# Patient Record
Sex: Female | Born: 1940 | Race: Black or African American | Hispanic: No | Marital: Married | Smoking: Former smoker
Health system: Southern US, Community
[De-identification: ages and names within clinical notes are randomized; demographics above are authoritative.]

## PROBLEM LIST (undated history)

## (undated) DIAGNOSIS — K5903 Drug induced constipation: Secondary | ICD-10-CM

## (undated) DIAGNOSIS — R768 Other specified abnormal immunological findings in serum: Secondary | ICD-10-CM

## (undated) DIAGNOSIS — J8410 Pulmonary fibrosis, unspecified: Secondary | ICD-10-CM

## (undated) DIAGNOSIS — K828 Other specified diseases of gallbladder: Secondary | ICD-10-CM

## (undated) DIAGNOSIS — I10 Essential (primary) hypertension: Secondary | ICD-10-CM

## (undated) DIAGNOSIS — J302 Other seasonal allergic rhinitis: Secondary | ICD-10-CM

## (undated) DIAGNOSIS — J210 Acute bronchiolitis due to respiratory syncytial virus: Secondary | ICD-10-CM

## (undated) DIAGNOSIS — K219 Gastro-esophageal reflux disease without esophagitis: Secondary | ICD-10-CM

## (undated) DIAGNOSIS — E785 Hyperlipidemia, unspecified: Secondary | ICD-10-CM

## (undated) DIAGNOSIS — E119 Type 2 diabetes mellitus without complications: Secondary | ICD-10-CM

## (undated) HISTORY — PX: COLONOSCOPY: SHX174

## (undated) HISTORY — PX: ABDOMINAL HYSTERECTOMY: SHX81

## (undated) HISTORY — PX: SMALL INTESTINE SURGERY: SHX150

## (undated) HISTORY — DX: Essential (primary) hypertension: I10

## (undated) HISTORY — PX: HERNIA REPAIR: SHX51

## (undated) HISTORY — DX: Type 2 diabetes mellitus without complications: E11.9

---

## 1997-05-26 ENCOUNTER — Encounter: Admission: RE | Admit: 1997-05-26 | Discharge: 1997-08-24 | Payer: Self-pay | Admitting: *Deleted

## 1998-07-21 ENCOUNTER — Ambulatory Visit (HOSPITAL_COMMUNITY): Admission: RE | Admit: 1998-07-21 | Discharge: 1998-07-21 | Payer: Self-pay | Admitting: Obstetrics and Gynecology

## 1998-11-23 ENCOUNTER — Other Ambulatory Visit: Admission: RE | Admit: 1998-11-23 | Discharge: 1998-11-23 | Payer: Self-pay | Admitting: *Deleted

## 1999-08-24 ENCOUNTER — Encounter: Payer: Self-pay | Admitting: Emergency Medicine

## 1999-08-24 ENCOUNTER — Emergency Department (HOSPITAL_COMMUNITY): Admission: EM | Admit: 1999-08-24 | Discharge: 1999-08-24 | Payer: Self-pay | Admitting: Emergency Medicine

## 2000-01-24 ENCOUNTER — Other Ambulatory Visit: Admission: RE | Admit: 2000-01-24 | Discharge: 2000-01-24 | Payer: Self-pay | Admitting: *Deleted

## 2001-01-06 ENCOUNTER — Other Ambulatory Visit: Admission: RE | Admit: 2001-01-06 | Discharge: 2001-01-06 | Payer: Self-pay | Admitting: *Deleted

## 2002-02-01 ENCOUNTER — Other Ambulatory Visit: Admission: RE | Admit: 2002-02-01 | Discharge: 2002-02-01 | Payer: Self-pay | Admitting: *Deleted

## 2003-03-15 ENCOUNTER — Ambulatory Visit (HOSPITAL_COMMUNITY): Admission: RE | Admit: 2003-03-15 | Discharge: 2003-03-15 | Payer: Self-pay | Admitting: Gastroenterology

## 2003-08-08 ENCOUNTER — Ambulatory Visit (HOSPITAL_COMMUNITY): Admission: RE | Admit: 2003-08-08 | Discharge: 2003-08-08 | Payer: Self-pay | Admitting: Gastroenterology

## 2004-08-21 ENCOUNTER — Emergency Department (HOSPITAL_COMMUNITY): Admission: EM | Admit: 2004-08-21 | Discharge: 2004-08-21 | Payer: Self-pay | Admitting: Emergency Medicine

## 2005-01-07 ENCOUNTER — Ambulatory Visit (HOSPITAL_COMMUNITY): Admission: RE | Admit: 2005-01-07 | Discharge: 2005-01-07 | Payer: Self-pay | Admitting: Gastroenterology

## 2005-03-07 ENCOUNTER — Ambulatory Visit (HOSPITAL_BASED_OUTPATIENT_CLINIC_OR_DEPARTMENT_OTHER): Admission: RE | Admit: 2005-03-07 | Discharge: 2005-03-07 | Payer: Self-pay | Admitting: Orthopedic Surgery

## 2005-06-26 ENCOUNTER — Encounter: Payer: Self-pay | Admitting: Obstetrics and Gynecology

## 2006-04-07 ENCOUNTER — Emergency Department (HOSPITAL_COMMUNITY): Admission: EM | Admit: 2006-04-07 | Discharge: 2006-04-07 | Payer: Self-pay | Admitting: Emergency Medicine

## 2006-04-08 ENCOUNTER — Inpatient Hospital Stay (HOSPITAL_COMMUNITY): Admission: EM | Admit: 2006-04-08 | Discharge: 2006-04-27 | Payer: Self-pay | Admitting: Emergency Medicine

## 2006-08-20 ENCOUNTER — Ambulatory Visit (HOSPITAL_COMMUNITY): Admission: RE | Admit: 2006-08-20 | Discharge: 2006-08-20 | Payer: Self-pay | Admitting: Obstetrics & Gynecology

## 2006-09-23 ENCOUNTER — Emergency Department (HOSPITAL_COMMUNITY): Admission: EM | Admit: 2006-09-23 | Discharge: 2006-09-23 | Payer: Self-pay | Admitting: Emergency Medicine

## 2006-09-28 ENCOUNTER — Emergency Department (HOSPITAL_COMMUNITY): Admission: EM | Admit: 2006-09-28 | Discharge: 2006-09-28 | Payer: Self-pay | Admitting: Emergency Medicine

## 2007-05-21 ENCOUNTER — Encounter: Admission: RE | Admit: 2007-05-21 | Discharge: 2007-05-21 | Payer: Self-pay | Admitting: General Surgery

## 2007-09-08 ENCOUNTER — Encounter (INDEPENDENT_AMBULATORY_CARE_PROVIDER_SITE_OTHER): Payer: Self-pay | Admitting: General Surgery

## 2007-09-09 ENCOUNTER — Inpatient Hospital Stay (HOSPITAL_COMMUNITY): Admission: AD | Admit: 2007-09-09 | Discharge: 2007-09-10 | Payer: Self-pay | Admitting: General Surgery

## 2008-01-14 ENCOUNTER — Ambulatory Visit (HOSPITAL_COMMUNITY): Admission: RE | Admit: 2008-01-14 | Discharge: 2008-01-14 | Payer: Self-pay | Admitting: Gastroenterology

## 2008-01-20 ENCOUNTER — Emergency Department (HOSPITAL_COMMUNITY): Admission: EM | Admit: 2008-01-20 | Discharge: 2008-01-20 | Payer: Self-pay | Admitting: Emergency Medicine

## 2009-12-01 ENCOUNTER — Encounter: Admission: RE | Admit: 2009-12-01 | Discharge: 2009-12-01 | Payer: Self-pay | Admitting: Sports Medicine

## 2010-01-12 ENCOUNTER — Encounter: Payer: Self-pay | Admitting: Internal Medicine

## 2010-01-12 ENCOUNTER — Ambulatory Visit: Payer: Self-pay | Admitting: Internal Medicine

## 2010-01-12 DIAGNOSIS — R05 Cough: Secondary | ICD-10-CM

## 2010-01-15 ENCOUNTER — Inpatient Hospital Stay (HOSPITAL_COMMUNITY)
Admission: RE | Admit: 2010-01-15 | Discharge: 2010-01-18 | Payer: Self-pay | Source: Home / Self Care | Admitting: General Surgery

## 2010-01-16 DIAGNOSIS — I10 Essential (primary) hypertension: Secondary | ICD-10-CM

## 2010-01-16 DIAGNOSIS — E118 Type 2 diabetes mellitus with unspecified complications: Secondary | ICD-10-CM | POA: Insufficient documentation

## 2010-01-21 ENCOUNTER — Inpatient Hospital Stay (HOSPITAL_COMMUNITY): Admission: EM | Admit: 2010-01-21 | Discharge: 2010-01-26 | Payer: Self-pay | Admitting: Emergency Medicine

## 2010-03-18 ENCOUNTER — Encounter: Payer: Self-pay | Admitting: Obstetrics & Gynecology

## 2010-03-29 NOTE — Assessment & Plan Note (Signed)
Summary: CHRONIC COUGH X 6 MONTHS //KP   Primary Kim [name]/Referring Kim [name]:  Pati [name], MD  CC:  Pulmonary Consult-cough; Dr.Kramer.  History of Present Illness: January 12, 2010- 70 yoF referred courtesy of Dr Kim [name] because of persistent cough. She describes this as bothersome for "2 weeks or longer", but on review of Dr Kim [name] notes, he described duration as 6 months or more. An office note from March indicates recent congestion and cough then. She describes cough as intermittent but persistent, with no recognized trigger. Nothing makes it worse.. Denies sputum, dyspnea, chest pain, wheeze. It rarely wakes her. Cough drops help some.  Had some asthma as a child- not severe. Smoked over 30 pack years, stopping in 1996. Spirometry in office today- variable effort. FVC 1.7/ 60%; FEV1/ 64%; FEV1/FVC 0.82 Moderate Restriction.  Preventive Screening-Counseling & Management  Alcohol-Tobacco     Smoking Status: quit     Packs/Day: 1.0     Year Started: 1960     Year Quit: 1996  Current Medications (verified): 1)  Hyzaar 100-12.5 Mg Tabs (Losartan Potassium-Hctz) .... Take 1 By Mouth Once Daily 2)  Metformin Hcl 500 Mg Tabs (Metformin Hcl) .... Take 1 By Mouth Two Times A Day 3)  Fish Oil 1000 Mg Caps (Omega-3 Fatty Acids) .... Take 1 By Mouth Two Times A Day 4)  Vitamin D3 1000 Unit Caps (Cholecalciferol) .... Take 1 By Mouth Two Times A Day 5)  Gas Relief Extra Strength 125 Mg Caps (Simethicone) .... Take 1 By Mouth Once Daily As Needed 6)  Clearlax  Powd (Polyethylene Glycol 3350) .Marland Kitchen.. 1 Capful in H2o/beverage Once Daily  Allergies (verified): 1)  ! Codeine 2)  ! Pcn  Past History:  Past Medical History: Cough Diabetes, Type 2 Hypertension abdominal wall hernias  Past Surgical History: bowel obstruction  2008 Total Abdominal Hysterectomy  Family History: Family hx: heart disease and asthma Father- died- drowned Mother- died-? 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  Smoking Status:  quit Packs/Day:  1.0  Review of Systems       The patient complains of weight change.  The patient denies shortness of breath with activity, shortness of breath at rest, productive cough, non-productive cough, coughing up blood, chest pain, irregular heartbeats, acid heartburn, indigestion, loss of appetite, abdominal pain, difficulty swallowing, sore throat, tooth/dental problems, headaches, nasal congestion/difficulty breathing through nose, sneezing, itching, ear ache, anxiety, depression, hand/feet swelling, joint stiffness or pain, rash, change in color of mucus, and fever.    Vital Signs:  Patient profile:   70 year old female Height:      68 inches Weight:      189.38 pounds BMI:     28.90 O2 Sat:      92 % on Room air Pulse rate:   81 / minute BP sitting:   140 / 76  (left arm) Cuff size:   regular  Vitals Entered By: Reynaldo Minium CMA (January 12, 2010 3:19 PM)  O2 Flow:  Room air CC: Pulmonary Consult-cough; Dr.Kramer   Physical Exam  Additional Exam:  General: A/Ox3; pleasant and cooperative, NAD, room air O2 sat 92% SKIN: no rash, lesions NODES: no lymphadenopathy HEENT: [place]/AT, EOM- WNL, Conjuctivae- clear, PERRLA, TM-WNL, Nose- clear, Throat- clear and wnl, own teeth, Mallampati  II NECK: Supple w/ fair ROM, JVD- none, normal carotid impulses w/o bruits Thyroid- normal to palpation CHEST: Dull in bases, poor airflow, unlabored, no cough HEART: RRR,  no m/g/r heard ABDOMEN:  ZOX:WRUE, nl pulses, no edema, cyanosis or clubbing  NEURO: Grossly intact to observation      Impression & Recommendations:  Problem # 1:  COUGH (ICD-786.2)  Significant past smoking hx. On exam she seems quite dull in bases, but I don't know if this might reflect her several abdominal hernia surgeries. We will get office spirometry and CXR this afternoon, then let her go with  a rescue inhaler to try, returning in 2 weeks for f/u.  She seems comfortable now, and she is not actively coughing. The usual first concerns with chronic cough are postnasal drip, reflux or aspiration, intrinsic lung disease including COPD or other tobacco related disease in this fomer heavy smoker. Cough does not seem to have progressed over recent months, but I note she is vague about how long it [name] been going on.   Medications Added to Medication List This Visit: 1)  Hyzaar 100-12.5 Mg Tabs (Losartan potassium-hctz) .... Take 1 by mouth once daily 2)  Metformin Hcl 500 Mg Tabs (Metformin hcl) .... Take 1 by mouth two times a day 3)  Fish Oil 1000 Mg Caps (Omega-3 fatty acids) .... Take 1 by mouth two times a day 4)  Vitamin D3 1000 Unit Caps (Cholecalciferol) .... Take 1 by mouth two times a day 5)  Gas Relief Extra Strength 125 Mg Caps (Simethicone) .... Take 1 by mouth once daily as needed 6)  Clearlax Powd (Polyethylene glycol 3350) .Marland Kitchen.. 1 capful in h2o/beverage once daily  Other Orders: Consultation Level IV (45409) Spirometry w/Graph (94010) T-2 View CXR (71020TC)  Patient Instructions: 1)  Please schedule a follow-up appointment in 2 weeks. 2)  CC Dr Kim [name] 3)  A chest x-ray [name] been recommended.  Your imaging study may require preauthorization.  4)  Office spirometry 5)  Sample rescue inhaler for trial this weekend to see if it helps your cough 6)  2 puffs, up to 4 x daily if needed 7)  You can try an otc cough syrup, like Delsym 8)  ................................................................................. 9)  DG CHEST 2 VIEW - [id number] 10)    11)  Clinical Data: Shortness of breath, cough. 12)    13)  CHEST - 2 VIEW 14)    15)  Comparison: 09/08/2007 and 04/19/2006.CT abdomen pelvis 12/01/2009. 16)    17)  Findings: Trachea is midline.  Heart size normal.  There is a 18)  peripheral pattern of increased density and coarsening, similar to 19)  09/08/2007.  No  pleural fluid. 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 25)    26)  Read By:  Reyes Ivan.,  M.D.     27)  Released By:  Reyes Ivan.,  M.D. 28)  ______________

## 2010-05-08 LAB — BASIC METABOLIC PANEL
BUN: 3 mg/dL — ABNORMAL LOW (ref 6–23)
BUN: 4 mg/dL — ABNORMAL LOW (ref 6–23)
BUN: 4 mg/dL — ABNORMAL LOW (ref 6–23)
CO2: 28 mEq/L (ref 19–32)
CO2: 30 mEq/L (ref 19–32)
Calcium: 8.3 mg/dL — ABNORMAL LOW (ref 8.4–10.5)
Calcium: 8.4 mg/dL (ref 8.4–10.5)
Chloride: 102 mEq/L (ref 96–112)
Chloride: 110 mEq/L (ref 96–112)
Creatinine, Ser: 0.76 mg/dL (ref 0.4–1.2)
Creatinine, Ser: 0.79 mg/dL (ref 0.4–1.2)
Creatinine, Ser: 0.84 mg/dL (ref 0.4–1.2)
Creatinine, Ser: 0.86 mg/dL (ref 0.4–1.2)
GFR calc Af Amer: >60 mL/min (ref >60)
GFR calc Af Amer: >60 mL/min (ref >60)
GFR calc non Af Amer: >60 mL/min (ref >60)
GFR calc non Af Amer: >60 mL/min (ref >60)
GFR calc non Af Amer: >60 mL/min (ref >60)
Glucose, Bld: 138 mg/dL — ABNORMAL HIGH (ref 70–99)
Potassium: 3.7 mEq/L (ref 3.5–5.1)
Potassium: 4.2 mEq/L (ref 3.5–5.1)
Sodium: 136 mEq/L (ref 135–145)
Sodium: 137 mEq/L (ref 135–145)

## 2010-05-08 LAB — DIFFERENTIAL
Basophils Absolute: 0 10*3/uL (ref 0.0–0.1)
Basophils Absolute: 0.1 10*3/uL (ref 0.0–0.1)
Basophils Absolute: 0.1 10*3/uL (ref 0.0–0.1)
Basophils Relative: 0 % (ref 0–1)
Lymphocytes Relative: 26 % (ref 12–46)
Lymphocytes Relative: 29 % (ref 12–46)
Lymphocytes Relative: 40 % (ref 12–46)
Monocytes Absolute: 0.9 10*3/uL (ref 0.1–1.0)
Neutro Abs: 6.9 10*3/uL (ref 1.7–7.7)
Neutro Abs: 9 10*3/uL — ABNORMAL HIGH (ref 1.7–7.7)
Neutrophils Relative %: 50 % (ref 43–77)

## 2010-05-08 LAB — CBC
MCH: 26 pg (ref 26.0–34.0)
MCH: 26.4 pg (ref 26.0–34.0)
MCH: 26.7 pg (ref 26.0–34.0)
MCHC: 32.9 g/dL (ref 30.0–36.0)
MCV: 79.4 fL (ref 78.0–100.0)
MCV: 80.3 fL (ref 78.0–100.0)
MCV: 80.9 fL (ref 78.0–100.0)
Platelets: 292 10*3/uL (ref 150–400)
Platelets: 404 10*3/uL — ABNORMAL HIGH (ref 150–400)
Platelets: 406 10*3/uL — ABNORMAL HIGH (ref 150–400)
Platelets: 431 10*3/uL — ABNORMAL HIGH (ref 150–400)
RBC: 3.94 MIL/uL (ref 3.87–5.11)
RBC: 4.26 MIL/uL (ref 3.87–5.11)
RBC: 4.74 MIL/uL (ref 3.87–5.11)
RBC: 4.99 MIL/uL (ref 3.87–5.11)
RDW: 14.5 % (ref 11.5–15.5)
RDW: 14.8 % (ref 11.5–15.5)
RDW: 14.8 % (ref 11.5–15.5)
RDW: 14.8 % (ref 11.5–15.5)
WBC: 10 10*3/uL (ref 4.0–10.5)
WBC: 11.4 10*3/uL — ABNORMAL HIGH (ref 4.0–10.5)
WBC: 11.8 10*3/uL — ABNORMAL HIGH (ref 4.0–10.5)
WBC: 9.2 10*3/uL (ref 4.0–10.5)

## 2010-05-08 LAB — GLUCOSE, CAPILLARY
Glucose-Capillary: 111 mg/dL — ABNORMAL HIGH (ref 70–99)
Glucose-Capillary: 125 mg/dL — ABNORMAL HIGH (ref 70–99)
Glucose-Capillary: 127 mg/dL — ABNORMAL HIGH (ref 70–99)
Glucose-Capillary: 137 mg/dL — ABNORMAL HIGH (ref 70–99)
Glucose-Capillary: 141 mg/dL — ABNORMAL HIGH (ref 70–99)
Glucose-Capillary: 143 mg/dL — ABNORMAL HIGH (ref 70–99)
Glucose-Capillary: 98 mg/dL (ref 70–99)

## 2010-05-08 LAB — COMPREHENSIVE METABOLIC PANEL
AST: 17 U/L (ref 0–37)
Albumin: 3.4 g/dL — ABNORMAL LOW (ref 3.5–5.2)
Albumin: 3.8 g/dL (ref 3.5–5.2)
Alkaline Phosphatase: 60 U/L (ref 39–117)
BUN: 7 mg/dL (ref 6–23)
BUN: 9 mg/dL (ref 6–23)
CO2: 30 mEq/L (ref 19–32)
Chloride: 101 mEq/L (ref 96–112)
Chloride: 99 mEq/L (ref 96–112)
Creatinine, Ser: 0.88 mg/dL (ref 0.4–1.2)
Creatinine, Ser: 0.91 mg/dL (ref 0.4–1.2)
GFR calc non Af Amer: >60 mL/min (ref >60)
Glucose, Bld: 93 mg/dL (ref 70–99)
Sodium: 139 mEq/L (ref 135–145)
Total Bilirubin: 0.4 mg/dL (ref 0.3–1.2)
Total Bilirubin: 1.1 mg/dL (ref 0.3–1.2)
Total Protein: 7.6 g/dL (ref 6.0–8.3)

## 2010-05-08 LAB — URINE MICROSCOPIC-ADD ON

## 2010-05-08 LAB — URINALYSIS, ROUTINE W REFLEX MICROSCOPIC
Bilirubin Urine: NEGATIVE
Nitrite: NEGATIVE
Specific Gravity, Urine: 1.017 (ref 1.005–1.030)
pH: 7.5 (ref 5.0–8.0)

## 2010-05-08 LAB — SURGICAL PCR SCREEN: Staphylococcus aureus: NEGATIVE

## 2010-07-10 NOTE — Op Note (Signed)
**Note Kim via Obfuscation** [name], [name]              ACCOUNT NO.:  [id number]   MEDICAL RECORD NO.:  [id number]          PATIENT TYPE:  OIB   LOCATION:  0098                         FACILITY:  [facility]   PHYSICIAN:  Anselm Pancoast. Weatherly, M.D.DATE OF BIRTH:  June 10, 1940   DATE OF PROCEDURE:  09/08/2007  DATE OF DISCHARGE:                               OPERATIVE REPORT   PREOPERATIVE DIAGNOSIS:  Incisional hernia.   POSTOPERATIVE DIAGNOSIS:  Incisional hernia.   OPERATION:  Repair of incisional hernia with Proceed mesh.   ANESTHESIA:  General anesthesia.   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  She was seen in the  office on August 11, 2007, and said for about 3-4 months she had noticed a  little discomfort in the upper abdomen and a swelling of the upper part  of her incision.  She had been operated through a midline incision.  The  bottom appeared to be intact.  She had, had a CT back in April that did  not show any problem with the exception of an incisional hernia.  She  recently had been visiting in [place] with a lot of car riding, and said  her symptoms were much more prominent when she was constipated.  She did  not have any blood in her stool.  She has an obvious bulge in the upper  midportion of her incision, and I think we need proceed on with repair  this with mesh if possible.  Preoperatively, she was given 1 gm of  Ancef.  Her laboratory studies were normal.   She had PAS stockings and after induction of general anesthesia, a Foley  catheter was inserted.  I prepped the abdomen and draped in a sterile  manner.  Upon opening the midline incision, there was about a teaspoon  of kind of whitish fluid.  There was no odor.  We got a Gram stain of it  and sent if for aerobic and also a stat Gram stain, and there was no  white cells and there  was no bacteria noted.  I went ahead and proceeded  on with the dissection, opening into the peritoneal cavity.  There were  adhesions to the undersurface of this area.  There was a lemon sized  hernia defect that was separated, and then there was a couple little  defects around the umbilicus and one just below the umbilicus that were  not appreciated on physical exam.  With the Gram stain not showing any  bacteria or white cells, I think it will be alright to go ahead and put  a piece of Proceed mesh, and I used the 8 x 15 cm size sutured up under  the fascia circumferentially with interrupted sutures of 0 Prolene, and  then closed the fascia over the mesh, picking up a few fibers of the  mesh in the midportion.  This was done with #1 Novofil, and I also used  #1 Prolene interrupted.  I had tried to get good hemostasis during the  dissection.  I did put in a 10 mL Blake drain and closed the  subcutaneous tissue with 2-0 Vicryl and then the skin closed with  staples.  The patient tolerated the procedure nicely.  I am going to  keep her n.p.o. today except for ice chips since she has a history of  getting nauseous, and then hopefully will be able to start her on a  liquid diet tomorrow.  The sponge and needle counts were correct, and  the patient was awake in the recovery room at the time of this  dictation.          ______________________________  Anselm Pancoast. Zachery Dakins, M.D.    WJW/MEDQ  D:  09/08/2007  T:  09/08/2007  Job:  [id number]

## 2010-07-10 NOTE — Discharge Summary (Signed)
[name], [name]              ACCOUNT NO.:  [id number]   MEDICAL RECORD NO.:  [id number]          PATIENT TYPE:  INP   LOCATION:  5706                         FACILITY:  [facility]   PHYSICIAN:  Leonie Man, M.D.   DATE OF BIRTH:  11-21-1940   DATE OF ADMISSION:  04/08/2006  DATE OF DISCHARGE:  04/27/2006                               DISCHARGE SUMMARY   OPERATIVE PHYSICIAN:  Anselm Pancoast. Zachery Dakins, M.D.   PRIMARY CARE PHYSICIAN:  Estell Harpin, M.D.   CHIEF COMPLAINT:  Abdominal pain with nausea and vomiting.   HISTORY OF PRESENT ILLNESS AND REASON FOR ADMISSION:  Kim [name] is a  70 year old female patient with a history of prior abdominal surgeries  who presented to the ER 24 hours prior with complaints of abdominal  pain, nausea and vomiting that awakened her from sleep.  Workup at that  time included ultrasound of the abdomen which showed no significant  abnormalities.  Her labs were normal.  A plain abdominal film obtained  at the same time showed nonspecific small bowel gas pattern with air in  the colon, so she was discharged home.  Unfortunately, her symptoms  continued through the night with increased pain and increasing nausea  and vomiting and unable to even keep liquids down.  She presented back  to the ER.  Subsequently, a CT scan was obtained, and this demonstrated  small-bowel obstruction in the right abdominal region with a small  amount of free fluid in the pelvis.   PHYSICAL EXAMINATION:  GENERAL:  The patient's vital signs were stable.  ABDOMEN:  Her abdomen was obese but tympanitic and distended with no  bowel sounds auscultated and diffuse mild tenderness without guarding or  rebounding.   LABORATORY DATA:  Within normal limits.  Her white count was 9700, but  her neutrophils were elevated at 80%.  Urinalysis was consistent with a  possible UTI.  Her x-rays and CT scans were reviewed, and it was felt  that the patient and did have symptomatic  small-bowel obstruction.   She was admitted with a following diagnoses.  1. Small bowel obstruction.  2. Volume depletion secondary to nausea and vomiting.  3. History of prior open abdominal surgeries 20 years prior.  4. Hypertension.   HOSPITAL COURSE:  The patient was admitted to the general floor where  she was placed on n.p.o. status, NG tube, bowel rest and IV fluid  hydration.  Twenty-four hours after admission, an x-ray was performed  that showed decrease in colonic gas and no change in small bowel  obstruction.  After much discussion with Dr. Zachery Dakins, it was felt that  the patient's bowel obstruction was actually worsening, and he felt she  would benefit from operative intervention.   On April 10, 2006, the patient was taken to the OR by Dr. Zachery Dakins  where she underwent exploratory laparotomy with lysis of adhesions for  small bowel obstruction.  She was sent back to the floor to recover.   After the first few days of surgery, the patient continued to have  symptoms consistent with a postoperative ileus.  She  had a mild  elevation in the white count to 13,000; otherwise, labs were stable.  Her abdomen was soft and distended, but no bowel sounds were present.  She was tolerating her PCA morphine, and her wound was clean, dry and  intact.  The NG continued with thick bilious returns.   On postoperative day #3, the patient had not been passing any flatus,  but during the evening her NG tube had been inadvertently pulled I think  by the patient, so it was left out.  By postoperative day #4, her  abdomen was distended, but she did have bowel sounds.  Her wound was  clean, dry and intact.  I felt that she may be finally opening up  postoperatively, so sips of clears were allowed and ambulation was  allowed.   By postoperative day #5, the patient was still not passing flatus, and  she began experiencing more nausea despite having bowel sounds.  She  continued to  ambulate.  Her abdomen was slightly softer, but she had  higher-pitched bowel sounds and again was not passing any flatus or  bowel movements.  By postoperative day #6, her plain films showed an  improvement of her small bowel obstruction with only a very few small  air-fluid levels in the left upper quadrant.  Her abdomen was soft.  She  had bowel sounds, but they were diminished, so it was felt that maybe  her postoperative ileus was improving, so a trial of full liquids were  initiated.  Unfortunately, she began having emesis, even though she was  passing stools.  On postoperative day #7 after the emesis, her bowel  sounds were much louder and active today.  Her wound staples were  discontinued.  A trial on soft diet was initiated, and she was restarted  on her home antihypertensive medications.  Over the next several days,  she continued to be equivocal in whether her ileus was resolving.  She  continued to have bowel sounds, but she also had some fullness in the  left lower quadrant.  She was tender.  She was tried on a Dulcolax  suppository, but continued to develop nausea and vomiting after attempts  to initiate diet.   By postoperative day #9, she really was not taking any meaningful  nutrition in, so a PICC line was placed and she was started on TNA.  Around this same time, the patient began to have difficulty with  hypokalemia, so IV repletion of potassium was initiated.  Obviously,  this would be influencing any postoperative ileus problems.   By postoperative day #11, she reported having small bowel movements the  day before.  She was complaining of being hungry.  She was stable.  An  NG tube had been in place with amber returns less than 500 in 24 hours.  The NG tube had been reinserted several days before, and I neglected to  mention that.  She continued to have signs consistent with a prolonged  postoperative ileus, so no attempts were made at this point to clamp the NG  tube or advance her diet.  Over the next several days, clinically and  x-ray-wise, she continued to improve.  By postoperative day #13, she had  marked decrease in small bowel air, and air fluid levels were decreased.  She also had some retained stool and contrast in the right colon.  By  this point, we attempted to clamp the NG tube again and allow sips of  clears.  Her  PCA was discontinued, and she was placed on p.r.n. Dilaudid  and low-dose Reglan IV and was also given another Dulcolax suppository.  By postoperative day #14, she was passing flatus and a bowel movement.  She was doing well, sitting up in the chair.  She was tolerating her NG  tube clamping.  Her abdomen was soft.  Bowel sounds were present.  Her  abdomen was less distended.  Her midline incision was clean, dry, and  intact with a tiny open area at the umbilical area which was  granulating, and she was still requiring TNA.  At this point, her NG  tube was discontinued and she was placed on a full clear liquid diet  with plans to advance her diet further in the morning if she tolerated.   By postoperative day #15, she was afebrile, vital signs were stable.  She was up in the chair, stated she felt much better.  Her abdomen was  soft, nondistended.  Bowel sounds were present.  She was advanced to a  soft diet.  Plans were to advance her to a soft diet in the morning if  she tolerated the full liquids that were started that morning.  She was  resumed on her oral pain pills, her normal dose of Avapro for  hypertension, and her TNA was being weaned.   By postoperative day #17, the patient was tolerating a regular diet, was  passing flatus and having bowel movements, was having no abdominal pain  and was otherwise deemed appropriate for discharge home.   FINAL DISCHARGE DIAGNOSES:  1. Small bowel obstruction secondary to adhesions.  2. Status post exploratory laparotomy with lysis of adhesions by Dr.      Zachery Dakins on  April 10, 2006.  3. Prolonged postoperative ileus, now resolved.  4. Protein calorie malnutrition on TNA.  5. Hypertension, stable.   DISCHARGE MEDICATIONS:  1. Vicodin as needed for pain.  See Dr. Danella Penton prescription.  2. Resume home medication of Micardis/HCTZ 80/12.5 one tablet daily.   DIET:  No restrictions.   WOUND CARE:  Daily showers.  Allow any Steri-Strips to fall off.   ACTIVITY:  Increase activity slowly.  No lifting for 6 weeks more than  15 pounds.   FOLLOWUP:  You need to call Dr. Annette Stable office at [phone] to  arrange an appointment in the next 2-4 weeks.   OTHER INSTRUCTIONS:  You are to call the doctor if your fever is greater  than 101 degrees Fahrenheit, any redness, swelling or drainage from your  incisions or any other problems.      Allison L. Rennis Harding, N.P.      Leonie Man, M.D.  Electronically Signed    ALE/MEDQ  D:  08/07/2006  T:  08/07/2006  Job:  [id number]   cc:   Estell Harpin, M.D.

## 2010-07-10 NOTE — Discharge Summary (Signed)
NAMETAMRAH, VICTORINO              ACCOUNT NO.:  [id number]   MEDICAL RECORD NO.:  [id number]          PATIENT TYPE:  INP   LOCATION:  1620                         [facility]ACILITY:  [facility]   PHYSICIAN:  Anselm Pancoast. Weatherly, M.D.DATE OF BIRTH:  November 15, 1940   DATE OF ADMISSION:  09/08/2007  DATE OF DISCHARGE:  09/10/2007                               DISCHARGE SUMMARY   DISCHARGE DIAGNOSES:  1. Incisional hernia.  2. History of hypertension.   HISTORY:  Kim [name] is a 70 year old female who I first met  approximately 8 months ago when she was admitted to the teaching service  and a surgical consultation was required and she had a small bowel  obstruction secondary to adhesions.  She was taken to the operating room  after failed management with NG suction, etc., and had a lysis of  adhesions and recently she has had increased swelling in the upper part  of her midline incision.  Her bowels have worked satisfactory and when I  saw her in the office in June, she said she had noticed a little bulge  occurred in about 3 months.  She has a history of mild hypertension and  pulmonary fibrosis.  She is not on home oxygen but the cough and etc.,  has made this worse.  The incision appeared to be having no evidence of  any inflammatory type problem and I recommended we repaired this with  mesh.  She was admitted on the 14th for this planned procedure.  Her  laboratory studies preoperatively showed a white count of 7600,  hematocrit was 36, her electrolytes were normal and a chest x-ray showed  pulmonary fibrosis.  She was taken to surgery and the upper half of her  incision had stretched so that the fascia had separated about 3 cm in a  hernia sac.  I was able to free up the bowel not really requiring a full  lysis of adhesions but able to get a piece of Proceed mesh under the  fascia and enclosed.  At the time of the incision opened, there was a  little area in the subcutaneous tissue that had  kind of liquefied fluid.  It had no odor.  I got a culture of this and sent it for stat Gram stain  that showed no fluid and it looked like it was a small area of fat  necrosis probably from her previous incision.  I elected to go ahead and  proceed with a Proceed mesh since there was no organism seen and now at  24 hours there is still no organisms noted on the growth.  Her incisions  healing nicely.  Had a little mild Blake drain in the incision but it  has had minimal drainage and I have removed the drain.  I think she can  be discharged at this time as she is tolerating a diet, incision looks  good and she will gradually advance her liquid diet over the next 48  hours and use Milk of Magnesia.  She had a bowel prep preoperatively so  that there is very little stool in her  colon that this time.  I am going  to discharge on Keflex 5 mg q.6 hours for 5 days and we will see her in  the office middle of next week.  She has got an abdominal binder she is  wearing and will wait approximately 48 hours before any shower.  She  continues her blood pressure medications.           ______________________________  Anselm Pancoast. Zachery Dakins, M.D.    WJW/MEDQ  D:  09/10/2007  T:  09/10/2007  Job:  [id number]   cc:   Anselm Pancoast. Zachery Dakins, M.D.  [address], [address]  [place]  [place] [address]

## 2010-07-13 NOTE — Op Note (Signed)
NAME:  Kim [name], Kim [name]                        ACCOUNT NO.:  [id number]   MEDICAL RECORD NO.:  [id number]                   PATIENT TYPE:  AMB   LOCATION:  ENDO                                 FACILITY:  [facility]   PHYSICIAN:  John C. Madilyn Fireman, M.D.                 DATE OF BIRTH:  October 05, 1940   DATE OF PROCEDURE:  03/15/2003  DATE OF DISCHARGE:                                 OPERATIVE REPORT   PROCEDURE:  Colonoscopy.   INDICATION FOR PROCEDURE:  Average risk colon cancer screening.   DESCRIPTION OF PROCEDURE:  The patient was placed in the left lateral  decubitus position and placed on the pulse monitor with continuous low-flow  oxygen delivered by nasal cannula.  She was sedated with 75 mcg IV fentanyl  and 8 mg IV Versed.  The Olympus video colonoscope was inserted into the  rectum and advanced its entire length but despite multiple abdominal  pressure and maneuvers, torquing maneuvers, and change in position, I was  unable to reach the cecum.  It was not certain but felt that the point of  most proximal advancement was probably somewhere in the mid transverse  colon.  This portion of the colon as well as the descending, sigmoid, and  rectum appeared normal with no masses, polyps, diverticula, or other mucosal  abnormalities.  The rectum, likewise appeared normal, and retroflexed view  of the anus revealed no obvious internal hemorrhoids.  The scope was then  withdrawn, and the patient returned to the recovery room in stable  condition.  She tolerated the procedure well, and there were no immediate  complications.   IMPRESSION:  Normal limited exam to the estimated mid transverse colon.   PLAN:  We will follow up with barium enema.                                               John C. Madilyn Fireman, M.D.    JCH/MEDQ  D:  03/15/2003  T:  03/15/2003  Job:  [id number]   cc:   Estell Harpin, M.D.  [address]  [place] [address]  Fax: [phone]

## 2010-07-13 NOTE — Op Note (Signed)
NAMECLARENCE, Kim [name]              ACCOUNT NO.:  [id number]   MEDICAL RECORD NO.:  [id number]          PATIENT TYPE:  AMB   LOCATION:  DSC                          FACILITY:  [facility]   PHYSICIAN:  Loreta Ave, M.D. 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   OPERATION PERFORMED:  Correction of hallux valgus and metatarsus prima vara  with removal of exostosis and distal first metatarsal chevron osteotomy,  fixation with 2 mm bioabsorbable OrthoSorb pin.  Medial capsular reefing.  Debridement MP joint.   SURGEON:  Loreta Ave, M.D.   ANESTHESIA:  General.   ESTIMATED BLOOD LOSS:  Minimal.   TOURNIQUET TIME:  50 minutes.   SPECIMENS:  None.   CULTURES:  None.   COMPLICATIONS:  None.   DRESSING:  Soft compressive with wooden shoes.   DESCRIPTION OF PROCEDURE:  The patient was brought to the operating room and  after adequate anesthesia had been obtained, tourniquet applied to left  calf.  Prepped and draped in the usual sterile fashion.  Exsanguinated with  elevation and Esmarch.  Tourniquet inflated to 250 mmHg.  Longitudinal  incision in the medial aspect, first MP joint.  Skin and subcutaneous tissue  divided.  Distal based capsular flap developed, exposing exostosis in the  metacarpophalangeal joint.  Grade 2, mild grade 3 changes.  Joint debrided.  Exostosis removed with a saw with fluoroscopic guidance.  Distal chevron  osteotomy created in the metaphyseal region.  Distal aspect of the  metatarsal slid laterally 5 mm, fixed with a bioabsorbable 2 mm pin from  dorsal to  plantar.  Excellent correction.  Remaining prominent shaft tapered  smoothly.  Overall correction excellent, confirmed visually and  fluoroscopically.  Wound  irrigated.  Medial capsule was reefed with interrupted 0 Vicryl.  Wound  irrigated and closed with nylon.  Sterile compressive dressing with bolster  between first and second toes applied.  Ace wrap applied.  Tourniquet  deflated.  Anesthesia reversed.  Brought to recovery room.  Tolerated  surgery well without complication.      Loreta Ave, M.D.  Electronically Signed     DFM/MEDQ  D:  03/07/2005  T:  03/08/2005  Job:  [id number]

## 2010-07-13 NOTE — Op Note (Signed)
Kim [name], Kim [name]              ACCOUNT NO.:  [id number]   MEDICAL RECORD NO.:  [id number]          PATIENT TYPE:  INP   LOCATION:  5727                         FACILITY:  [facility]   PHYSICIAN:  Anselm Pancoast. Weatherly, M.D.DATE OF BIRTH:  1940/11/27   DATE OF PROCEDURE:  04/10/2006  DATE OF DISCHARGE:                               OPERATIVE REPORT   PREOPERATIVE DIAGNOSIS:  Small bowel obstruction probably secondary to  adhesions.   POSTOPERATIVE DIAGNOSIS:  Small-bowel obstruction secondary to adhesions  from previous abdominal hysterectomy   ANESTHESIA:  General anesthesia.   OPERATION:  Exploratory laparotomy with lysis of adhesions for small  bowel obstruction.   SURGEON:  Anselm Pancoast. Zachery Dakins, M.D.   ASSISTANT:  Gabrielle Dare. Janee Morn, M.D.   HISTORY:  Kim [name] is a 70 year old female who was admitted 2 days  ago through the emergency room with a several-day history of bloating  cramping.  She had had abdominal hysterectomy secondary to cervical  cancer approximately 20 years ago and on the April 07, 2006, she  presented with nausea, vomiting and ultrasound in the emergency room  showed no evidence of any gallstones since her pain was more in the  upper right side.  She was discharged, continued to have nausea and  vomiting and then returned the following day, April 08, 2006, and  general surgical consult was requested and we saw the patient.  She has  a past history of hypertension and she has had a colonoscopy in 2005, by  Dr. Dorena Cookey and on examination she was not acutely ill, but kind of  gave pain in the upper right abdomen and on the CT she definitely did  have some dilated loops of small bowel in this location but it was not  that of an obvious go to the OR now type of obstruction.  NG tube was  placed and this is working satisfactory with bilious NG drainage.  She  was not more distended the following day but still having bilious NG  drainage and then  this morning a repeat x-ray still showed the dilated  loop of small bowel in the right upper quadrant and she, to me, appeared  to be actually having a little more pain.  I recommended we go ahead and  proceed with laparotomy and the patient was in agreement.   Preoperatively, she is allergic to penicillin, she was given 4 mg Cipro  taken to the operative suite, has PAS stockings.  The abdomen was  prepped with Betadine solution and draped in a sterile manner after the  Foley catheter was inserted sterilely and then draped in a sterile  manner.  A made a small incision up above the umbilicus.  She had had a  lower midline incision up to above the umbilicus and she is fairly short  and heavy and appears to be a little larger than her weight of about 200  pounds.  I carefully opened into the peritoneal cavity and the small  bowel was adherent to the anterior abdominal wall as high as the  umbilicus and it looked as if there was  no omentum to kind of go under  the midline incision with her hysterectomy had been done.  We extended  the incision up and also down since you really could not tell exactly  where the track point was going to be and there was a little area of  bowel that was very densely adherent right above the umbilicus which  would have been right at the top part of her incision.  However, this  did not appear to be the actual point of blockage and we started lysis  of adhesions.  There were numerous areas and there was this dilated loop  of bowel that was mildly congested more in the right upper quadrant that  has been seen on the x-rays and this lead down into the pelvis.  The  patient's incision was then extended inferior so we could actually  approached the adhesions in the pelvis and there were two areas that  possibly could have been the point, there was an area to the right up  above the actual cecum and terminal ileum that was densely adherent and  this was where the loop  of bowel that was very congested actually led to  before it looped back up into the abdomen and then the most terminal  ileum actually drops down into the pelvis and freeing up this particular  loop was very difficult but questioned whether she could possibly have  had a little obturator hernia or whatever and we went ahead and, with  tedious dissection, actually freed that area up but I do not think that  that was the actual point of obstruction.  I think the area that was  right above it was the point that was given the clinical findings.  Then  with this freed up, you could run the bowel from the cecum to the  ligament of Treitz.  There was no other areas of dense adhesions and  everything was completely freed up.  I then, with a retractor in the  upper abdomen, freed up, it appeared that the omentum was stuck to the  undersurface of the liver as if probably when they were doing they  hysterectomy they had had her head down and it just stayed in this  location and this was freed up.  You could see the gallbladder which was  decompressed and no stones, then the omentum would come down over all of  the small bowel.  We then ran the small bowel again and placed it back  in anatomical position, starting at the distally end kind of working up  to the upper abdomen and then brought the omentum over it.  We then  closed the incision with a double looped #1 PDS and tying the two ends  together in the midline.  The skin was closed with staples.  She has got  about 2 inches of adipose tissue and the NG tube, we had checked its  position and it is in the stomach and working nicely.  She will be  continued on NG suction and IV fluids post anesthesia and will give her  antibiotics for approximately 2 days and hopefully her bowels will start  working, but I am sure it will be in several days since there was a lot  of manipulation in freeing up these chronic pelvic adhesions.           ______________________________  Anselm Pancoast. Zachery Dakins, M.D.     WJW/MEDQ  D:  04/10/2006  T:  04/10/2006  Job:  [id number]

## 2010-07-13 NOTE — H&P (Signed)
Kim [name], Kim [name]              ACCOUNT NO.:  [id number]   MEDICAL RECORD NO.:  [id number]          PATIENT TYPE:  INP   LOCATION:  1827                         FACILITY:  [facility]   PHYSICIAN:  Anselm Pancoast. Weatherly, M.D.DATE OF BIRTH:  Aug 06, 1940   DATE OF ADMISSION:  04/08/2006  DATE OF DISCHARGE:                              HISTORY & PHYSICAL   Primary care physician is Dr. Pati [name].   CHIEF COMPLAINT:  Abdominal pain with associated nausea and vomiting.   HISTORY OF PRESENT ILLNESS:  Kim [name] is a 70 year old female  patient, prior history of exploratory laparotomy for a total abdominal  hysterectomy secondary to cervical cancer 20 years ago.  She presented  to the ER on April 07, 2006, with complaints of abdominal pain and  nausea and vomiting that woke her up at 1 a.m. on the 11th.  Ultrasound  done in the ER showed no significant abnormalities to explain her pain  and nausea and vomiting.  Her labs were normal.  Plain abdominal film  showed a nonspecific bowel gas pattern but also noted there was air in  the colon.  The patient was eventually discharged home.  Unfortunately,  her symptoms continued throughout the night, increasing pain, increasing  nausea and vomiting, unable to keep any liquids down.  She presented  back to the ER.  Once again labs were normal but a CT scan was obtained,  and this showed a small bowel obstruction mainly in the right abdominal  region, also a small amount of fluid in the right pelvis.  She is still  very nauseated.  Because of these findings a surgical admission has been  requested.   REVIEW OF SYSTEMS:  The patient is reporting chills.  No fevers.  Significant nausea and vomiting over the past 48 hours.  No BM for 2  days.  No suprapubic pain.  No dysuria.  She has never had any symptoms  like this in the past.   SOCIAL HISTORY:  No alcohol.  No tobacco.  She is retired.   FAMILY HISTORY:  Gallstones.   PAST MEDICAL  HISTORY:  Hypertension.   PAST SURGICAL HISTORY:  1. Total abdominal hysterectomy due to cervical cancer 20 years ago.  2. Foot surgery in 2007.  3. Colonoscopy which was normal in  2005 by Dr. Madilyn Fireman.   ALLERGIES:  CODEINE and PENICILLIN, which causes a rash.   CURRENT MEDICATIONS:  She is all one blood pressure pill.  She cannot  recall the name of it.   PHYSICAL EXAMINATION:  GENERAL:  A pleasant female patient complaining  of significant nausea and abdominal distension.  VITAL SIGNS:  Temperature 97.1, BP 136/72, pulse 97 and regular,  respirations 20.  NEUROLOGIC:  The patient is alert and oriented x3, moving all  extremities x4, without focal deficits.  HEENT: Head normocephalic.  Sclerae are noninjected.  NECK:  Supple.  No adenopathy.  CHEST:  Bilateral lung sounds clear to auscultation.  Respiratory effort  nonlabored.  She is on room air.  CARDIAC:  S1-S2.  No rubs, murmurs, thrills or gallops.  IV fluids are  infusing.  ABDOMEN:  Obese but tympanitic and distended.  There are no bowel sounds  auscultated.  She has diffuse mild tenderness without guarding or  rebounding.  EXTREMITIES:  Symmetrical in appearance without edema, cyanosis or  clubbing.  Pulses are palpable.   LABORATORY DATA:  Sodium 136, potassium 4.1, CO2 30, BUN 15, creatinine  0.9, glucose 169.  LFTs are normal.  Amylase and lipase are normal.  White count 9700, neutrophils 80%, hemoglobin 15.7, platelets 347,000.  Urinalysis shows few epithelials, leukocyte esterase is negative, wbc's  3-6, rbc's 0-2, bacteria many.   DIAGNOSTICS:  Plain abdominal x-ray April 07, 2006:  Nonspecific  bowel gas pattern.  Ultrasound of the abdomen April 07, 2006:  Adherent gallstones versus an adenomyomatosis, otherwise unremarkable.  CT on April 08, 2006, shows a small bowel obstruction involving the  right abdomen region, small amount of fluid in the right pelvis, no  other significant abnormalities noted.   Multiple surgical clips in the  midpelvis.   IMPRESSION:  1. Small bowel obstruction.  2. Volume depletion.  3. Prior exploratory laparotomy and hysterectomy 20 years ago.  4. History of cervical cancer.  5. Hypertension.   PLAN:  1. Admit the patient to general surgical floor.  Bowel rest with      n.p.o. status, NG tube, IV fluids, Zofran and morphine for pain and      nausea symptoms.  2. Follow-up two-view abdominal x-ray in the morning and serially.      Will watch the patient clinically and hopefully the bowel      obstruction will resolve without need for surgical intervention.      Allison L. Rennis Harding, N.P.    ______________________________  Anselm Pancoast. Zachery Dakins, M.D.    ALE/MEDQ  D:  04/08/2006  T:  04/08/2006  Job:  [id number]   cc:   Estell Harpin, M.D.

## 2010-11-22 LAB — COMPREHENSIVE METABOLIC PANEL
ALT: 10
Alkaline Phosphatase: 59
BUN: 13
CO2: 29
Chloride: 104
GFR calc non Af Amer: 60 — ABNORMAL LOW
Glucose, Bld: 125 — ABNORMAL HIGH
Potassium: 4.8
Sodium: 140
Total Bilirubin: 0.6
Total Protein: 7.3

## 2010-11-22 LAB — WOUND CULTURE: Gram Stain: NONE SEEN

## 2010-11-22 LAB — DIFFERENTIAL
Basophils Absolute: 0
Basophils Relative: 1
Eosinophils Absolute: 0.3
Neutro Abs: 3.6
Neutrophils Relative %: 47

## 2010-11-22 LAB — CBC
HCT: 36.2
Hemoglobin: 11.9 — ABNORMAL LOW
RBC: 4.54
RDW: 14.9

## 2010-11-22 LAB — GRAM STAIN: Gram Stain: NONE SEEN

## 2012-01-01 ENCOUNTER — Ambulatory Visit (INDEPENDENT_AMBULATORY_CARE_PROVIDER_SITE_OTHER): Payer: Medicare Other | Admitting: Family

## 2012-01-01 ENCOUNTER — Encounter: Payer: Self-pay | Admitting: Family

## 2012-01-01 VITALS — BP 130/68 | HR 100 | Temp 98.2°F | Ht 67.0 in | Wt 178.0 lb

## 2012-01-01 DIAGNOSIS — I10 Essential (primary) hypertension: Secondary | ICD-10-CM

## 2012-01-01 DIAGNOSIS — J00 Acute nasopharyngitis [common cold]: Secondary | ICD-10-CM

## 2012-01-01 DIAGNOSIS — E119 Type 2 diabetes mellitus without complications: Secondary | ICD-10-CM

## 2012-01-01 MED ORDER — METHYLPREDNISOLONE 4 MG PO KIT: PACK | ORAL | Status: AC

## 2012-01-01 NOTE — Progress Notes (Signed)
  Subjective:    Patient ID: Kim [name], female    DOB: [birth year], 71 y.o.   MRN: [id number]  HPI 71 year old non-smoking African American female initial visit seen for complaints of having a cold. Pt has a history of Type II diabetes and hypertension. Pt reports having a recent cold, but post nasal drip persist.  Pt reports checking  BP and blood glucose daily but could not remember results.    Review of Systems  Constitutional: Negative.   HENT: Positive for rhinorrhea.   Eyes: Negative.   Respiratory: Negative.   Cardiovascular: Negative.   Gastrointestinal: Negative.   Genitourinary: Negative.   Musculoskeletal: Negative.   Skin: Negative.   Neurological: Negative.   Hematological: Negative.   Psychiatric/Behavioral: Negative.    Past Medical History  Diagnosis Date  . Diabetes mellitus without complication   . Hypertension     History   Social History  . Marital Status: Married    Spouse Name: N/A    Number of Children: N/A  . Years of Education: N/A   Occupational History  . Not on file.   Social History Main Topics  . Smoking status: Former Games developer  . Smokeless tobacco: Not on file  . Alcohol Use: No  . Drug Use: No  . Sexually Active:    Other Topics Concern  . Not on file   Social History Narrative  . No narrative on file    Past Surgical History  Procedure Date  . Abdominal hysterectomy   . Hernia repair   . Small intestine surgery     No family history on file.  Allergies  Allergen Reactions  . Codeine   . Penicillins     Current Outpatient Prescriptions on File Prior to Visit  Medication Sig Dispense Refill  . losartan-hydrochlorothiazide (HYZAAR) 100-12.5 MG per tablet Take 1 tablet by mouth daily.      . metFORMIN (GLUCOPHAGE) 500 MG tablet Take 500 mg by mouth 2 (two) times daily with a meal.        BP 130/68  Pulse 100  Temp 98.2 F (36.8 C) (Oral)  Ht 5\' 7"  (1.702 m)  Wt 178 lb (80.74 kg)  BMI 27.88 kg/m2  SpO2  97%chart    Objective:   Physical Exam  Constitutional: She is oriented to person, place, and time. She appears well-developed and well-nourished.  HENT:  Head: Normocephalic.  Right Ear: External ear normal.  Left Ear: External ear normal.  Eyes: Pupils are equal, round, and reactive to light.  Neck: Normal range of motion. Neck supple.  Cardiovascular: Normal rate, regular rhythm and normal heart sounds.   Pulmonary/Chest: Effort normal and breath sounds normal.  Abdominal: Soft. Bowel sounds are normal.  Musculoskeletal: Normal range of motion.  Neurological: She is alert and oriented to person, place, and time.  Skin: Skin is warm and dry.  Psychiatric: She has a normal mood and affect. Her behavior is normal. Judgment and thought content normal.          Assessment & Plan:    Assessment: Type II diabetes, Hypertension, Acute Nasopharyngitis   Plan: Refill Metformin 500 mg & Hyzaar 100-12/5, new order for Medrol Dose Pak Obtain records from previous provider. Follow up in 3 months or sooner if needed

## 2012-01-01 NOTE — Patient Instructions (Signed)
Diabetes Meal Planning Guide The diabetes meal planning guide is a tool to help you plan your meals and snacks. It is important for people with diabetes to manage their blood glucose (sugar) levels. Choosing the right foods and the right amounts throughout your day will help control your blood glucose. Eating right can even help you improve your blood pressure and reach or maintain a healthy weight. CARBOHYDRATE COUNTING MADE EASY When you eat carbohydrates, they turn to sugar. This raises your blood glucose level. Counting carbohydrates can help you control this level so you feel better. When you plan your meals by counting carbohydrates, you can have more flexibility in what you eat and balance your medicine with your food intake. Carbohydrate counting simply means adding up the total amount of carbohydrate grams in your meals and snacks. Try to eat about the same amount at each meal. Foods with carbohydrates are listed below. Each portion below is 1 carbohydrate serving or 15 grams of carbohydrates. Ask your dietician how many grams of carbohydrates you should eat at each meal or snack. Grains and Starches  1 slice bread.   English muffin or hotdog/hamburger bun.   cup cold cereal (unsweetened).   cup cooked pasta or rice.   cup starchy vegetables (corn, potatoes, peas, beans, winter squash).  1 tortilla (6 inches).   bagel.  1 waffle or pancake (size of a CD).   cup cooked cereal.  4 to 6 small crackers. *Whole grain is recommended. Fruit  1 cup fresh unsweetened berries, melon, papaya, pineapple.  1 small fresh fruit.   banana or mango.   cup fruit juice (4 oz unsweetened).   cup canned fruit in natural juice or water.  2 tbs dried fruit.  12 to 15 grapes or cherries. Milk and Yogurt  1 cup fat-free or 1% milk.  1 cup soy milk.  6 oz light yogurt with sugar-free sweetener.  6 oz low-fat soy yogurt.  6 oz plain yogurt. Vegetables  1 cup raw or  cup  cooked is counted as 0 carbohydrates or a "free" food.  If you eat 3 or more servings at 1 meal, count them as 1 carbohydrate serving. Other Carbohydrates   oz chips or pretzels.   cup ice cream or frozen yogurt.   cup sherbet or sorbet.  2 inch square cake, no frosting.  1 tbs honey, sugar, jam, jelly, or syrup.  2 small cookies.  3 squares of graham crackers.  3 cups popcorn.  6 crackers.  1 cup broth-based soup.  Count 1 cup casserole or other mixed foods as 2 carbohydrate servings.  Foods with less than 20 calories in a serving may be counted as 0 carbohydrates or a "free" food. You may want to purchase a book or computer software that lists the carbohydrate gram counts of different foods. In addition, the nutrition facts panel on the labels of the foods you eat are a good source of this information. The label will tell you how big the serving size is and the total number of carbohydrate grams you will be eating per serving. Divide this number by 15 to obtain the number of carbohydrate servings in a portion. Remember, 1 carbohydrate serving equals 15 grams of carbohydrate. SERVING SIZES Measuring foods and serving sizes helps you make sure you are getting the right amount of food. The list below tells how big or small some common serving sizes are.  1 oz.........4 stacked dice.  3 oz.........Deck of cards.  1 tsp........Tip   of little finger.  1 tbs......Marland KitchenMarland KitchenThumb.  2 tbs.......Marland KitchenGolf ball.   cup......Marland KitchenHalf of a fist.  1 cup.......Marland KitchenA fist. SAMPLE DIABETES MEAL PLAN Below is a sample meal plan that includes foods from the grain and starches, dairy, vegetable, fruit, and meat groups. A dietician can individualize a meal plan to fit your calorie needs and tell you the number of servings needed from each food group. However, controlling the total amount of carbohydrates in your meal or snack is more important than making sure you include all of the food groups at every  meal. You may interchange carbohydrate containing foods (dairy, starches, and fruits). The meal plan below is an example of a 2000 calorie diet using carbohydrate counting. This meal plan has 17 carbohydrate servings. Breakfast  1 cup oatmeal (2 carb servings).   cup light yogurt (1 carb serving).  1 cup blueberries (1 carb serving).   cup almonds. Snack  1 large apple (2 carb servings).  1 low-fat string cheese stick. Lunch  Chicken breast salad.  1 cup spinach.   cup chopped tomatoes.  2 oz chicken breast, sliced.  2 tbs low-fat [place] dressing.  12 whole-wheat crackers (2 carb servings).  12 to 15 grapes (1 carb serving).  1 cup low-fat milk (1 carb serving). Snack  1 cup carrots.   cup hummus (1 carb serving). Dinner  3 oz broiled salmon.  1 cup brown rice (3 carb servings). Snack  1  cups steamed broccoli (1 carb serving) drizzled with 1 tsp olive oil and lemon juice.  1 cup light pudding (2 carb servings). DIABETES MEAL PLANNING WORKSHEET Your dietician can use this worksheet to help you decide how many servings of foods and what types of foods are right for you.  BREAKFAST Food Group and Servings / Carb Servings Grain/Starches __________________________________ Dairy __________________________________________ Vegetable ______________________________________ Fruit ___________________________________________ Meat __________________________________________ Fat ____________________________________________ LUNCH Food Group and Servings / Carb Servings Grain/Starches ___________________________________ Dairy ___________________________________________ Fruit ____________________________________________ Meat ___________________________________________ Fat _____________________________________________ Laural Golden Food Group and Servings / Carb Servings Grain/Starches ___________________________________ Dairy  ___________________________________________ Fruit ____________________________________________ Meat ___________________________________________ Fat _____________________________________________ SNACKS Food Group and Servings / Carb Servings Grain/Starches ___________________________________ Dairy ___________________________________________ Vegetable _______________________________________ Fruit ____________________________________________ Meat ___________________________________________ Fat _____________________________________________ DAILY TOTALS Starches _________________________ Vegetable ________________________ Fruit ____________________________ Dairy ____________________________ Meat ____________________________ Fat ______________________________ Document Released: 11/08/2004 Document Revised: 05/06/2011 Document Reviewed: 09/19/2008 Exit[employer] Patient Information 2013 [place], [place].   Upper Respiratory Infection, Adult An upper respiratory infection (URI) is also sometimes known as the common cold. The upper respiratory tract includes the nose, sinuses, throat, trachea, and bronchi. Bronchi are the airways leading to the lungs. Most people improve within 1 week, but symptoms can last up to 2 weeks. A residual cough may last even longer.  CAUSES Many different viruses can infect the tissues lining the upper respiratory tract. The tissues become irritated and inflamed and often become very moist. Mucus production is also common. A cold is contagious. You can easily spread the virus to others by oral contact. This includes kissing, sharing a glass, coughing, or sneezing. Touching your mouth or nose and then touching a surface, which is then touched by another person, can also spread the virus. SYMPTOMS  Symptoms typically develop 1 to 3 days after you come in contact with a cold virus. Symptoms vary from person to person. They may include:  Runny nose.  Sneezing.  Nasal  congestion.  Sinus irritation.  Sore throat.  Loss of voice (laryngitis).  Cough.  Fatigue.  Muscle aches.  Loss of  appetite.  Headache.  Low-grade fever. DIAGNOSIS  You might diagnose your own cold based on familiar symptoms, since most people get a cold 2 to 3 times a year. Your caregiver can confirm this based on your exam. Most importantly, your caregiver can check that your symptoms are not due to another disease such as strep throat, sinusitis, pneumonia, asthma, or epiglottitis. Blood tests, throat tests, and X-rays are not necessary to diagnose a common cold, but they may sometimes be helpful in excluding other more serious diseases. Your caregiver will decide if any further tests are required. RISKS AND COMPLICATIONS  You may be at risk for a more severe case of the common cold if you smoke cigarettes, have chronic heart disease (such as heart failure) or lung disease (such as asthma), or if you have a weakened immune system. The very young and very old are also at risk for more serious infections. Bacterial sinusitis, middle ear infections, and bacterial pneumonia can complicate the common cold. The common cold can worsen asthma and chronic obstructive pulmonary disease (COPD). Sometimes, these complications can require emergency medical care and may be life-threatening. PREVENTION  The best way to protect against getting a cold is to practice good hygiene. Avoid oral or hand contact with people with cold symptoms. Wash your hands often if contact occurs. There is no clear evidence that vitamin C, vitamin E, echinacea, or exercise reduces the chance of developing a cold. However, it is always recommended to get plenty of rest and practice good nutrition. TREATMENT  Treatment is directed at relieving symptoms. There is no cure. Antibiotics are not effective, because the infection is caused by a virus, not by bacteria. Treatment may include:  Increased fluid intake. Sports drinks  offer valuable electrolytes, sugars, and fluids.  Breathing heated mist or steam (vaporizer or shower).  Eating chicken soup or other clear broths, and maintaining good nutrition.  Getting plenty of rest.  Using gargles or lozenges for comfort.  Controlling fevers with ibuprofen or acetaminophen as directed by your caregiver.  Increasing usage of your inhaler if you have asthma. Zinc gel and zinc lozenges, taken in the first 24 hours of the common cold, can shorten the duration and lessen the severity of symptoms. Pain medicines may help with fever, muscle aches, and throat pain. A variety of non-prescription medicines are available to treat congestion and runny nose. Your caregiver can make recommendations and may suggest nasal or lung inhalers for other symptoms.  HOME CARE INSTRUCTIONS   Only take over-the-counter or prescription medicines for pain, discomfort, or fever as directed by your caregiver.  Use a warm mist humidifier or inhale steam from a shower to increase air moisture. This may keep secretions moist and make it easier to breathe.  Drink enough water and fluids to keep your urine clear or pale yellow.  Rest as needed.  Return to work when your temperature has returned to normal or as your caregiver advises. You may need to stay home longer to avoid infecting others. You can also use a face mask and careful hand washing to prevent spread of the virus. SEEK MEDICAL CARE IF:   After the first few days, you feel you are getting worse rather than better.  You need your caregiver's advice about medicines to control symptoms.  You develop chills, worsening shortness of breath, or brown or red sputum. These may be signs of pneumonia.  You develop yellow or brown nasal discharge or pain in the face, especially when you  bend forward. These may be signs of sinusitis.  You develop a fever, swollen neck glands, pain with swallowing, or white areas in the back of your throat.  These may be signs of strep throat. SEEK IMMEDIATE MEDICAL CARE IF:   You have a fever.  You develop severe or persistent headache, ear pain, sinus pain, or chest pain.  You develop wheezing, a prolonged cough, cough up blood, or have a change in your usual mucus (if you have chronic lung disease).  You develop sore muscles or a stiff neck. Document Released: 08/07/2000 Document Revised: 05/06/2011 Document Reviewed: 06/15/2010 [facility] Patient Information 2013 [place], [place].

## 2012-04-01 ENCOUNTER — Ambulatory Visit (INDEPENDENT_AMBULATORY_CARE_PROVIDER_SITE_OTHER): Payer: PRIVATE HEALTH INSURANCE | Admitting: Family

## 2012-04-01 VITALS — BP 140/70 | HR 108 | Temp 98.1°F | Wt 180.0 lb

## 2012-04-01 DIAGNOSIS — I10 Essential (primary) hypertension: Secondary | ICD-10-CM

## 2012-04-01 DIAGNOSIS — E7800 Pure hypercholesterolemia, unspecified: Secondary | ICD-10-CM

## 2012-04-01 DIAGNOSIS — E119 Type 2 diabetes mellitus without complications: Secondary | ICD-10-CM

## 2012-04-01 LAB — BASIC METABOLIC PANEL
CO2: 28 mEq/L (ref 19–32)
Calcium: 9.5 mg/dL (ref 8.4–10.5)
Glucose, Bld: 107 mg/dL — ABNORMAL HIGH (ref 70–99)
Potassium: 4.2 mEq/L (ref 3.5–5.1)
Sodium: 139 mEq/L (ref 135–145)

## 2012-04-01 LAB — LIPID PANEL
HDL: 52.8 mg/dL (ref >39.00)
Triglycerides: 227 mg/dL — ABNORMAL HIGH (ref 0.0–149.0)
VLDL: 45.4 mg/dL — ABNORMAL HIGH (ref 0.0–40.0)

## 2012-04-01 LAB — CBC WITH DIFFERENTIAL/PLATELET
Eosinophils Absolute: 0.2 10*3/uL (ref 0.0–0.7)
Eosinophils Relative: 2.1 % (ref 0.0–5.0)
Lymphocytes Relative: 34.2 % (ref 12.0–46.0)
MCHC: 33.2 g/dL (ref 30.0–36.0)
MCV: 79.7 fl (ref 78.0–100.0)
Monocytes Absolute: 0.7 10*3/uL (ref 0.1–1.0)
Neutrophils Relative %: 57.1 % (ref 43.0–77.0)
Platelets: 373 10*3/uL (ref 150.0–400.0)
RBC: 4.51 Mil/uL (ref 3.87–5.11)
WBC: 10.7 10*3/uL — ABNORMAL HIGH (ref 4.5–10.5)

## 2012-04-01 LAB — HEMOGLOBIN A1C: Hgb A1c MFr Bld: 6.4 % (ref 4.6–6.5)

## 2012-04-01 MED ORDER — BAYER MICROLET LANCETS MISC: Status: DC

## 2012-04-01 MED ORDER — GLUCOSE BLOOD VI STRP: ORAL_STRIP | Status: DC

## 2012-04-01 NOTE — Progress Notes (Signed)
  Subjective:    Patient ID: Kim [name], female    DOB: [birth year], 72 y.o.   MRN: [id number]  HPI 72 year old African American female, nonsmoker is in for recheck of type 2 diabetes, hypertension, hyperlipidemia. She's currently doing well all medications. Denies any concerns today. Has not been checking her blood sugars because her insurance company stopped paying for her test strips and lancets. She denies any blurred vision, increase in urination or thirst. She is fasting for this appointment.   Review of Systems  Constitutional: Negative.   HENT: Negative.   Respiratory: Negative.   Cardiovascular: Negative.   Gastrointestinal: Negative.   Musculoskeletal: Negative.   Skin: Negative.   Neurological: Negative.   Hematological: Negative.   Psychiatric/Behavioral: Negative.    Past Medical History  Diagnosis Date  . Diabetes mellitus without complication   . Hypertension     History   Social History  . Marital Status: Married    Spouse Name: N/A    Number of Children: N/A  . Years of Education: N/A   Occupational History  . Not on file.   Social History Main Topics  . Smoking status: Former Games developer  . Smokeless tobacco: Not on file  . Alcohol Use: No  . Drug Use: No  . Sexually Active:    Other Topics Concern  . Not on file   Social History Narrative  . No narrative on file    Past Surgical History  Procedure Date  . Abdominal hysterectomy   . Hernia repair   . Small intestine surgery     No family history on file.  Allergies  Allergen Reactions  . Codeine   . Penicillins     Current Outpatient Prescriptions on File Prior to Visit  Medication Sig Dispense Refill  . losartan-hydrochlorothiazide (HYZAAR) 100-12.5 MG per tablet Take 1 tablet by mouth daily.      . metFORMIN (GLUCOPHAGE) 500 MG tablet Take 500 mg by mouth 2 (two) times daily with a meal.        BP 140/70  Pulse 108  Temp 98.1 F (36.7 C) (Oral)  Wt 180 lb (81.647 kg)  SpO2  98%chart    Objective:   Physical Exam  Constitutional: She is oriented to person, place, and time. She appears well-developed and well-nourished.  HENT:  Right Ear: External ear normal.  Left Ear: External ear normal.  Nose: Nose normal.  Mouth/Throat: Oropharynx is clear and moist.  Neck: Normal range of motion. Neck supple.  Cardiovascular: Normal rate, regular rhythm and normal heart sounds.   Pulmonary/Chest: Effort normal and breath sounds normal.  Abdominal: Soft. Bowel sounds are normal.  Musculoskeletal: Normal range of motion.  Neurological: She is alert and oriented to person, place, and time. She has normal reflexes.  Skin: Skin is warm and dry.  Psychiatric: She has a normal mood and affect.          Assessment & Plan:  Assessment: 1. Type 2 diabetes 2. Hypertension 3. Hypercholesterolemia  Plan: Continue current medications. Lab sent to include BMP, CBC, lipids, A1c will notify patient pending results. Encouraged healthy diet, exercise. We'll follow with patient in the results of her labs, in 3-4 months, and sooner as needed. Glucometer provided today.

## 2012-04-02 ENCOUNTER — Other Ambulatory Visit: Payer: Self-pay

## 2012-04-02 ENCOUNTER — Other Ambulatory Visit: Payer: Self-pay | Admitting: Family

## 2012-04-02 DIAGNOSIS — E119 Type 2 diabetes mellitus without complications: Secondary | ICD-10-CM

## 2012-04-02 LAB — LDL CHOLESTEROL, DIRECT: Direct LDL: 131.8 mg/dL

## 2012-04-02 MED ORDER — GLUCOSE BLOOD VI STRP: ORAL_STRIP | Status: DC

## 2012-04-02 MED ORDER — BAYER MICROLET LANCETS MISC: Status: DC

## 2012-04-02 MED ORDER — SIMVASTATIN 20 MG PO TABS: 20.0000 mg | ORAL_TABLET | Freq: Every evening | ORAL | Status: DC

## 2012-05-13 ENCOUNTER — Other Ambulatory Visit (INDEPENDENT_AMBULATORY_CARE_PROVIDER_SITE_OTHER): Payer: PRIVATE HEALTH INSURANCE

## 2012-05-13 DIAGNOSIS — E785 Hyperlipidemia, unspecified: Secondary | ICD-10-CM

## 2012-05-13 LAB — LIPID PANEL
HDL: 52.2 mg/dL (ref >39.00)
Total CHOL/HDL Ratio: 3

## 2012-05-19 ENCOUNTER — Ambulatory Visit (INDEPENDENT_AMBULATORY_CARE_PROVIDER_SITE_OTHER): Payer: Medicare Other | Admitting: Family

## 2012-05-19 ENCOUNTER — Encounter: Payer: Self-pay | Admitting: Family

## 2012-05-19 VITALS — BP 126/70 | HR 87 | Temp 98.2°F | Wt 173.0 lb

## 2012-05-19 DIAGNOSIS — J029 Acute pharyngitis, unspecified: Secondary | ICD-10-CM

## 2012-05-19 MED ORDER — METHYLPREDNISOLONE ACETATE 40 MG/ML IJ SUSP
80.0000 mg | Freq: Once | INTRAMUSCULAR | Status: AC
  Administered 2012-05-19: 80 mg via INTRAMUSCULAR

## 2012-05-19 NOTE — Patient Instructions (Addendum)
Viral Pharyngitis  Viral pharyngitis is a viral infection that produces redness, pain, and swelling (inflammation) of the throat. It can spread from person to person (contagious).  CAUSES  Viral pharyngitis is caused by inhaling a large amount of certain germs called viruses. Many different viruses cause viral pharyngitis.  SYMPTOMS  Symptoms of viral pharyngitis include:   Sore throat.   Tiredness.   Stuffy nose.   Low-grade fever.   Congestion.   Cough.  TREATMENT  Treatment includes rest, drinking plenty of fluids, and the use of over-the-counter medication (approved by your caregiver).  HOME CARE INSTRUCTIONS    Drink enough fluids to keep your urine clear or pale yellow.   Eat soft, cold foods such as ice cream, frozen ice pops, or gelatin dessert.   Gargle with warm salt water (1 tsp salt per 1 qt of water).   If over age 7, throat lozenges may be used safely.   Only take over-the-counter or prescription medicines for pain, discomfort, or fever as directed by your caregiver. Do not take aspirin.  To help prevent spreading viral pharyngitis to others, avoid:   Mouth-to-mouth contact with others.   Sharing utensils for eating and drinking.   Coughing around others.  SEEK MEDICAL CARE IF:    You are better in a few days, then become worse.   You have a fever or pain not helped by pain medicines.   There are any other changes that concern you.  Document Released: 11/21/2004 Document Revised: 05/06/2011 Document Reviewed: 04/19/2010  [employer] Patient Information 2013 [employer].

## 2012-05-19 NOTE — Progress Notes (Signed)
Subjective:    Patient ID: Kim [name], female    DOB: June 23, 1940, 72 y.o.   MRN: [id number]  HPI 72 year old African American female, nonsmoker is in today with complaints of severe sore throat x2 days and worsening. Patient reports pain with swallowing. She also has left ear pain and tonsillar swelling. Denies any fever, headaches, lightheadedness or dizziness. Denies any exposure to strep   Rev iew of Systems  Constitutional: Negative.   HENT: Positive for ear pain, congestion and sore throat.   Eyes: Negative.   Respiratory: Negative.   Cardiovascular: Negative.   Skin: Negative.   Allergic/Immunologic: Negative.   Neurological: Negative.    Past Medical History  Diagnosis Date  . Diabetes mellitus without complication   . Hypertension     History   Social History  . Marital Status: Married    Spouse Name: N/A    Number of Children: N/A  . Years of Education: N/A   Occupational History  . Not on file.   Social History Main Topics  . Smoking status: Former Games developer  . Smokeless tobacco: Not on file  . Alcohol Use: No  . Drug Use: No  . Sexually Active:    Other Topics Concern  . Not on file   Social History Narrative  . No narrative on file    Past Surgical History  Procedure Laterality Date  . Abdominal hysterectomy    . Hernia repair    . Small intestine surgery      No family history on file.  Allergies  Allergen Reactions  . Codeine   . Penicillins     Current Outpatient Prescriptions on File Prior to Visit  Medication Sig Dispense Refill  . BAYER MICROLET LANCETS lancets Use to check blood sugar once daily fasting, either before breakfast or post prandial  100 each  12  . glucose blood (BAYER CONTOUR TEST) test strip Use to check blood sugar once daily fasting, either before breakfast or post prandial  100 each  12  . losartan-hydrochlorothiazide (HYZAAR) 100-12.5 MG per tablet Take 1 tablet by mouth daily.      . metFORMIN (GLUCOPHAGE)  500 MG tablet Take 500 mg by mouth 2 (two) times daily with a meal.      . simvastatin (ZOCOR) 20 MG tablet Take 1 tablet (20 mg total) by mouth every evening.  30 tablet  3   No current facility-administered medications on file prior to visit.    BP 126/70  Pulse 87  Temp(Src) 98.2 F (36.8 C) (Oral)  Wt 173 lb (78.472 kg)  BMI 27.09 kg/m2  SpO2 92%chart    Objective:   Physical Exam  Constitutional: She is oriented to person, place, and time. She appears well-developed and well-nourished.  HENT:  Right Ear: External ear normal.  Left Ear: External ear normal.  Pharynx moderately red and swollen to the left side.   Neck: Normal range of motion. Neck supple.  Cardiovascular: Normal rate, regular rhythm and normal heart sounds.   Pulmonary/Chest: Effort normal and breath sounds normal.  Neurological: She is alert and oriented to person, place, and time.  Skin: Skin is warm and dry.  Psychiatric: She has a normal mood and affect.     Rapid Strep: negative       Assessment & Plan:  Assessment:   1. Pharyngitis  Plan: Depo-Medrol 80mg  IM x 1. Salt water gargles. Ibuprofen 3 times a day. Patient call the office with any questions or concerns. Recheck a  schedule, and as needed.

## 2012-05-21 ENCOUNTER — Encounter: Payer: Self-pay | Admitting: Internal Medicine

## 2012-05-21 ENCOUNTER — Telehealth: Payer: Self-pay | Admitting: Family

## 2012-05-21 ENCOUNTER — Ambulatory Visit (INDEPENDENT_AMBULATORY_CARE_PROVIDER_SITE_OTHER): Payer: PRIVATE HEALTH INSURANCE | Admitting: Internal Medicine

## 2012-05-21 VITALS — BP 128/70 | HR 98 | Temp 98.1°F | Wt 174.0 lb

## 2012-05-21 DIAGNOSIS — J029 Acute pharyngitis, unspecified: Secondary | ICD-10-CM

## 2012-05-21 MED ORDER — CEFUROXIME AXETIL 500 MG PO TABS: 500.0000 mg | ORAL_TABLET | Freq: Two times a day (BID) | ORAL | Status: AC

## 2012-05-21 NOTE — Assessment & Plan Note (Signed)
72 year old [place] American female with worsening pharyngitis. She was seen 2 days ago.  On exam she has prominent tonsils left greater than right. She has left neck tenderness. Empiric treatment with cefuroxime 500 mg twice daily for 10 days. Her previous reaction to penicillin as rash. Patient advised to call office if symptoms persist or worsen.

## 2012-05-21 NOTE — Telephone Encounter (Signed)
Patient Information:  Caller Name: Corrisa  Phone: [phone]  Patient: Kim [name], [place]  Gender: Female  DOB: 06-18-1940  Age: 72 Years  PCP: Adline Mango [facility])  Office Follow Up:  Does the office need to follow up with this patient?: No  Instructions For The Office: N/A  RN Note:  Seen in office 05/19/12 for sore throat and given symptomatic care advice, as considered viral tonsillitis.  Patient states sore throat is worse, and it is difficult to swallow, although able to take fluids.  Hurts to swallow.  Afebrile.  Has swollen glands in neck.  c/o earache as well.  Per sore throat protocol, advised and offered appt 05/21/12.  Patient states will have to check with daughter, as she does not drive, and will call office for appt today.  krs/can  Symptoms  Reason For Call & Symptoms: sore throat  Reviewed Health History In EMR: Yes  Reviewed Medications In EMR: Yes  Reviewed Allergies In EMR: Yes  Reviewed Surgeries / Procedures: Yes  Date of Onset of Symptoms: 05/17/2012  Guideline(s) Used:  Sore Throat  Disposition Per Guideline:   See Today in Office  Reason For Disposition Reached:   Earache also present  Advice Given:  N/A  Patient Refused Recommendation:  Patient Will Make Own Appointment  Patient needs to discuss with daughter, as she does not drive; will call office to schedule appt krs/can

## 2012-05-21 NOTE — Patient Instructions (Addendum)
Gargle with warm salt water Follow up in 1 week if you experience persistent throat pain

## 2012-05-21 NOTE — Telephone Encounter (Signed)
Pls advise.  

## 2012-05-21 NOTE — Telephone Encounter (Signed)
Noted  

## 2012-05-21 NOTE — Progress Notes (Signed)
  Subjective:    Patient ID: Kim [name], female    DOB: November 15, 1940, 72 y.o.   MRN: [id number]  HPI  72 year old female recently seen for sore throat presents with worsening symptoms.  Patient having difficulty swallowing. Her symptoms worse left side of her neck vs right.  She denies fever or chills.  She also reports hoarseness.  Review of Systems See HPI  Past Medical History  Diagnosis Date  . Diabetes mellitus without complication   . Hypertension     History   Social History  . Marital Status: Married    Spouse Name: N/A    Number of Children: N/A  . Years of Education: N/A   Occupational History  . Not on file.   Social History Main Topics  . Smoking status: Former Games developer  . Smokeless tobacco: Not on file  . Alcohol Use: No  . Drug Use: No  . Sexually Active:    Other Topics Concern  . Not on file   Social History Narrative  . No narrative on file    Past Surgical History  Procedure Laterality Date  . Abdominal hysterectomy    . Hernia repair    . Small intestine surgery      No family history on file.  Allergies  Allergen Reactions  . Codeine   . Penicillins     Current Outpatient Prescriptions on File Prior to Visit  Medication Sig Dispense Refill  . BAYER MICROLET LANCETS lancets Use to check blood sugar once daily fasting, either before breakfast or post prandial  100 each  12  . glucose blood (BAYER CONTOUR TEST) test strip Use to check blood sugar once daily fasting, either before breakfast or post prandial  100 each  12  . losartan-hydrochlorothiazide (HYZAAR) 100-12.5 MG per tablet Take 1 tablet by mouth daily.      . metFORMIN (GLUCOPHAGE) 500 MG tablet Take 500 mg by mouth 2 (two) times daily with a meal.      . simvastatin (ZOCOR) 20 MG tablet Take 1 tablet (20 mg total) by mouth every evening.  30 tablet  3   No current facility-administered medications on file prior to visit.    BP 128/70  Pulse 98  Temp(Src) 98.1 F (36.7  C) (Oral)  Wt 174 lb (78.926 kg)  BMI 27.25 kg/m2       Objective:   Physical Exam  Constitutional: She is oriented to person, place, and time. She appears well-developed and well-nourished.  HENT:  Head: Normocephalic and atraumatic.  Right Ear: External ear normal.  Left Ear: External ear normal.  Mouth/Throat: No oropharyngeal exudate.  Oropharyngeal erythema with prominent tonsils - left greater than right  Cardiovascular: Normal rate, regular rhythm and normal heart sounds.   Pulmonary/Chest: Effort normal and breath sounds normal. She has no wheezes.  Neurological: She is alert and oriented to person, place, and time. No cranial nerve deficit.          Assessment & Plan:

## 2012-07-27 ENCOUNTER — Other Ambulatory Visit: Payer: Self-pay | Admitting: Family

## 2012-07-29 ENCOUNTER — Ambulatory Visit (INDEPENDENT_AMBULATORY_CARE_PROVIDER_SITE_OTHER): Payer: PRIVATE HEALTH INSURANCE | Admitting: Family

## 2012-07-29 ENCOUNTER — Encounter: Payer: Self-pay | Admitting: Family

## 2012-07-29 VITALS — BP 120/60 | HR 97 | Wt 177.0 lb

## 2012-07-29 DIAGNOSIS — E7800 Pure hypercholesterolemia, unspecified: Secondary | ICD-10-CM

## 2012-07-29 DIAGNOSIS — E119 Type 2 diabetes mellitus without complications: Secondary | ICD-10-CM

## 2012-07-29 DIAGNOSIS — I10 Essential (primary) hypertension: Secondary | ICD-10-CM

## 2012-07-29 NOTE — Progress Notes (Signed)
Subjective:    Patient ID: Kim [name], female    DOB: 05-16-1940, 72 y.o.   MRN: [id number]  HPI  Pt is a 72 year old African American female who presents to PCP for follow up of hypertension, hyperlipidemia and Type II Diabetes. Pt denies any acute issues at this time.  Review of Systems  Constitutional: Negative.   HENT: Negative.   Eyes: Negative.   Respiratory: Negative.   Cardiovascular: Negative.   Gastrointestinal: Negative.   Endocrine: Negative.   Genitourinary: Negative.   Musculoskeletal: Negative.   Skin: Negative.   Allergic/Immunologic: Negative.   Neurological: Negative.   Hematological: Negative.   Psychiatric/Behavioral: Negative.    Past Medical History  Diagnosis Date  . Diabetes mellitus without complication   . Hypertension     History   Social History  . Marital Status: Married    Spouse Name: N/A    Number of Children: N/A  . Years of Education: N/A   Occupational History  . Not on file.   Social History Main Topics  . Smoking status: Former Games developer  . Smokeless tobacco: Not on file  . Alcohol Use: No  . Drug Use: No  . Sexually Active:    Other Topics Concern  . Not on file   Social History Narrative  . No narrative on file    Past Surgical History  Procedure Laterality Date  . Abdominal hysterectomy    . Hernia repair    . Small intestine surgery      No family history on file.  Allergies  Allergen Reactions  . Codeine   . Penicillins     Current Outpatient Prescriptions on File Prior to Visit  Medication Sig Dispense Refill  . BAYER MICROLET LANCETS lancets Use to check blood sugar once daily fasting, either before breakfast or post prandial  100 each  12  . glucose blood (BAYER CONTOUR TEST) test strip Use to check blood sugar once daily fasting, either before breakfast or post prandial  100 each  12  . losartan-hydrochlorothiazide (HYZAAR) 100-12.5 MG per tablet Take 1 tablet by mouth daily.      . metFORMIN  (GLUCOPHAGE) 500 MG tablet TAKE 1 TABLET BY MOUTH TWICE A DAY  60 tablet  3  . simvastatin (ZOCOR) 20 MG tablet Take 1 tablet (20 mg total) by mouth every evening.  30 tablet  3   No current facility-administered medications on file prior to visit.    BP 120/60  Pulse 97  Wt 177 lb (80.287 kg)  BMI 27.72 kg/m2  SpO2 97%chart    Objective:   Physical Exam  Constitutional: She is oriented to person, place, and time. She appears well-developed and well-nourished.  HENT:  Head: Normocephalic and atraumatic.  Eyes: Pupils are equal, round, and reactive to light.  Neck: Normal range of motion.  Cardiovascular: Normal rate, regular rhythm and normal heart sounds.   Pulmonary/Chest: Effort normal and breath sounds normal.  Abdominal: Soft. Bowel sounds are normal.  Musculoskeletal: Normal range of motion.  Neurological: She is alert and oriented to person, place, and time.  Skin: Skin is warm and dry.    Microfilament test performed of feet bilaterally. Pt able to identify all points. Diabetic foot exam completed, no cuts, scrapes or wounds present.      Assessment & Plan:  1. Hypertension 2. Hyperlipidemia 3. Type II Diabetes  Plan: Pt instructed to make a follow up appointment in order to obtain fasting lab work. Instructed to  return to PCP if any acute issues arise. Encouraged a healthy diet and exercise.

## 2012-08-05 ENCOUNTER — Other Ambulatory Visit (INDEPENDENT_AMBULATORY_CARE_PROVIDER_SITE_OTHER): Payer: PRIVATE HEALTH INSURANCE

## 2012-08-05 DIAGNOSIS — E119 Type 2 diabetes mellitus without complications: Secondary | ICD-10-CM

## 2012-08-05 DIAGNOSIS — E7800 Pure hypercholesterolemia, unspecified: Secondary | ICD-10-CM

## 2012-08-05 LAB — LIPID PANEL
Cholesterol: 151 mg/dL (ref 0–200)
HDL: 56.6 mg/dL (ref >39.00)
Triglycerides: 111 mg/dL (ref 0.0–149.0)

## 2012-08-05 LAB — BASIC METABOLIC PANEL
BUN: 13 mg/dL (ref 6–23)
CO2: 31 mEq/L (ref 19–32)
Calcium: 9.4 mg/dL (ref 8.4–10.5)
Glucose, Bld: 90 mg/dL (ref 70–99)
Sodium: 139 mEq/L (ref 135–145)

## 2012-08-05 LAB — HEPATIC FUNCTION PANEL
AST: 15 U/L (ref 0–37)
Albumin: 3.7 g/dL (ref 3.5–5.2)
Alkaline Phosphatase: 57 U/L (ref 39–117)
Total Protein: 8.1 g/dL (ref 6.0–8.3)

## 2012-08-05 LAB — HEMOGLOBIN A1C: Hgb A1c MFr Bld: 6.5 % (ref 4.6–6.5)

## 2012-08-06 ENCOUNTER — Other Ambulatory Visit: Payer: PRIVATE HEALTH INSURANCE

## 2012-08-11 ENCOUNTER — Other Ambulatory Visit: Payer: Self-pay | Admitting: Family

## 2012-10-14 ENCOUNTER — Encounter: Payer: Self-pay | Admitting: Family

## 2012-10-14 ENCOUNTER — Telehealth: Payer: Self-pay | Admitting: Family

## 2012-10-14 ENCOUNTER — Ambulatory Visit (INDEPENDENT_AMBULATORY_CARE_PROVIDER_SITE_OTHER): Payer: Medicare Other | Admitting: Family

## 2012-10-14 VITALS — BP 140/70 | HR 110 | Wt 178.0 lb

## 2012-10-14 DIAGNOSIS — R071 Chest pain on breathing: Secondary | ICD-10-CM

## 2012-10-14 DIAGNOSIS — R05 Cough: Secondary | ICD-10-CM

## 2012-10-14 DIAGNOSIS — R0789 Other chest pain: Secondary | ICD-10-CM

## 2012-10-14 DIAGNOSIS — R059 Cough, unspecified: Secondary | ICD-10-CM

## 2012-10-14 MED ORDER — HYDROCOD POLST-CHLORPHEN POLST 10-8 MG/5ML PO LQCR: 5.0000 mL | Freq: Two times a day (BID) | ORAL | Status: DC | PRN

## 2012-10-14 MED ORDER — METHYLPREDNISOLONE 4 MG PO KIT: PACK | ORAL | Status: AC

## 2012-10-14 NOTE — Patient Instructions (Addendum)

## 2012-10-14 NOTE — Progress Notes (Signed)
Subjective:    Patient ID: Kim [name], female    DOB: 01-14-1941, 72 y.o.   MRN: [id number]  HPI 72 year old American female, nonsmoker is in with complaints of left chest wall pain x3 days. He reports reaching up in a candidate to grab a bowl down and pulled her muscle. Where she developed a cough shortly thereafter is nonproductive. She's been taking Mucinex with no relief. Denies any fever or congestion. No shortness of breath.   Review of Systems  Constitutional: Negative.   HENT: Negative.   Respiratory: Positive for cough. Negative for shortness of breath and wheezing.   Cardiovascular: Negative for palpitations and leg swelling.       Chest wall pain  Gastrointestinal: Negative.   Endocrine: Negative.   Genitourinary: Negative.   Musculoskeletal:       Left chest wall pain  Skin: Negative.   Allergic/Immunologic: Negative.   Neurological: Negative.   Psychiatric/Behavioral: Negative.    Past Medical History  Diagnosis Date  . Diabetes mellitus without complication   . Hypertension     History   Social History  . Marital Status: Married    Spouse Name: N/A    Number of Children: N/A  . Years of Education: N/A   Occupational History  . Not on file.   Social History Main Topics  . Smoking status: Former Games developer  . Smokeless tobacco: Not on file  . Alcohol Use: No  . Drug Use: No  . Sexual Activity:    Other Topics Concern  . Not on file   Social History Narrative  . No narrative on file    Past Surgical History  Procedure Laterality Date  . Abdominal hysterectomy    . Hernia repair    . Small intestine surgery      No family history on file.  Allergies  Allergen Reactions  . Codeine   . Penicillins     Current Outpatient Prescriptions on File Prior to Visit  Medication Sig Dispense Refill  . BAYER MICROLET LANCETS lancets Use to check blood sugar once daily fasting, either before breakfast or post prandial  100 each  12  . glucose  blood (BAYER CONTOUR TEST) test strip Use to check blood sugar once daily fasting, either before breakfast or post prandial  100 each  12  . losartan-hydrochlorothiazide (HYZAAR) 100-12.5 MG per tablet Take 1 tablet by mouth daily.      . metFORMIN (GLUCOPHAGE) 500 MG tablet TAKE 1 TABLET BY MOUTH TWICE A DAY  60 tablet  3  . simvastatin (ZOCOR) 20 MG tablet TAKE 1 TABLET BY MOUTH EVERY EVENING  30 tablet  3   No current facility-administered medications on file prior to visit.    BP 140/70  Pulse 110  Wt 178 lb (80.74 kg)  BMI 27.87 kg/m2chart    Objective:   Physical Exam  Constitutional: She is oriented to person, place, and time. She appears well-developed and well-nourished.  HENT:  Right Ear: External ear normal.  Left Ear: External ear normal.  Nose: Nose normal.  Mouth/Throat: Oropharynx is clear and moist.  Neck: Normal range of motion. Neck supple.  Cardiovascular: Normal rate, regular rhythm and normal heart sounds.   Pulmonary/Chest: Effort normal and breath sounds normal. She has no wheezes. She exhibits tenderness.  Left chest wall tenderness. Reproducible to palpation  Neurological: She is alert and oriented to person, place, and time.  Skin: Skin is warm and dry.  Psychiatric: She has a normal mood and  affect.          Assessment & Plan:  Assessment:  1. Left chest wall pain  2. Costochondritis 3. Cough  Plan: Medrol Dosepak as directed. Tussionex 1 teaspoon twice a day as needed for cough. Call the office with any questions or concerns. Recheck, as needed.

## 2012-10-14 NOTE — Telephone Encounter (Signed)
Patient was seen in office 8-20 due to chest wall pain and cough. Was prescribed Tussinex cough medicine but is not covered by her insurance. [facility]/[phone] notified and pharmacist states patient has Medicare part D and will not cover cough medicines. Pharmacists states over the counter cough medicine would be least costly for this patient. Can a over the counter cough medicine be recommended and need to contact patient and advise which one is recommended. Patient also states she has other Brewing technologist and she will contact pharmacy and see if that policy will not pick up cost for this drug as well. Patient phone number: [phone]

## 2012-10-14 NOTE — Telephone Encounter (Signed)
Robitussin DM OTC

## 2012-10-14 NOTE — Telephone Encounter (Signed)
Please advise 

## 2012-10-14 NOTE — Telephone Encounter (Signed)
Pt aware and verbalized understanding.  

## 2012-11-18 ENCOUNTER — Ambulatory Visit: Payer: Medicare Other | Admitting: Family

## 2012-11-18 ENCOUNTER — Telehealth: Payer: Self-pay | Admitting: Family

## 2012-11-18 MED ORDER — METFORMIN HCL 500 MG PO TABS: ORAL_TABLET | ORAL | Status: DC

## 2012-11-18 NOTE — Telephone Encounter (Signed)
Sent!

## 2012-11-18 NOTE — Telephone Encounter (Signed)
Pt requesting a 3 month supply of her metFORMIN (GLUCOPHAGE) 500 MG tablet be sent to [facility] [address]. Please assist.

## 2012-11-20 ENCOUNTER — Other Ambulatory Visit: Payer: Self-pay | Admitting: Family

## 2012-11-25 ENCOUNTER — Encounter: Payer: Self-pay | Admitting: Family

## 2012-12-03 ENCOUNTER — Other Ambulatory Visit: Payer: Self-pay | Admitting: Family

## 2013-02-15 ENCOUNTER — Other Ambulatory Visit: Payer: Self-pay | Admitting: Family

## 2013-03-31 ENCOUNTER — Other Ambulatory Visit: Payer: Self-pay | Admitting: Family

## 2013-05-14 ENCOUNTER — Other Ambulatory Visit: Payer: Self-pay | Admitting: Family

## 2013-06-07 ENCOUNTER — Other Ambulatory Visit: Payer: Self-pay | Admitting: Family

## 2013-09-30 ENCOUNTER — Telehealth: Payer: Self-pay | Admitting: *Deleted

## 2013-09-30 DIAGNOSIS — E119 Type 2 diabetes mellitus without complications: Secondary | ICD-10-CM

## 2013-09-30 NOTE — Telephone Encounter (Signed)
Unable to reach by phone. Letter sent to home address Lipid, a1c, bmet, micro albumin Diabetic bundle

## 2013-10-12 ENCOUNTER — Other Ambulatory Visit: Payer: Self-pay | Admitting: Family Medicine

## 2013-10-12 ENCOUNTER — Ambulatory Visit
Admission: RE | Admit: 2013-10-12 | Discharge: 2013-10-12 | Disposition: A | Discharge status: Home / Self Care | Payer: Medicare Other | Source: Ambulatory Visit | Attending: Family Medicine | Admitting: Family Medicine

## 2013-10-12 DIAGNOSIS — R05 Cough: Secondary | ICD-10-CM

## 2013-10-12 DIAGNOSIS — R059 Cough, unspecified: Secondary | ICD-10-CM

## 2013-10-19 ENCOUNTER — Other Ambulatory Visit (HOSPITAL_COMMUNITY): Payer: Self-pay | Admitting: Respiratory Therapy

## 2013-10-19 DIAGNOSIS — J8410 Pulmonary fibrosis, unspecified: Secondary | ICD-10-CM

## 2013-10-22 ENCOUNTER — Encounter: Payer: Self-pay | Admitting: Internal Medicine

## 2013-10-22 ENCOUNTER — Ambulatory Visit (INDEPENDENT_AMBULATORY_CARE_PROVIDER_SITE_OTHER): Payer: Medicare Other | Admitting: Internal Medicine

## 2013-10-22 VITALS — BP 140/70 | HR 105 | Temp 97.9°F | Ht 69.0 in | Wt 161.2 lb

## 2013-10-22 DIAGNOSIS — I10 Essential (primary) hypertension: Secondary | ICD-10-CM

## 2013-10-22 DIAGNOSIS — R058 Other specified cough: Secondary | ICD-10-CM

## 2013-10-22 DIAGNOSIS — R05 Cough: Secondary | ICD-10-CM

## 2013-10-22 DIAGNOSIS — R059 Cough, unspecified: Secondary | ICD-10-CM

## 2013-10-22 DIAGNOSIS — J8410 Pulmonary fibrosis, unspecified: Secondary | ICD-10-CM

## 2013-10-22 MED ORDER — PANTOPRAZOLE SODIUM 40 MG PO TBEC: 40.0000 mg | DELAYED_RELEASE_TABLET | Freq: Every day | ORAL | Status: DC

## 2013-10-22 MED ORDER — FAMOTIDINE 20 MG PO TABS: ORAL_TABLET | ORAL | Status: DC

## 2013-10-22 NOTE — Progress Notes (Signed)
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    10/22/2013 1st [facility] office visit/ Kamrynn Melott  Chief Complaint  Patient presents with  . Pulmonary Consult    Referred per Dr. Parke Simmers. Pt c/o cough x 2 months- non prod and esp worse at night and when exposed to strong smells.   indolent onset, worse x 2 m day > noct and dry On hyzar since at least 2011 Walks up to 4 blocks when cool s trouble with sob Main problem is smelling cigs/ cleaning solutions.  No previous hx ca, chemo, connective tissue dz, exp to macrodantin or amiodarone to her knowledge  No obvious other patterns in day to day or daytime variabilty or assoc sob  or cp or chest tightness, subjective wheeze overt sinus or hb symptoms. No unusual exp hx or h/o childhood pna or knowledge of premature birth.  Sleeping ok without nocturnal  or early am exacerbation  of respiratory  c/o's or need for noct saba. Also denies any obvious fluctuation of symptoms with weather or environmental changes or other aggravating or alleviating factors except as outlined above   Current Medications, Allergies, Complete Past Medical History, Past Surgical History, Family History, and Social History were reviewed in [facility] record.              Review of Systems  Constitutional: Negative for fever, chills and unexpected weight change.  HENT: Negative for congestion, dental problem, ear pain, nosebleeds, postnasal drip, rhinorrhea, sinus pressure, sneezing, sore throat, trouble  swallowing and voice change.   Eyes: Negative for visual disturbance.  Respiratory: Positive for cough. Negative for choking and shortness of breath.   Cardiovascular: Negative for chest pain and leg swelling.  Gastrointestinal: Negative for vomiting, abdominal pain and diarrhea.  Genitourinary: Negative for difficulty urinating.  Musculoskeletal: Negative for arthralgias.  Skin: Negative for rash.  Neurological: Negative for tremors, syncope and headaches.  Hematological: Does not bruise/bleed easily.       Objective:   Physical Exam  amb bf nad  Wt Readings from Last 3 Encounters:  10/22/13 161 lb 3.2 oz (73.12 kg)  10/14/12 178 lb (80.74 kg)  07/29/12 177 lb (80.287 kg)      HEENT: nl dentition, turbinates, and orophanx. Nl external ear canals without cough reflex   NECK :  without JVD/Nodes/TM/ nl carotid upstrokes bilaterally   LUNGS: no acc muscle use, trace dry insp crackles in bases   without cough on insp or exp maneuvers   CV:  RRR  no s3 or murmur or increase in P2, no edema   ABD:  soft and nontender with nl excursion in the supine position. No bruits or organomegaly, bowel sounds nl  MS:  warm without deformities, calf tenderness, cyanosis or clubbing  SKIN: warm and dry without lesions    NEURO:  alert, approp, no deficits     cxr 08/21/2004  Findings consistent with pulmonary fibrosis.   cxr  10/12/13 Pulmonary fibrosis as noted above. No  new infiltrate or effusion       Assessment & Plan:

## 2013-10-22 NOTE — Assessment & Plan Note (Addendum)
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   In active  smokers Langerhan's Cell  Histiocyctosis (eosinophilic granuomatosis),  DIP,  and Respiratory Bronchiolitis ILD also need to be considered,    Use of PPI is associated with improved survival time and with decreased radiologic fibrosis per King's study published in AJRCCM vol 184 p1390.  Dec 2011  This may not be cause and effect, but given how universally unhelpful all the otherstudy drugs have been for pf,   rec start  rx ppi / diet/ lifestyle modification.     Will see back p pfts

## 2013-10-22 NOTE — Assessment & Plan Note (Signed)
The most common causes of chronic cough in immunocompetent adults include the following: upper airway cough syndrome (UACS), previously referred to as postnasal drip syndrome (PNDS), which is caused by variety of rhinosinus conditions; (2) asthma; (3) GERD; (4) chronic bronchitis from cigarette smoking or other inhaled environmental irritants; (5) nonasthmatic eosinophilic bronchitis; and (6) bronchiectasis.   These conditions, singly or in combination, have accounted for up to 94% of the causes of chronic cough in prospective studies.   Other conditions have constituted no >6% of the causes in prospective studies These have included bronchogenic carcinoma, chronic interstitial pneumonia, sarcoidosis, left ventricular failure, ACEI-induced cough, and aspiration from a condition associated with pharyngeal dysfunction.    Chronic cough is often simultaneously caused by more than one condition. A single cause has been found from 38 to 82% of the time, multiple causes from 18 to 62%. Multiply caused cough has been the result of three diseases up to 42% of the time.       Based on hx and exam, this is most likely:  Classic Upper airway cough syndrome, so named because it's frequently impossible to sort out how much is  CR/sinusitis with freq throat clearing (which can be related to primary GERD)   vs  causing  secondary (" extra esophageal")  GERD from wide swings in gastric pressure that occur with throat clearing, often  promoting self use of mint and menthol lozenges that reduce the lower esophageal sphincter tone and exacerbate the problem further in a cyclical fashion.   These are the same pts (now being labeled as having "irritable larynx syndrome" by some cough centers) who not infrequently have a history of having failed to tolerate ace inhibitors,  dry powder inhalers or biphosphonates or report having atypical reflux symptoms that don't respond to standard doses of PPI , and are easily confused as  having aecopd or asthma flares by even experienced allergists/ pulmonologists.   The first step is to maximize acid suppression and  then regroup in 6 weeks See instructions for specific recommendations which were reviewed directly with the patient who was given a copy with highlighter outlining the key components.

## 2013-10-22 NOTE — Assessment & Plan Note (Signed)
?   Could hyzar be contributing to her cough .   Though rare, for reasons that may related to vascular permability and nitric oxide pathways but not elevated  bradykinin levels (as seen with  ACEi use) losartan in the generic form has been reported now from mulitple sources  to cause a similar pattern of non-specific  upper airway symptoms as seen with acei.   This has not been reported with exposure to the other ARB's to date, so it seems reasonable for now to try either generic diovan or avapro if ARB needed or use an alternative class altogether.  See:  Dewayne Hatch Allergy Asthma Immunol  2008: 101: p [phone]    Will defer to Dr Parke Simmers perhaps trying different class or different ARB and see in meantime if she responds to gerd rx

## 2013-10-22 NOTE — Patient Instructions (Addendum)
Pantoprazole (protonix) 40 mg   Take 30-60 min before first meal of the day and Pepcid 20 mg one bedtime until return to office - this is the best way to tell whether stomach acid is contributing to your problem.    GERD (REFLUX)  is an extremely common cause of respiratory symptoms, many times with no significant heartburn at all.    It can be treated with medication, but also with lifestyle changes including avoidance of late meals, excessive alcohol, smoking cessation, and avoid fatty foods, chocolate, peppermint, colas, red wine, and acidic juices such as orange juice.  NO MINT OR MENTHOL PRODUCTS SO NO COUGH DROPS  USE SUGARLESS CANDY INSTEAD (jolley ranchers or Stover's)  NO OIL BASED VITAMINS - use powdered substitutes.    Please schedule a follow up office visit in 6 weeks, call sooner if needed with pfts

## 2013-10-27 ENCOUNTER — Encounter (HOSPITAL_COMMUNITY): Payer: Medicare Other

## 2013-11-16 ENCOUNTER — Ambulatory Visit (HOSPITAL_COMMUNITY)
Admission: RE | Admit: 2013-11-16 | Discharge: 2013-11-16 | Disposition: A | Discharge status: Home / Self Care | Payer: Medicare Other | Source: Ambulatory Visit | Attending: Family Medicine | Admitting: Family Medicine

## 2013-12-06 ENCOUNTER — Encounter: Payer: Self-pay | Admitting: Internal Medicine

## 2013-12-06 ENCOUNTER — Ambulatory Visit (INDEPENDENT_AMBULATORY_CARE_PROVIDER_SITE_OTHER): Payer: Medicare Other | Admitting: Internal Medicine

## 2013-12-06 VITALS — BP 118/80 | HR 103 | Temp 97.6°F | Ht 69.0 in | Wt 163.0 lb

## 2013-12-06 DIAGNOSIS — J8410 Pulmonary fibrosis, unspecified: Secondary | ICD-10-CM

## 2013-12-06 DIAGNOSIS — R058 Other specified cough: Secondary | ICD-10-CM

## 2013-12-06 DIAGNOSIS — Z23 Encounter for immunization: Secondary | ICD-10-CM

## 2013-12-06 DIAGNOSIS — R05 Cough: Secondary | ICD-10-CM

## 2013-12-06 NOTE — Patient Instructions (Addendum)
Change pepcid to where you take it after supper to see if helps your night time cough  We will refill your medications when they run out but don't stop either acid suppressor as they may be helping your cough   Prevnar and flu shots given today   Please schedule a follow up visit in 3 months but call sooner if needed

## 2013-12-06 NOTE — Progress Notes (Signed)
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   History of Present Illness  10/22/2013 1st [facility] office visit/ Wert  Chief Complaint  Patient presents with  . Pulmonary Consult    Referred per Dr. Parke SimmersBland. Pt c/o cough x 2 months- non prod and esp worse at night and when exposed to strong smells.   indolent onset dry coughing, worse x 2 months with  day > noct  On hyzar since at least 2011 Walks up to 4 blocks when cool s  sob Main problem is smelling cigs/ cleaning solutions.  No previous hx ca, chemo, connective tissue dz, exp to macrodantin or amiodarone to her knowledge rec Pantoprazole (protonix) 40 mg   Take 30-60 min before first meal of the day and Pepcid 20 mg one bedtime until return to office - this is the best way to tell whether stomach acid is contributing to your problem.   GERD diet    12/06/2013 f/u ov/Wert re: PF presumably from remote ali related to chemical exp/ cough  Chief Complaint  Patient presents with  . Follow-up    Pt states that her cough has improved some since the last visit. She states that she mainly only coughs at night or if she gets hot.   coughs some still  But mostly p supper, not while sleeping unless gets hot  Walks up to 30 min, some inclines - no sob Not limited by breathing from desired activities    No obvious day to day or daytime variabilty or assoc excess mucus  or cp or chest tightness, subjective wheeze overt sinus or hb symptoms. No unusual exp hx or h/o childhood pna/ asthma  or knowledge of premature birth.  Sleeping ok without nocturnal  or early am exacerbation  of respiratory  c/o's or need for noct saba. Also denies any obvious fluctuation of symptoms with weather or environmental changes or other aggravating or alleviating factors except as outlined above   Current Medications, Allergies, Complete Past Medical History, Past Surgical History, Family History, and Social History were [facility].  ROS  The following are not active complaints unless bolded sore throat, dysphagia, dental problems, itching, sneezing,  nasal congestion or excess/ purulent secretions, ear ache,   fever, chills, sweats, unintended wt loss, pleuritic or exertional cp, hemoptysis,  orthopnea pnd or leg swelling, presyncope, palpitations, heartburn, abdominal pain, anorexia, nausea, vomiting, diarrhea  or change in bowel or urinary habits, change in stools or urine, dysuria,hematuria,  rash, arthralgias, visual complaints, headache, numbness weakness or ataxia or problems with walking or coordination,  change in mood/affect or memory.                        Objective:   Physical Exam  amb bf nad  12/06/2013      163  Wt Readings from Last 3 Encounters:  10/22/13 161 lb 3.2 oz (73.12 kg)  10/14/12 178 lb (  80.74 kg)  07/29/12 177 lb (80.287 kg)      HEENT: nl dentition, turbinates, and orophanx. Nl external ear canals without cough reflex   NECK :  without JVD/Nodes/TM/ nl carotid upstrokes bilaterally   LUNGS: no acc muscle use, trace dry insp crackles in bases   without cough on insp or exp maneuvers   CV:  RRR  no s3 or murmur or increase in P2, no edema   ABD:  soft and nontender with nl excursion in the supine position. No bruits or organomegaly, bowel sounds nl  MS:  warm without deformities, calf tenderness, cyanosis or clubbing  SKIN: warm and dry without lesions    NEURO:  alert, approp, no deficits     cxr  08/21/2004  Findings consistent with pulmonary fibrosis.   cxr  10/12/13 Pulmonary fibrosis as noted above. No new infiltrate or effusion       Assessment & Plan:

## 2013-12-06 NOTE — Assessment & Plan Note (Signed)
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

## 2013-12-06 NOTE — Assessment & Plan Note (Signed)
Better on gerd rx > should continue for now     Each maintenance medication was reviewed in detail including most importantly the difference between maintenance and as needed and under what circumstances the prns are to be used.  Please see instructions for details which were reviewed in writing and the patient given a copy.

## 2014-02-19 ENCOUNTER — Other Ambulatory Visit: Payer: Self-pay | Admitting: Internal Medicine

## 2014-03-10 ENCOUNTER — Other Ambulatory Visit: Payer: Self-pay | Admitting: Internal Medicine

## 2014-03-21 ENCOUNTER — Ambulatory Visit: Payer: Self-pay | Admitting: Internal Medicine

## 2014-03-23 ENCOUNTER — Ambulatory Visit
Admission: RE | Admit: 2014-03-23 | Discharge: 2014-03-23 | Disposition: A | Discharge status: Home / Self Care | Payer: 59 | Source: Ambulatory Visit | Attending: Family Medicine | Admitting: Family Medicine

## 2014-03-23 ENCOUNTER — Other Ambulatory Visit: Payer: Self-pay | Admitting: Family Medicine

## 2014-03-23 DIAGNOSIS — R0781 Pleurodynia: Secondary | ICD-10-CM

## 2014-03-28 ENCOUNTER — Ambulatory Visit (INDEPENDENT_AMBULATORY_CARE_PROVIDER_SITE_OTHER): Payer: Medicare Other | Admitting: Internal Medicine

## 2014-03-28 ENCOUNTER — Encounter: Payer: Self-pay | Admitting: Internal Medicine

## 2014-03-28 VITALS — BP 134/66 | HR 98 | Ht 69.0 in | Wt 160.0 lb

## 2014-03-28 DIAGNOSIS — J8410 Pulmonary fibrosis, unspecified: Secondary | ICD-10-CM

## 2014-03-28 NOTE — Progress Notes (Signed)
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     History of Present Illness  10/22/2013 1st [facility] office visit/ Kim [name]  Chief Complaint  Patient presents with  . Pulmonary Consult    Referred per Dr. Parke SimmersBland. Pt c/o cough x 2 months- non prod and esp worse at night and when exposed to strong smells.   indolent onset dry coughing, worse x 2 months with  day > noct  On hyzar since at least 2011 Walks up to 4 blocks when cool s  sob Main problem is smelling cigs/ cleaning solutions.  No previous hx ca, chemo, connective tissue dz, exp to macrodantin or amiodarone to her knowledge rec Pantoprazole (protonix) 40 mg   Take 30-60 min before first meal of the day and Pepcid 20 mg one bedtime until return to office - this is the best way to tell whether stomach acid is contributing to your problem.   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 Follow-up    Pt states that her cough has improved some since the last visit. She states that she mainly only coughs at night or if she gets hot.   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 Follow-up    Pt c/o occasional prod cough with clear mucus, no other complaints at this time.   noct cough better on pepcid p supper      No obvious day to day or daytime variabilty or assoc limiting sob(limited by back to very low level exertion)   or cp or chest tightness, subjective wheeze overt sinus or hb symptoms. No unusual exp hx or h/o childhood pna/ asthma or knowledge of premature birth.  Sleeping ok without nocturnal  or early am exacerbation  of respiratory  c/o's or need for noct saba. Also denies any obvious fluctuation of symptoms with weather or environmental changes or other aggravating or alleviating factors except as outlined above   Current Medications, Allergies, Complete Past Medical History, Past Surgical History, Family History, and Social History were reviewed in [facility] electronic medical record.  ROS  The following are not active complaints unless bolded sore throat, dysphagia, dental problems, itching, sneezing,  nasal congestion or excess/ purulent secretions, ear ache,   fever, chills, sweats, unintended wt loss, pleuritic or exertional cp, hemoptysis,  orthopnea pnd or  leg swelling, presyncope, palpitations, heartburn, abdominal pain, anorexia, nausea, vomiting, diarrhea  or change in bowel or urinary habits, change in stools or urine, dysuria,hematuria,  rash, arthralgias, visual complaints, headache, numbness weakness or ataxia or problems with walking or coordination,  change in mood/affect or memory.                        Objective:   Physical Exam  amb bf nad  12/06/2013      163  > 03/28/2014   160  Wt Readings from Last 3 Encounters:  10/22/13 161 lb 3.2 oz (73.12 kg)  10/14/12 178 lb (80.74 kg)  07/29/12 177 lb (80.287 kg)      HEENT: nl dentition, turbinates, and orophanx. Nl  external ear canals without cough reflex   NECK :  without JVD/Nodes/TM/ nl carotid upstrokes bilaterally   LUNGS: no acc muscle use, trace dry insp crackles in bases   without cough on insp or exp maneuvers   CV:  RRR  no s3 or murmur or increase in P2, no edema   ABD:  soft and nontender with nl excursion in the supine position. No bruits or organomegaly, bowel sounds nl  MS:  warm without deformities, calf tenderness, cyanosis or clubbing  SKIN: warm and dry without lesions    NEURO:  alert, approp, no deficits     CXR:  03/23/14  I personally reviewed images and agree with radiology impression as follows:    1. Chronic interstitial lung disease. 2. No acute abnormality. Left ribs are unremarkable. No pneumothorax.     Assessment & Plan:

## 2014-03-28 NOTE — Patient Instructions (Signed)
To get the most out of exercise, you need to be continuously aware that you are short of breath, but never out of breath, for 30 minutes daily. As you improve, it will actually be easier for you to do the same amount of exercise  in  30 minutes so always push to the level where you are short of breath.    Please schedule a follow up visit in 3 months but call sooner if needed  

## 2014-03-30 ENCOUNTER — Encounter: Payer: Self-pay | Admitting: Internal Medicine

## 2014-03-30 NOTE — Assessment & Plan Note (Addendum)
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  - spirometry 11/18/ 2011  1.74 (60%) - PFT's attempted 11/16/13 could not perform See CXR 2006 only  slt worse 10/12/13  - 10/22/2013  Walked RA x 3 laps @ 185 ft each stopped due to  End of study, moderate to fast sat 90% at end > rx just gerd rx - 12/06/2013  Walked RA x 3 laps @ 185 ft each stopped due to  End of study, fast pace, sats 91% at end - 03/28/2014  Walked RA x 2laps @ 185 ft each stopped due to  Back pain, moderate pace, sats 89% no sob   I had an extended discussion with the patient reviewing all relevant studies completed to date and  lasting 15 to 20 minutes of a 25 minute visit on the following ongoing concerns:  1) def has PF but no evidence it is progressing or really limiting him at this point from desired activities 2) Use of PPI is associated with improved survival time and with decreased radiologic fibrosis per King's study published in AJRCCM vol 184 p1390.  Dec 2011 This may not be cause and effect, but given how universally unhelpful all the otherstudy drugs have been for pf,   rec continue rx ppi/h2 / diet/ lifestyle modification.   3) needs to stay as active as his back will allow and let us know if breathing slows him down 4) since can't do pfts will continue to f/u q 3 m with walking sats only     Each maintenance medication was reviewed in detail including most importantly the difference between maintenance and as needed and under what circumstances the prns are to be used.  Please see instructions for details which were reviewed in writing and the patient given a copy.

## 2014-06-21 ENCOUNTER — Other Ambulatory Visit: Payer: Self-pay | Admitting: Physician Assistant

## 2014-06-21 NOTE — H&P (Signed)
TOTAL KNEE ADMISSION H&P  Patient is being admitted for right total knee arthroplasty.  Subjective:  Chief Complaint:right knee pain.  HPI: Kim [name], 74 y.o. female, has a history of pain and functional disability in the right knee due to arthritis and has failed non-surgical conservative treatments for greater than 12 weeks to includeNSAID's and/or analgesics and corticosteriod injections.  Onset of symptoms was abrupt, starting 4 years ago with rapidlly worsening course since that time. The patient noted no past surgery on the right knee(s).  Patient currently rates pain in the right knee(s) at 4 out of 10 with activity. Patient has joint swelling.  Patient has evidence of subchondral sclerosis and joint space narrowing by imaging studies.. There is no active infection.  Patient Active Problem List   Diagnosis Date Noted  . 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 Pure hypercholesterolemia 07/29/2012  . Acute pharyngitis 05/21/2012  . DIABETES, TYPE 2 01/16/2010  . HYPERTENSION 01/16/2010  . Upper airway cough syndrome 01/12/2010   Past Medical History  Diagnosis Date  . Diabetes mellitus without complication   . Hypertension     Past Surgical History  Procedure Laterality Date  . Abdominal hysterectomy    . Hernia repair    . Small intestine surgery       (Not in a hospital admission) Allergies  Allergen Reactions  . Codeine   . Penicillins     History  Substance Use Topics  . Smoking status: Former Smoker -- 0.50 packs/day for 10 years    Types: Cigarettes    Quit date: 02/26/1988  . Smokeless tobacco: Never Used  . Alcohol Use: No    Family History  Problem Relation Age of Onset  . Stomach cancer Sister   . Heart attack Sister      Review of Systems  Constitutional: Negative.   HENT: Negative.   Eyes: Negative.   Respiratory: Positive for cough and wheezing. Negative for hemoptysis, sputum production and shortness of breath.    Cardiovascular: Negative.   Gastrointestinal: Negative.   Genitourinary: Negative.   Musculoskeletal: Positive for joint pain.  Skin: Negative.   Neurological: Negative.   Endo/Heme/Allergies: Negative.   Psychiatric/Behavioral: Negative.     Objective:  Physical Exam  Constitutional: She is oriented to person, place, and time. She appears well-developed and well-nourished.  HENT:  Head: Normocephalic and atraumatic.  Eyes: EOM are normal. Pupils are equal, round, and reactive to light.  Neck: Normal range of motion. Neck supple.  Cardiovascular: Normal rate, regular rhythm and normal heart sounds.  Exam reveals no friction rub.   No murmur heard. Respiratory: Effort normal. No respiratory distress. She has wheezes. She has no rales.  GI: Soft. Bowel sounds are normal. She exhibits no distension. There is no tenderness.  Musculoskeletal:  Examination of her right knee reveals medial joint line tenderness to palpation.  No lateral joint line tenderness.  Mild effusion.  Ligamentous structures are intact with solid end points.  Positive McMurray's and Thessaly, concerning for meniscal pathology.  Normal range of motion between 0-120 degrees.  2+ retropatellar grind.  Neurovascularly intact distally.    Neurological: She is alert and oriented to person, place, and time.  Skin: Skin is warm and dry.  Psychiatric: She has a normal mood and affect. Her behavior is normal. Judgment and thought content normal.    Vital signs in last 24 hours: @VSRANGES @  Labs:   Estimated body mass index is 23.62 kg/(m^2) as calculated from the following:  Height as of 03/28/14: 5\' 9"  (1.753 m).   Weight as of 03/28/14: 72.576 kg (160 lb).   Imaging Review Plain radiographs demonstrate severe degenerative joint disease of the right knee(s). The overall alignment isneutral. The bone quality appears to be fair for age and reported activity level.  Assessment/Plan:  End stage arthritis, right knee    The patient history, physical examination, clinical judgment of the provider and imaging studies are consistent with end stage degenerative joint disease of the right knee(s) and total knee arthroplasty is deemed medically necessary. The treatment options including medical management, injection therapy arthroscopy and arthroplasty were discussed at length. The risks and benefits of total knee arthroplasty were presented and reviewed. The risks due to aseptic loosening, infection, stiffness, patella tracking problems, thromboembolic complications and other imponderables were discussed. The patient acknowledged the explanation, agreed to proceed with the plan and consent was signed. Patient is being admitted for inpatient treatment for surgery, pain control, PT, OT, prophylactic antibiotics, VTE prophylaxis, progressive ambulation and ADL's and discharge planning. The patient is planning to be discharged home with home health services

## 2014-06-22 NOTE — Pre-Procedure Instructions (Signed)
Kim [name]  06/22/2014   Your procedure is scheduled on:  Wednesday Jul 06, 2014 at 8:30 AM.  [facility]:30 AM.  Call this number if you have problems the morning of surgery: [phone]   Remember:   Do not eat food or drink liquids after midnight.   Take these medicines the morning of surgery with A SIP OF WATER: Symbicort, Allegra, Pantoprazole (Protonix), Tramadol (Ultram) if needed for pain.   Do NOT take any diabetic medications the morning of your surgery   Please stop taking any Ibuprofen, Advil, Motrin, Alleve, vitamins, herbal medications, etc on Wednesday May 4th   Do not wear jewelry, make-up or nail polish.  Do not wear lotions, powders, or perfumes. You may wear deodorant.  Do not shave 48 hours prior to surgery.   Do not bring valuables to the hospital.  [facility] is not responsible for any belongings or valuables.               Contacts, dentures or bridgework may not be worn into surgery.  Leave suitcase in the car. After surgery it may be brought to your room.  For patients admitted to the hospital, discharge time is determined by your treatment team.               Patients discharged the day of surgery will not be allowed to drive home.  Name and phone number of your driver:   Special Instructions: Shower using CHG soap the night before and the morning of your surgery   Please read over the following fact sheets that you were given: Pain Booklet, Coughing and Deep Breathing, Blood Transfusion Information, Total Joint Packet, MRSA Information and Surgical Site Infection Prevention

## 2014-06-23 ENCOUNTER — Encounter (HOSPITAL_COMMUNITY)
Admission: RE | Admit: 2014-06-23 | Discharge: 2014-06-23 | Disposition: A | Discharge status: Home / Self Care | Payer: Medicare Other | Source: Ambulatory Visit | Attending: Orthopedic Surgery | Admitting: Orthopedic Surgery

## 2014-06-23 ENCOUNTER — Encounter (HOSPITAL_COMMUNITY): Payer: Self-pay

## 2014-06-23 DIAGNOSIS — I10 Essential (primary) hypertension: Secondary | ICD-10-CM | POA: Diagnosis not present

## 2014-06-23 DIAGNOSIS — Z01810 Encounter for preprocedural cardiovascular examination: Secondary | ICD-10-CM | POA: Insufficient documentation

## 2014-06-23 DIAGNOSIS — Z01812 Encounter for preprocedural laboratory examination: Secondary | ICD-10-CM | POA: Diagnosis not present

## 2014-06-23 DIAGNOSIS — E119 Type 2 diabetes mellitus without complications: Secondary | ICD-10-CM | POA: Insufficient documentation

## 2014-06-23 HISTORY — DX: Gastro-esophageal reflux disease without esophagitis: K21.9

## 2014-06-23 HISTORY — DX: Hyperlipidemia, unspecified: E78.5

## 2014-06-23 HISTORY — DX: Pulmonary fibrosis, unspecified: J84.10

## 2014-06-23 HISTORY — DX: Drug induced constipation: K59.03

## 2014-06-23 HISTORY — DX: Other seasonal allergic rhinitis: J30.2

## 2014-06-23 LAB — COMPREHENSIVE METABOLIC PANEL
ALBUMIN: 3.6 g/dL (ref 3.5–5.2)
ALT: 9 U/L (ref 0–35)
ANION GAP: 9 (ref 5–15)
AST: 16 U/L (ref 0–37)
Alkaline Phosphatase: 57 U/L (ref 39–117)
BUN: 13 mg/dL (ref 6–23)
CO2: 29 mmol/L (ref 19–32)
CREATININE: 0.99 mg/dL (ref 0.50–1.10)
Calcium: 9.5 mg/dL (ref 8.4–10.5)
Chloride: 102 mmol/L (ref 96–112)
GFR calc non Af Amer: 56 mL/min — ABNORMAL LOW (ref >90)
GFR, EST AFRICAN AMERICAN: 64 mL/min — AB (ref >90)
Glucose, Bld: 95 mg/dL (ref 70–99)
Potassium: 3.6 mmol/L (ref 3.5–5.1)
Sodium: 140 mmol/L (ref 135–145)
TOTAL PROTEIN: 7.9 g/dL (ref 6.0–8.3)
Total Bilirubin: 0.4 mg/dL (ref 0.3–1.2)

## 2014-06-23 LAB — CBC WITH DIFFERENTIAL/PLATELET
Basophils Absolute: 0.1 10*3/uL (ref 0.0–0.1)
Basophils Relative: 1 % (ref 0–1)
EOS PCT: 3 % (ref 0–5)
Eosinophils Absolute: 0.3 10*3/uL (ref 0.0–0.7)
HEMATOCRIT: 39 % (ref 36.0–46.0)
HEMOGLOBIN: 12.3 g/dL (ref 12.0–15.0)
LYMPHS ABS: 4 10*3/uL (ref 0.7–4.0)
LYMPHS PCT: 41 % (ref 12–46)
MCH: 26.1 pg (ref 26.0–34.0)
MCHC: 31.5 g/dL (ref 30.0–36.0)
MCV: 82.8 fL (ref 78.0–100.0)
MONO ABS: 0.6 10*3/uL (ref 0.1–1.0)
Monocytes Relative: 6 % (ref 3–12)
NEUTROS ABS: 4.6 10*3/uL (ref 1.7–7.7)
Neutrophils Relative %: 49 % (ref 43–77)
Platelets: 350 10*3/uL (ref 150–400)
RBC: 4.71 MIL/uL (ref 3.87–5.11)
RDW: 14.3 % (ref 11.5–15.5)
WBC: 9.6 10*3/uL (ref 4.0–10.5)

## 2014-06-23 LAB — URINALYSIS, ROUTINE W REFLEX MICROSCOPIC
BILIRUBIN URINE: NEGATIVE
Glucose, UA: NEGATIVE mg/dL
HGB URINE DIPSTICK: NEGATIVE
Ketones, ur: 15 mg/dL — AB
Nitrite: NEGATIVE
PROTEIN: NEGATIVE mg/dL
SPECIFIC GRAVITY, URINE: 1.025 (ref 1.005–1.030)
UROBILINOGEN UA: 0.2 mg/dL (ref 0.0–1.0)
pH: 5 (ref 5.0–8.0)

## 2014-06-23 LAB — SURGICAL PCR SCREEN
MRSA, PCR: NEGATIVE
Staphylococcus aureus: NEGATIVE

## 2014-06-23 LAB — URINE MICROSCOPIC-ADD ON

## 2014-06-23 LAB — ABO/RH: ABO/RH(D): O POS

## 2014-06-23 LAB — TYPE AND SCREEN
ABO/RH(D): O POS
Antibody Screen: NEGATIVE

## 2014-06-23 LAB — PROTIME-INR
INR: 0.93 (ref 0.00–1.49)
Prothrombin Time: 12.6 seconds (ref 11.6–15.2)

## 2014-06-23 LAB — APTT: aPTT: 31 seconds (ref 24–37)

## 2014-06-23 NOTE — Progress Notes (Signed)
Nurse called Dr. Murphy's office and spoke with staff about patient having a penicillin allergy and having Ancef ordered for surgery. Office staff stated Dr. Murphy and PA's were in surgery today but she would have someone return call. Direct number left.  

## 2014-06-23 NOTE — Progress Notes (Signed)
Lindsay, PA returned call and informed Nurse that Ancef which was the antibiotic ordered for surgery was OK for patient to have since the patients reaction was not anaphylaxis. Nurse thanked Lindsay for returning call. Call ended. 

## 2014-06-23 NOTE — Progress Notes (Signed)
PCP is [name]. Patient denied having any acute cardiac or pulmonary issues. Nurse asked patient about fasting glucose levels and patient informed Nurse that she does not check her sugars at home.

## 2014-06-24 NOTE — Progress Notes (Signed)
Anesthesia Chart Review:  Pt is 74 year old female scheduled for R total knee arthroplasty on 07/06/2014 with Dr. Jamison Neighbor. Murphy.   PCP is Dr. [name].   PMH includes: HTN, DM, hyperlipidemia, pulmonary fibrosis, GERD. Former smoker. BMI 23.   Preoperative labs reviewed.    Chest x-ray 10/12/2013 reviewed. Pulmonary fibrosis noted. No new infiltrate or effusion.   EKG: NSR. Possible Anterior infarct, age undetermined. No significant change since 01/12/2010 tracing.   If no changes, I anticipate pt can proceed with surgery as scheduled.   Rica Mastngela Lavina Resor, FNP-BC [facility]Anesthesiology Phone: [phone] 06/24/2014 4:42 PM

## 2014-06-25 ENCOUNTER — Other Ambulatory Visit: Payer: Self-pay | Admitting: Internal Medicine

## 2014-06-27 LAB — URINE CULTURE

## 2014-07-05 MED ORDER — CEFAZOLIN SODIUM-DEXTROSE 2-3 GM-% IV SOLR
2.0000 g | INTRAVENOUS | Status: AC
  Administered 2014-07-06: 2 g via INTRAVENOUS
  Filled 2014-07-05: qty 50

## 2014-07-05 MED ORDER — LACTATED RINGERS IV SOLN
INTRAVENOUS | Status: DC
  Administered 2014-07-06 (×2): via INTRAVENOUS

## 2014-07-06 ENCOUNTER — Encounter (HOSPITAL_COMMUNITY)
Admission: RE | Disposition: A | Discharge status: Home Health | Payer: Self-pay | Source: Ambulatory Visit | Attending: Orthopedic Surgery

## 2014-07-06 ENCOUNTER — Encounter (HOSPITAL_COMMUNITY): Payer: Self-pay | Admitting: *Deleted

## 2014-07-06 ENCOUNTER — Inpatient Hospital Stay (HOSPITAL_COMMUNITY): Payer: Medicare Other | Admitting: Emergency Medicine

## 2014-07-06 ENCOUNTER — Inpatient Hospital Stay (HOSPITAL_COMMUNITY)
Admission: RE | Admit: 2014-07-06 | Discharge: 2014-07-09 | DRG: 470 | Disposition: A | Discharge status: Home Health | Payer: Medicare Other | Source: Ambulatory Visit | Attending: Orthopedic Surgery | Admitting: Orthopedic Surgery

## 2014-07-06 ENCOUNTER — Inpatient Hospital Stay (HOSPITAL_COMMUNITY): Payer: Medicare Other | Admitting: Anesthesiology

## 2014-07-06 ENCOUNTER — Inpatient Hospital Stay (HOSPITAL_COMMUNITY): Payer: Medicare Other

## 2014-07-06 DIAGNOSIS — J8410 Pulmonary fibrosis, unspecified: Secondary | ICD-10-CM | POA: Diagnosis present

## 2014-07-06 DIAGNOSIS — T50905A Adverse effect of unspecified drugs, medicaments and biological substances, initial encounter: Secondary | ICD-10-CM | POA: Diagnosis not present

## 2014-07-06 DIAGNOSIS — M179 Osteoarthritis of knee, unspecified: Secondary | ICD-10-CM | POA: Diagnosis present

## 2014-07-06 DIAGNOSIS — N390 Urinary tract infection, site not specified: Secondary | ICD-10-CM | POA: Diagnosis present

## 2014-07-06 DIAGNOSIS — M1711 Unilateral primary osteoarthritis, right knee: Secondary | ICD-10-CM | POA: Diagnosis present

## 2014-07-06 DIAGNOSIS — Z96659 Presence of unspecified artificial knee joint: Secondary | ICD-10-CM

## 2014-07-06 DIAGNOSIS — Z96651 Presence of right artificial knee joint: Secondary | ICD-10-CM

## 2014-07-06 DIAGNOSIS — Z791 Long term (current) use of non-steroidal anti-inflammatories (NSAID): Secondary | ICD-10-CM

## 2014-07-06 DIAGNOSIS — K219 Gastro-esophageal reflux disease without esophagitis: Secondary | ICD-10-CM | POA: Diagnosis present

## 2014-07-06 DIAGNOSIS — K5900 Constipation, unspecified: Secondary | ICD-10-CM | POA: Diagnosis not present

## 2014-07-06 DIAGNOSIS — R112 Nausea with vomiting, unspecified: Secondary | ICD-10-CM | POA: Diagnosis not present

## 2014-07-06 DIAGNOSIS — M1710 Unilateral primary osteoarthritis, unspecified knee: Secondary | ICD-10-CM | POA: Diagnosis present

## 2014-07-06 DIAGNOSIS — Z87891 Personal history of nicotine dependence: Secondary | ICD-10-CM

## 2014-07-06 DIAGNOSIS — E785 Hyperlipidemia, unspecified: Secondary | ICD-10-CM | POA: Diagnosis present

## 2014-07-06 DIAGNOSIS — D62 Acute posthemorrhagic anemia: Secondary | ICD-10-CM | POA: Diagnosis not present

## 2014-07-06 DIAGNOSIS — E119 Type 2 diabetes mellitus without complications: Secondary | ICD-10-CM | POA: Diagnosis present

## 2014-07-06 DIAGNOSIS — I10 Essential (primary) hypertension: Secondary | ICD-10-CM | POA: Diagnosis present

## 2014-07-06 DIAGNOSIS — M25561 Pain in right knee: Secondary | ICD-10-CM | POA: Diagnosis present

## 2014-07-06 HISTORY — PX: TOTAL KNEE ARTHROPLASTY: SHX125

## 2014-07-06 LAB — GLUCOSE, CAPILLARY
GLUCOSE-CAPILLARY: 140 mg/dL — AB (ref 70–99)
Glucose-Capillary: 107 mg/dL — ABNORMAL HIGH (ref 70–99)
Glucose-Capillary: 88 mg/dL (ref 70–99)
Glucose-Capillary: 179 mg/dL — ABNORMAL HIGH (ref 70–99)

## 2014-07-06 SURGERY — ARTHROPLASTY, KNEE, TOTAL
Anesthesia: Spinal | Site: Knee | Laterality: Right

## 2014-07-06 MED ORDER — SODIUM CHLORIDE 0.9 % IR SOLN
Status: DC | PRN
  Administered 2014-07-06: 1000 mL
  Administered 2014-07-06: 3000 mL

## 2014-07-06 MED ORDER — BISACODYL 5 MG PO TBEC
5.0000 mg | DELAYED_RELEASE_TABLET | Freq: Every day | ORAL | Status: DC | PRN
  Administered 2014-07-07: 5 mg via ORAL
  Filled 2014-07-06: qty 1

## 2014-07-06 MED ORDER — ONDANSETRON HCL 4 MG PO TABS: 4.0000 mg | ORAL_TABLET | Freq: Three times a day (TID) | ORAL | Status: DC | PRN

## 2014-07-06 MED ORDER — METHOCARBAMOL 500 MG PO TABS: 500.0000 mg | ORAL_TABLET | Freq: Four times a day (QID) | ORAL | Status: DC

## 2014-07-06 MED ORDER — BUPIVACAINE-EPINEPHRINE (PF) 0.5% -1:200000 IJ SOLN
INTRAMUSCULAR | Status: AC
  Filled 2014-07-06: qty 30

## 2014-07-06 MED ORDER — DIPHENHYDRAMINE HCL 12.5 MG/5ML PO ELIX: 12.5000 mg | ORAL_SOLUTION | ORAL | Status: DC | PRN

## 2014-07-06 MED ORDER — METHOCARBAMOL 500 MG PO TABS
500.0000 mg | ORAL_TABLET | Freq: Four times a day (QID) | ORAL | Status: DC | PRN
Start: 2014-07-06 — End: 2014-07-09
  Administered 2014-07-06 – 2014-07-08 (×4): 500 mg via ORAL
  Filled 2014-07-06 (×4): qty 1

## 2014-07-06 MED ORDER — METOCLOPRAMIDE HCL 5 MG PO TABS: 5.0000 mg | ORAL_TABLET | Freq: Three times a day (TID) | ORAL | Status: DC | PRN

## 2014-07-06 MED ORDER — METHOCARBAMOL 1000 MG/10ML IJ SOLN
500.0000 mg | INTRAVENOUS | Status: AC
  Administered 2014-07-06: 500 mg via INTRAVENOUS
  Filled 2014-07-06: qty 5

## 2014-07-06 MED ORDER — PROPOFOL 10 MG/ML IV BOLUS
INTRAVENOUS | Status: AC
  Filled 2014-07-06: qty 20

## 2014-07-06 MED ORDER — POLYETHYLENE GLYCOL 3350 17 G PO PACK: 17.0000 g | PACK | Freq: Every day | ORAL | Status: DC | PRN

## 2014-07-06 MED ORDER — LOSARTAN POTASSIUM 50 MG PO TABS
100.0000 mg | ORAL_TABLET | Freq: Every day | ORAL | Status: DC
  Administered 2014-07-07 – 2014-07-08 (×2): 100 mg via ORAL
  Filled 2014-07-06 (×2): qty 2

## 2014-07-06 MED ORDER — BUPIVACAINE IN DEXTROSE 0.75-8.25 % IT SOLN
INTRATHECAL | Status: DC | PRN
  Administered 2014-07-06: 13.5 mg via INTRATHECAL

## 2014-07-06 MED ORDER — PROPOFOL 10 MG/ML IV BOLUS
INTRAVENOUS | Status: DC | PRN
  Administered 2014-07-06 (×2): 20 mg via INTRAVENOUS

## 2014-07-06 MED ORDER — HYDROMORPHONE HCL 1 MG/ML IJ SOLN
0.2500 mg | INTRAMUSCULAR | Status: DC | PRN
  Administered 2014-07-06: 0.5 mg via INTRAVENOUS
  Administered 2014-07-06: 1 mg via INTRAVENOUS

## 2014-07-06 MED ORDER — CEFAZOLIN SODIUM 1-5 GM-% IV SOLN
1.0000 g | Freq: Four times a day (QID) | INTRAVENOUS | Status: AC
  Administered 2014-07-06 (×2): 1 g via INTRAVENOUS
  Filled 2014-07-06: qty 50

## 2014-07-06 MED ORDER — BUDESONIDE-FORMOTEROL FUMARATE 160-4.5 MCG/ACT IN AERO
2.0000 | INHALATION_SPRAY | Freq: Two times a day (BID) | RESPIRATORY_TRACT | Status: DC
  Administered 2014-07-07 – 2014-07-08 (×4): 2 via RESPIRATORY_TRACT
  Filled 2014-07-06 (×2): qty 6

## 2014-07-06 MED ORDER — HYDROMORPHONE HCL 1 MG/ML IJ SOLN
INTRAMUSCULAR | Status: AC
  Administered 2014-07-06: 0.5 mg via INTRAVENOUS
  Filled 2014-07-06: qty 1

## 2014-07-06 MED ORDER — HYDROMORPHONE HCL 1 MG/ML IJ SOLN: 0.2500 mg | INTRAMUSCULAR | Status: DC | PRN

## 2014-07-06 MED ORDER — PANTOPRAZOLE SODIUM 40 MG PO TBEC
40.0000 mg | DELAYED_RELEASE_TABLET | Freq: Every day | ORAL | Status: DC
  Administered 2014-07-06 – 2014-07-08 (×3): 40 mg via ORAL
  Filled 2014-07-06 (×3): qty 1

## 2014-07-06 MED ORDER — OXYCODONE HCL 5 MG PO TABS
ORAL_TABLET | ORAL | Status: AC
  Filled 2014-07-06: qty 2

## 2014-07-06 MED ORDER — PROPOFOL INFUSION 10 MG/ML OPTIME
INTRAVENOUS | Status: DC | PRN
  Administered 2014-07-06: 75 ug/kg/min via INTRAVENOUS

## 2014-07-06 MED ORDER — HYDROMORPHONE HCL 1 MG/ML IJ SOLN
0.5000 mg | INTRAMUSCULAR | Status: DC | PRN
  Administered 2014-07-06 – 2014-07-07 (×2): 1 mg via INTRAVENOUS
  Filled 2014-07-06 (×2): qty 1

## 2014-07-06 MED ORDER — VITAMIN D 50 MCG (2000 UT) PO CAPS: 1.0000 | ORAL_CAPSULE | Freq: Every day | ORAL | Status: DC

## 2014-07-06 MED ORDER — BUPIVACAINE LIPOSOME 1.3 % IJ SUSP
20.0000 mL | INTRAMUSCULAR | Status: AC
  Filled 2014-07-06: qty 20

## 2014-07-06 MED ORDER — APIXABAN 2.5 MG PO TABS: ORAL_TABLET | ORAL | Status: DC

## 2014-07-06 MED ORDER — PHENOL 1.4 % MT LIQD: 1.0000 | OROMUCOSAL | Status: DC | PRN

## 2014-07-06 MED ORDER — ACETAMINOPHEN 325 MG PO TABS
650.0000 mg | ORAL_TABLET | Freq: Four times a day (QID) | ORAL | Status: DC | PRN
  Administered 2014-07-08: 650 mg via ORAL
  Filled 2014-07-06: qty 2

## 2014-07-06 MED ORDER — LOSARTAN POTASSIUM-HCTZ 100-12.5 MG PO TABS: 1.0000 | ORAL_TABLET | Freq: Every day | ORAL | Status: DC

## 2014-07-06 MED ORDER — BISACODYL 5 MG PO TBEC: 5.0000 mg | DELAYED_RELEASE_TABLET | Freq: Every day | ORAL | Status: DC | PRN

## 2014-07-06 MED ORDER — ONDANSETRON HCL 4 MG/2ML IJ SOLN
4.0000 mg | Freq: Four times a day (QID) | INTRAMUSCULAR | Status: DC | PRN
  Administered 2014-07-06 – 2014-07-07 (×2): 4 mg via INTRAVENOUS
  Filled 2014-07-06 (×2): qty 2

## 2014-07-06 MED ORDER — CEFAZOLIN SODIUM 1-5 GM-% IV SOLN
INTRAVENOUS | Status: AC
  Administered 2014-07-06: 1 g via INTRAVENOUS
  Filled 2014-07-06: qty 50

## 2014-07-06 MED ORDER — FLEET ENEMA 7-19 GM/118ML RE ENEM: 1.0000 | ENEMA | Freq: Once | RECTAL | Status: AC | PRN

## 2014-07-06 MED ORDER — METFORMIN HCL 500 MG PO TABS
500.0000 mg | ORAL_TABLET | Freq: Two times a day (BID) | ORAL | Status: DC
  Administered 2014-07-07 – 2014-07-08 (×4): 500 mg via ORAL
  Filled 2014-07-06 (×4): qty 1

## 2014-07-06 MED ORDER — BUPIVACAINE HCL 0.5 % IJ SOLN
INTRAMUSCULAR | Status: DC | PRN
  Administered 2014-07-06: 10 mL

## 2014-07-06 MED ORDER — SIMVASTATIN 40 MG PO TABS
40.0000 mg | ORAL_TABLET | Freq: Every day | ORAL | Status: DC
  Administered 2014-07-06 – 2014-07-08 (×3): 40 mg via ORAL
  Filled 2014-07-06 (×3): qty 1

## 2014-07-06 MED ORDER — ONDANSETRON HCL 4 MG/2ML IJ SOLN
INTRAMUSCULAR | Status: AC
  Filled 2014-07-06: qty 2

## 2014-07-06 MED ORDER — ACETAMINOPHEN 650 MG RE SUPP: 650.0000 mg | Freq: Four times a day (QID) | RECTAL | Status: DC | PRN

## 2014-07-06 MED ORDER — HYDROMORPHONE HCL 1 MG/ML IJ SOLN
INTRAMUSCULAR | Status: AC
Start: 2014-07-06 — End: 2014-07-07
  Filled 2014-07-06: qty 1

## 2014-07-06 MED ORDER — FAMOTIDINE 20 MG PO TABS
20.0000 mg | ORAL_TABLET | Freq: Every day | ORAL | Status: DC
  Administered 2014-07-07 – 2014-07-08 (×2): 20 mg via ORAL
  Filled 2014-07-06 (×2): qty 1

## 2014-07-06 MED ORDER — MENTHOL 3 MG MT LOZG: 1.0000 | LOZENGE | OROMUCOSAL | Status: DC | PRN

## 2014-07-06 MED ORDER — CELECOXIB 200 MG PO CAPS
ORAL_CAPSULE | ORAL | Status: AC
Start: 2014-07-06 — End: 2014-07-07
  Filled 2014-07-06: qty 1

## 2014-07-06 MED ORDER — POTASSIUM CHLORIDE IN NACL 20-0.9 MEQ/L-% IV SOLN
INTRAVENOUS | Status: DC
  Administered 2014-07-06 – 2014-07-07 (×2): via INTRAVENOUS
  Filled 2014-07-06 (×4): qty 1000

## 2014-07-06 MED ORDER — METOCLOPRAMIDE HCL 5 MG/ML IJ SOLN
5.0000 mg | Freq: Three times a day (TID) | INTRAMUSCULAR | Status: DC | PRN
  Administered 2014-07-06: 10 mg via INTRAVENOUS
  Filled 2014-07-06: qty 2

## 2014-07-06 MED ORDER — METHOCARBAMOL 1000 MG/10ML IJ SOLN
500.0000 mg | Freq: Four times a day (QID) | INTRAVENOUS | Status: DC | PRN
  Filled 2014-07-06: qty 5

## 2014-07-06 MED ORDER — DEXAMETHASONE SODIUM PHOSPHATE 10 MG/ML IJ SOLN
INTRAMUSCULAR | Status: DC | PRN
  Administered 2014-07-06: 4 mg via INTRAVENOUS

## 2014-07-06 MED ORDER — ZOLPIDEM TARTRATE 5 MG PO TABS: 5.0000 mg | ORAL_TABLET | Freq: Every evening | ORAL | Status: DC | PRN

## 2014-07-06 MED ORDER — VITAMIN D 1000 UNITS PO TABS
2000.0000 [IU] | ORAL_TABLET | Freq: Every day | ORAL | Status: DC
  Administered 2014-07-07 – 2014-07-08 (×2): 2000 [IU] via ORAL
  Filled 2014-07-06 (×2): qty 2

## 2014-07-06 MED ORDER — ONDANSETRON HCL 4 MG/2ML IJ SOLN
INTRAMUSCULAR | Status: DC | PRN
  Administered 2014-07-06: 4 mg via INTRAVENOUS

## 2014-07-06 MED ORDER — BUPIVACAINE LIPOSOME 1.3 % IJ SUSP
INTRAMUSCULAR | Status: DC | PRN
  Administered 2014-07-06: 20 mL

## 2014-07-06 MED ORDER — OXYCODONE-ACETAMINOPHEN 5-325 MG PO TABS: 1.0000 | ORAL_TABLET | ORAL | Status: DC | PRN

## 2014-07-06 MED ORDER — APIXABAN 2.5 MG PO TABS
2.5000 mg | ORAL_TABLET | Freq: Two times a day (BID) | ORAL | Status: DC
  Administered 2014-07-07 – 2014-07-08 (×4): 2.5 mg via ORAL
  Filled 2014-07-06 (×4): qty 1

## 2014-07-06 MED ORDER — CELECOXIB 200 MG PO CAPS
200.0000 mg | ORAL_CAPSULE | Freq: Two times a day (BID) | ORAL | Status: DC
  Administered 2014-07-06 – 2014-07-08 (×6): 200 mg via ORAL
  Filled 2014-07-06 (×5): qty 1

## 2014-07-06 MED ORDER — DEXTROMETHORPHAN-GUAIFENESIN 10-100 MG/5ML PO LIQD
2.5000 mL | ORAL | Status: DC | PRN
  Filled 2014-07-06: qty 5

## 2014-07-06 MED ORDER — OXYCODONE HCL 5 MG PO TABS
5.0000 mg | ORAL_TABLET | ORAL | Status: DC | PRN
  Administered 2014-07-06 – 2014-07-09 (×10): 10 mg via ORAL
  Filled 2014-07-06 (×9): qty 2

## 2014-07-06 MED ORDER — DEXAMETHASONE SODIUM PHOSPHATE 4 MG/ML IJ SOLN
INTRAMUSCULAR | Status: AC
  Filled 2014-07-06: qty 1

## 2014-07-06 MED ORDER — HYDROCHLOROTHIAZIDE 12.5 MG PO CAPS
12.5000 mg | ORAL_CAPSULE | Freq: Every day | ORAL | Status: DC
  Administered 2014-07-07 – 2014-07-08 (×2): 12.5 mg via ORAL
  Filled 2014-07-06 (×2): qty 1

## 2014-07-06 MED ORDER — METHOCARBAMOL 1000 MG/10ML IJ SOLN: 500.0000 mg | INTRAVENOUS | Status: DC

## 2014-07-06 MED ORDER — ONDANSETRON HCL 4 MG PO TABS
4.0000 mg | ORAL_TABLET | Freq: Four times a day (QID) | ORAL | Status: DC | PRN
  Administered 2014-07-08: 4 mg via ORAL
  Filled 2014-07-06: qty 1

## 2014-07-06 MED ORDER — INSULIN ASPART 100 UNIT/ML ~~LOC~~ SOLN
0.0000 [IU] | Freq: Three times a day (TID) | SUBCUTANEOUS | Status: DC
  Administered 2014-07-06 – 2014-07-07 (×3): 1 [IU] via SUBCUTANEOUS

## 2014-07-06 SURGICAL SUPPLY — 69 items
BANDAGE ELASTIC 4 VELCRO ST LF (GAUZE/BANDAGES/DRESSINGS) ×3 IMPLANT
BANDAGE ELASTIC 6 VELCRO ST LF (GAUZE/BANDAGES/DRESSINGS) ×3 IMPLANT
BANDAGE ESMARK 6X9 LF (GAUZE/BANDAGES/DRESSINGS) ×1 IMPLANT
BENZOIN TINCTURE PRP APPL 2/3 (GAUZE/BANDAGES/DRESSINGS) ×3 IMPLANT
BLADE SAG 18X100X1.27 (BLADE) ×6 IMPLANT
BNDG ESMARK 6X9 LF (GAUZE/BANDAGES/DRESSINGS) ×3
BOWL SMART MIX CTS (DISPOSABLE) ×3 IMPLANT
CAPT KNEE TOTAL 3 ×3 IMPLANT
CEMENT BONE SIMPLEX SPEEDSET (Cement) ×6 IMPLANT
CLOSURE WOUND 1/2 X4 (GAUZE/BANDAGES/DRESSINGS) ×1
COVER SURGICAL LIGHT HANDLE (MISCELLANEOUS) ×3 IMPLANT
CUFF TOURNIQUET SINGLE 34IN LL (TOURNIQUET CUFF) ×3 IMPLANT
DRAPE EXTREMITY T 121X128X90 (DRAPE) ×3 IMPLANT
DRAPE IMP U-DRAPE 54X76 (DRAPES) ×3 IMPLANT
DRAPE PROXIMA HALF (DRAPES) ×3 IMPLANT
DRAPE U-SHAPE 47X51 STRL (DRAPES) ×3 IMPLANT
DRSG PAD ABDOMINAL 8X10 ST (GAUZE/BANDAGES/DRESSINGS) ×3 IMPLANT
DURAPREP 26ML APPLICATOR (WOUND CARE) ×6 IMPLANT
ELECT CAUTERY BLADE 6.4 (BLADE) ×3 IMPLANT
ELECT REM PT RETURN 9FT ADLT (ELECTROSURGICAL) ×3
ELECTRODE REM PT RTRN 9FT ADLT (ELECTROSURGICAL) ×1 IMPLANT
EVACUATOR 1/8 PVC DRAIN (DRAIN) ×3 IMPLANT
FACESHIELD WRAPAROUND (MASK) ×3 IMPLANT
GAUZE SPONGE 4X4 12PLY STRL (GAUZE/BANDAGES/DRESSINGS) ×3 IMPLANT
GLOVE BIOGEL PI IND STRL 6.5 (GLOVE) ×1 IMPLANT
GLOVE BIOGEL PI IND STRL 7.0 (GLOVE) ×1 IMPLANT
GLOVE BIOGEL PI IND STRL 7.5 (GLOVE) ×1 IMPLANT
GLOVE BIOGEL PI INDICATOR 6.5 (GLOVE) ×2
GLOVE BIOGEL PI INDICATOR 7.0 (GLOVE) ×2
GLOVE BIOGEL PI INDICATOR 7.5 (GLOVE) ×2
GLOVE ECLIPSE 7.0 STRL STRAW (GLOVE) ×3 IMPLANT
GLOVE ORTHO TXT STRL SZ7.5 (GLOVE) ×3 IMPLANT
GLOVE SURG SS PI 6.0 STRL IVOR (GLOVE) ×3 IMPLANT
GLOVE SURG SS PI 7.0 STRL IVOR (GLOVE) ×3 IMPLANT
GOWN STRL REUS W/ TWL LRG LVL3 (GOWN DISPOSABLE) ×3 IMPLANT
GOWN STRL REUS W/ TWL XL LVL3 (GOWN DISPOSABLE) ×1 IMPLANT
GOWN STRL REUS W/TWL LRG LVL3 (GOWN DISPOSABLE) ×6
GOWN STRL REUS W/TWL XL LVL3 (GOWN DISPOSABLE) ×2
HANDPIECE INTERPULSE COAX TIP (DISPOSABLE) ×2
IMMOBILIZER KNEE 22 UNIV (SOFTGOODS) ×3 IMPLANT
IMMOBILIZER KNEE 24 THIGH 36 (MISCELLANEOUS) IMPLANT
IMMOBILIZER KNEE 24 UNIV (MISCELLANEOUS)
KIT BASIN OR (CUSTOM PROCEDURE TRAY) ×3 IMPLANT
KIT ROOM TURNOVER OR (KITS) ×3 IMPLANT
MANIFOLD NEPTUNE II (INSTRUMENTS) ×3 IMPLANT
NEEDLE 18GX1X1/2 (RX/OR ONLY) (NEEDLE) ×3 IMPLANT
NEEDLE HYPO 25GX1X1/2 BEV (NEEDLE) ×3 IMPLANT
NS IRRIG 1000ML POUR BTL (IV SOLUTION) ×3 IMPLANT
PACK TOTAL JOINT (CUSTOM PROCEDURE TRAY) ×3 IMPLANT
PACK UNIVERSAL I (CUSTOM PROCEDURE TRAY) ×3 IMPLANT
PAD ARMBOARD 7.5X6 YLW CONV (MISCELLANEOUS) ×3 IMPLANT
PAD CAST 4YDX4 CTTN HI CHSV (CAST SUPPLIES) ×1 IMPLANT
PADDING CAST COTTON 4X4 STRL (CAST SUPPLIES) ×2
PADDING CAST COTTON 6X4 STRL (CAST SUPPLIES) ×3 IMPLANT
SET HNDPC FAN SPRY TIP SCT (DISPOSABLE) ×1 IMPLANT
STRIP CLOSURE SKIN 1/2X4 (GAUZE/BANDAGES/DRESSINGS) ×2 IMPLANT
SUCTION FRAZIER TIP 10 FR DISP (SUCTIONS) ×3 IMPLANT
SUT MNCRL AB 4-0 PS2 18 (SUTURE) ×3 IMPLANT
SUT VIC AB 0 CT1 27 (SUTURE) ×4
SUT VIC AB 0 CT1 27XBRD ANBCTR (SUTURE) ×2 IMPLANT
SUT VIC AB 1 CT1 27 (SUTURE) ×2
SUT VIC AB 1 CT1 27XBRD ANBCTR (SUTURE) ×1 IMPLANT
SUT VIC AB 2-0 CT1 27 (SUTURE) ×4
SUT VIC AB 2-0 CT1 TAPERPNT 27 (SUTURE) ×2 IMPLANT
SYR 50ML LL SCALE MARK (SYRINGE) ×3 IMPLANT
SYR CONTROL 10ML LL (SYRINGE) ×3 IMPLANT
TOWEL OR 17X24 6PK STRL BLUE (TOWEL DISPOSABLE) ×3 IMPLANT
TOWEL OR 17X26 10 PK STRL BLUE (TOWEL DISPOSABLE) ×3 IMPLANT
TRAY CATH 16FR W/PLASTIC CATH (SET/KITS/TRAYS/PACK) ×3 IMPLANT

## 2014-07-06 NOTE — Anesthesia Preprocedure Evaluation (Addendum)
Anesthesia Evaluation  Patient identified by MRN, date of birth, ID band Patient awake    Reviewed: Allergy & Precautions, H&P , NPO status , Patient's Chart, lab work & pertinent test results  Airway Mallampati: I  TM Distance: >3 FB Neck ROM: Full    Dental no notable dental hx. (+) Teeth Intact, Dental Advisory Given   Pulmonary neg pulmonary ROS, former smoker,  breath sounds clear to auscultation  Pulmonary exam normal       Cardiovascular hypertension, Pt. on medications Rhythm:Regular Rate:Normal     Neuro/Psych negative neurological ROS  negative psych ROS   GI/Hepatic Neg liver ROS, GERD-  Medicated,  Endo/Other  diabetes, Type 2, Oral Hypoglycemic Agents  Renal/GU negative Renal ROS  negative genitourinary   Musculoskeletal   Abdominal   Peds  Hematology negative hematology ROS (+)   Anesthesia Other Findings   Reproductive/Obstetrics negative OB ROS                            Anesthesia Physical Anesthesia Plan  ASA: II  Anesthesia Plan: Spinal   Post-op Pain Management:    Induction: Intravenous  Airway Management Planned: Simple Face Mask  Additional Equipment:   Intra-op Plan:   Post-operative Plan:   Informed Consent: I have reviewed the patients History and Physical, chart, labs and discussed the procedure including the risks, benefits and alternatives for the proposed anesthesia with the patient or authorized representative who has indicated his/her understanding and acceptance.   Dental advisory given  Plan Discussed with: CRNA  Anesthesia Plan Comments:         Anesthesia Quick Evaluation

## 2014-07-06 NOTE — H&P (View-Only) (Signed)
TOTAL KNEE ADMISSION H&P  Patient is being admitted for right total knee arthroplasty.  Subjective:  Chief Complaint:right knee pain.  HPI: Kim [name], 73 y.o. female, has a history of pain and functional disability in the right knee due to arthritis and has failed non-surgical conservative treatments for greater than 12 weeks to includeNSAID's and/or analgesics and corticosteriod injections.  Onset of symptoms was abrupt, starting 4 years ago with rapidlly worsening course since that time. The patient noted no past surgery on the right knee(s).  Patient currently rates pain in the right knee(s) at 4 out of 10 with activity. Patient has joint swelling.  Patient has evidence of subchondral sclerosis and joint space narrowing by imaging studies.. There is no active infection.  Patient Active Problem List   Diagnosis Date Noted  . 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 Pure hypercholesterolemia 07/29/2012  . Acute pharyngitis 05/21/2012  . DIABETES, TYPE 2 01/16/2010  . HYPERTENSION 01/16/2010  . Upper airway cough syndrome 01/12/2010   Past Medical History  Diagnosis Date  . Diabetes mellitus without complication   . Hypertension     Past Surgical History  Procedure Laterality Date  . Abdominal hysterectomy    . Hernia repair    . Small intestine surgery       (Not in a hospital admission) Allergies  Allergen Reactions  . Codeine   . Penicillins     History  Substance Use Topics  . Smoking status: Former Smoker -- 0.50 packs/day for 10 years    Types: Cigarettes    Quit date: 02/26/1988  . Smokeless tobacco: Never Used  . Alcohol Use: No    Family History  Problem Relation Age of Onset  . Stomach cancer Sister   . Heart attack Sister      Review of Systems  Constitutional: Negative.   HENT: Negative.   Eyes: Negative.   Respiratory: Positive for cough and wheezing. Negative for hemoptysis, sputum production and shortness of breath.    Cardiovascular: Negative.   Gastrointestinal: Negative.   Genitourinary: Negative.   Musculoskeletal: Positive for joint pain.  Skin: Negative.   Neurological: Negative.   Endo/Heme/Allergies: Negative.   Psychiatric/Behavioral: Negative.     Objective:  Physical Exam  Constitutional: She is oriented to person, place, and time. She appears well-developed and well-nourished.  HENT:  Head: Normocephalic and atraumatic.  Eyes: EOM are normal. Pupils are equal, round, and reactive to light.  Neck: Normal range of motion. Neck supple.  Cardiovascular: Normal rate, regular rhythm and normal heart sounds.  Exam reveals no friction rub.   No murmur heard. Respiratory: Effort normal. No respiratory distress. She has wheezes. She has no rales.  GI: Soft. Bowel sounds are normal. She exhibits no distension. There is no tenderness.  Musculoskeletal:  Examination of her right knee reveals medial joint line tenderness to palpation.  No lateral joint line tenderness.  Mild effusion.  Ligamentous structures are intact with solid end points.  Positive McMurray's and Thessaly, concerning for meniscal pathology.  Normal range of motion between 0-120 degrees.  2+ retropatellar grind.  Neurovascularly intact distally.    Neurological: She is alert and oriented to person, place, and time.  Skin: Skin is warm and dry.  Psychiatric: She has a normal mood and affect. Her behavior is normal. Judgment and thought content normal.    Vital signs in last 24 hours: @VSRANGES@  Labs:   Estimated body mass index is 23.62 kg/(m^2) as calculated from the following:     Height as of 03/28/14: 5\' 9"  (1.753 m).   Weight as of 03/28/14: 72.576 kg (160 lb).   Imaging Review Plain radiographs demonstrate severe degenerative joint disease of the right knee(s). The overall alignment isneutral. The bone quality appears to be fair for age and reported activity level.  Assessment/Plan:  End stage arthritis, right knee    The patient history, physical examination, clinical judgment of the provider and imaging studies are consistent with end stage degenerative joint disease of the right knee(s) and total knee arthroplasty is deemed medically necessary. The treatment options including medical management, injection therapy arthroscopy and arthroplasty were discussed at length. The risks and benefits of total knee arthroplasty were presented and reviewed. The risks due to aseptic loosening, infection, stiffness, patella tracking problems, thromboembolic complications and other imponderables were discussed. The patient acknowledged the explanation, agreed to proceed with the plan and consent was signed. Patient is being admitted for inpatient treatment for surgery, pain control, PT, OT, prophylactic antibiotics, VTE prophylaxis, progressive ambulation and ADL's and discharge planning. The patient is planning to be discharged home with home health services

## 2014-07-06 NOTE — Progress Notes (Signed)
Orthopedic Tech Progress Note Patient Details:  Kim [name] 09-03-1940 [id number]  CPM Right Knee CPM Right Knee: On Right Knee Flexion (Degrees): 90 Right Knee Extension (Degrees): 0 Additional Comments: trapeze bar patient helper Viewed order from doctor's order list  Kim [name], Kim [name] 07/06/2014, 11:12 AM

## 2014-07-06 NOTE — Interval H&P Note (Signed)
History and Physical Interval Note:  07/06/2014 8:33 AM  Kim [name]  has presented today for surgery, with the diagnosis of djd right knee  The various methods of treatment have been discussed with the patient and family. After consideration of risks, benefits and other options for treatment, the patient has consented to  Procedure(s): RIGHT TOTAL KNEE ARTHROPLASTY (Right) as a surgical intervention .  The patient's history has been reviewed, patient examined, no change in status, stable for surgery.  I have reviewed the patient's chart and labs.  Questions were answered to the patient's satisfaction.     Chayson Charters F

## 2014-07-06 NOTE — Evaluation (Signed)
Physical Therapy Evaluation Patient Details Name: Kim [name] MRN: [id number] DOB: May 04, 1940 Today's Date: 07/06/2014   History of Present Illness  Patient is a 74 y/o female s/p R TKA. PMH of HTN, HLD, DM  Clinical Impression  Patient presents with pain and post surgical deficits RLE s/p R TKA. Education provided on HEP and knee precautions. Tolerated short distance ambulation with Min guard assist for safety. Pt's daughter will be staying with her at d/c. Would benefit from skilled PT to improve transfers, gait, balance and mobility so pt can maximize independence and return to PLOF.     Follow Up Recommendations Home health PT;Supervision/Assistance - 24 hour    Equipment Recommendations  None recommended by PT    Recommendations for Other Services       Precautions / Restrictions Precautions Precautions: Fall;Knee Precaution Booklet Issued: Yes (comment) Precaution Comments: Reviewed precautions and HEP. Restrictions Weight Bearing Restrictions: Yes RLE Weight Bearing: Weight bearing as tolerated      Mobility  Bed Mobility Overal bed mobility: Needs Assistance Bed Mobility: Supine to Sit;Sit to Supine     Supine to sit: Min assist;HOB elevated Sit to supine: Min guard   General bed mobility comments: Min A to bring RLE to EOB and back into bed. Use of rails for support.  Transfers Overall transfer level: Needs assistance Equipment used: Rolling walker (2 wheeled) Transfers: Sit to/from Stand Sit to Stand: Min assist         General transfer comment: Min A to rise from EOB with cues for hand placement despite pt wanting to pull up on RW.  Ambulation/Gait Ambulation/Gait assistance: Min guard Ambulation Distance (Feet): 15 Feet Assistive device: Rolling walker (2 wheeled) Gait Pattern/deviations: Step-to pattern;Decreased stance time - right;Decreased step length - left;Trunk flexed   Gait velocity interpretation: Below normal speed for  age/gender General Gait Details: Cues for RW management. Difficulty navigating RW during turns. SLow, guarded gait. Dyspnea present. Vitals stable.  Stairs            Wheelchair Mobility    Modified Rankin (Stroke Patients Only)       Balance Overall balance assessment: Needs assistance Sitting-balance support: Feet supported;No upper extremity supported Sitting balance-Leahy Scale: Good     Standing balance support: During functional activity Standing balance-Leahy Scale: Poor Standing balance comment: Relient on RW.                             Pertinent Vitals/Pain Pain Assessment: Faces Faces Pain Scale: Hurts little more Pain Location: right knee Pain Descriptors / Indicators: Sore;Aching Pain Intervention(s): Monitored during session;Repositioned;Premedicated before session    Home Living Family/patient expects to be discharged to:: Private residence Living Arrangements: Children;Spouse/significant other Available Help at Discharge: Family;Available 24 hours/day Type of Home: House Home Access: Stairs to enter Entrance Stairs-Rails: Doctor, general practiceight;Left Entrance Stairs-Number of Steps: 4-6 Home Layout: One level Home Equipment: Walker - 2 wheels;Bedside commode;Cane - single point      Prior Function Level of Independence: Independent with assistive device(s)         Comments: Pt using SPC for community ambulation. Independent for household ambulation.     Hand Dominance        Extremity/Trunk Assessment   Upper Extremity Assessment: Defer to OT evaluation           Lower Extremity Assessment: RLE deficits/detail RLE Deficits / Details: Limited AROM hip/knee, ankle AROM WFL.       Communication  Communication: No difficulties  Cognition Arousal/Alertness: Awake/alert Behavior During Therapy: WFL for tasks assessed/performed Overall Cognitive Status: Within Functional Limits for tasks assessed                       General Comments General comments (skin integrity, edema, etc.): Daughter in room during PT evaluation.    Exercises Total Joint Exercises Ankle Circles/Pumps: Both;15 reps;Supine Quad Sets: Right;5 reps;Supine (3-5 sec hold) Long Arc Quad: Right;5 reps;Seated      Assessment/Plan    PT Assessment Patient needs continued PT services  PT Diagnosis Difficulty walking;Acute pain   PT Problem List Decreased strength;Pain;Decreased range of motion;Impaired sensation;Decreased activity tolerance;Decreased balance;Decreased knowledge of use of DME;Decreased mobility  PT Treatment Interventions Balance training;Gait training;Stair training;Functional mobility training;Patient/family education;Therapeutic activities;Therapeutic exercise;DME instruction   PT Goals (Current goals can be found in the Care Plan section) Acute Rehab PT Goals Patient Stated Goal: to return to independence PT Goal Formulation: With patient Time For Goal Achievement: 07/20/14 Potential to Achieve Goals: Good    Frequency BID   Barriers to discharge        Co-evaluation               End of Session Equipment Utilized During Treatment: Gait belt Activity Tolerance: Patient tolerated treatment well Patient left: in bed;with call bell/phone within reach;with bed alarm set;with family/visitor present Nurse Communication: Mobility status         Time: [id number] PT Time Calculation (min) (ACUTE ONLY): 23 min   Charges:   PT Evaluation $Initial PT Evaluation Tier I: 1 Procedure PT Treatments $Self Care/Home Management: 8-22   PT G Codes:        Jo-Ann Johanning A Kennia Vanvorst 07/06/2014, 4:42 PM Mylo RedShauna Jennife Zaucha, PT, DPT [phone]

## 2014-07-06 NOTE — Transfer of Care (Signed)
Immediate Anesthesia Transfer of Care Note  Patient: Kim [name]  Procedure(s) Performed: Procedure(s): RIGHT TOTAL KNEE ARTHROPLASTY (Right)  Patient Location: PACU  Anesthesia Type:MAC and Spinal  Level of Consciousness: awake, alert , oriented and patient cooperative  Airway & Oxygen Therapy: Patient Spontanous Breathing and Patient connected to face mask oxygen  Post-op Assessment: Report given to RN and Post -op Vital signs reviewed and stable  Post vital signs: Reviewed and stable  Last Vitals:  Filed Vitals:   07/06/14 0625  BP:   Pulse:   Temp: 36.6 C  Resp:     Complications: No apparent anesthesia complications

## 2014-07-06 NOTE — Anesthesia Procedure Notes (Addendum)
Procedure Name: MAC Date/Time: 07/06/2014 8:46 AM Performed by: Delphia GratesHANDLER, LORAINE Pre-anesthesia Checklist: Patient identified, Emergency Drugs available, Suction available and Patient being monitored Patient Re-evaluated:Patient Re-evaluated prior to inductionOxygen Delivery Method: Simple face mask Placement Confirmation: positive ETCO2   Spinal Patient location during procedure: OR Start time: 07/06/2014 8:39 AM End time: 07/06/2014 8:44 AM Staffing Anesthesiologist: Gaynelle AduFITZGERALD, Giannie Soliday Performed by: anesthesiologist  Preanesthetic Checklist Completed: patient identified, surgical consent, pre-op evaluation, timeout performed, IV checked, risks and benefits discussed and monitors and equipment checked Spinal Block Patient position: sitting Prep: Betadine Patient monitoring: cardiac monitor, continuous pulse ox and blood pressure Approach: midline Location: L3-4 Injection technique: single-shot Needle Needle type: Pencan  Needle gauge: 24 G Needle length: 9 cm Assessment Sensory level: T8 Additional Notes Functioning IV was confirmed and monitors were applied. Sterile prep and drape, including hand hygiene and sterile gloves were used. The patient was positioned and the spine was prepped. The skin was anesthetized with lidocaine.  Free flow of clear CSF was obtained prior to injecting local anesthetic into the CSF.  The spinal needle aspirated freely following injection.  The needle was carefully withdrawn.  The patient tolerated the procedure well.

## 2014-07-06 NOTE — Anesthesia Postprocedure Evaluation (Signed)
  Anesthesia Post-op Note  Patient: Kim [name]  Procedure(s) Performed: Procedure(s): RIGHT TOTAL KNEE ARTHROPLASTY (Right)  Patient Location: PACU  Anesthesia Type:Spinal  Level of Consciousness: awake, alert  and oriented  Airway and Oxygen Therapy: Patient Spontanous Breathing  Post-op Pain: none  Post-op Assessment: Post-op Vital signs reviewed, Patient's Cardiovascular Status Stable and Respiratory Function Stable  Post-op Vital Signs: Reviewed and stable  Last Vitals:  Filed Vitals:   07/06/14 1130  BP:   Pulse: 62  Temp:   Resp: 26    Complications: No apparent anesthesia complications

## 2014-07-06 NOTE — Discharge Summary (Addendum)
Patient ID: Kim [name] MRN: [id number] DOB/AGE: 11-19-1940 74 y.o.  Admit date: 07/06/2014 Discharge date: 07/08/2014  Admission Diagnoses:  Active Problems:   DJD (degenerative joint disease) of knee   Discharge Diagnoses:  Same  Past Medical History  Diagnosis Date  . Diabetes mellitus without complication   . Hypertension   . Seasonal allergies   . Hyperlipemia   . Constipation due to pain medication   . GERD (gastroesophageal reflux disease)   . Postinflammatory pulmonary fibrosis     Surgeries: Procedure(s): RIGHT TOTAL KNEE ARTHROPLASTY on 07/06/2014   Consultants:    Discharged Condition: Improved  Hospital Course: Kim [name] is an 74 y.o. female who was admitted 07/06/2014 for operative treatment of primary localized osteoarthritis right knee. Patient has severe unremitting pain that affects sleep, daily activities, and work/hobbies. After pre-op clearance the patient was taken to the operating room on 07/06/2014 and underwent  Procedure(s): RIGHT TOTAL KNEE ARTHROPLASTY.  Patient with a pre-op hb of 12.3 developed abla on pod#1 with a hb of 9.5.  Patient is currently asymptomatic but we will continue to follow.   Patient was given perioperative antibiotics:      Anti-infectives    Start     Dose/Rate Route Frequency Ordered Stop   07/07/14 0800  ciprofloxacin (CIPRO) tablet 250 mg     250 mg Oral 2 times daily 07/07/14 0865 07/09/14 0759   07/06/14 1230  ceFAZolin (ANCEF) IVPB 1 g/50 mL premix     1 g 100 mL/hr over 30 Minutes Intravenous Every 6 hours 07/06/14 1216 07/06/14 1834   07/06/14 0800  ceFAZolin (ANCEF) IVPB 2 g/50 mL premix     2 g 100 mL/hr over 30 Minutes Intravenous To Surgery 07/05/14 1354 07/06/14 0850       Patient was given sequential compression devices, early ambulation, and chemoprophylaxis to prevent DVT.  Patient benefited maximally from hospital stay and there were no complications.    Recent vital signs:  Patient Vitals  for the past 24 hrs:  BP Temp Temp src Pulse Resp SpO2  07/08/14 0456 (!) 129/41 mmHg 99.5 F (37.5 C) - (!) 115 18 93 %  07/07/14 2057 140/70 mmHg 98.8 F (37.1 C) Oral (!) 107 16 95 %  07/07/14 2020 - - - - - 96 %  07/07/14 1300 (!) 119/46 mmHg 97.8 F (36.6 C) - (!) 103 16 96 %     Recent laboratory studies:   Recent Labs  07/07/14 0524 07/08/14 0333  WBC 12.9* 12.3*  HGB 9.5* 8.4*  HCT 30.2* 26.3*  PLT 258 243  NA 141 136  K 4.6 3.9  CL 107 102  CO2 26 22  BUN 10 9  CREATININE 0.92 0.89  GLUCOSE 99 94  CALCIUM 8.4* 8.0*     Discharge Medications:     Medication List    STOP taking these medications        aspirin 81 MG tablet     polyethylene glycol packet  Commonly known as:  MIRALAX / GLYCOLAX     traMADol 50 MG tablet  Commonly known as:  ULTRAM      TAKE these medications        apixaban 2.5 MG Tabs tablet  Commonly known as:  ELIQUIS  Take 1 tab po q12 hours x 12 days following surgery to prevent blood clots     BAYER MICROLET LANCETS lancets  Use to check blood sugar once daily fasting, either before breakfast or post  prandial     bisacodyl 5 MG EC tablet  Commonly known as:  DULCOLAX  Take 1 tablet (5 mg total) by mouth daily as needed for moderate constipation.     budesonide-formoterol 160-4.5 MCG/ACT inhaler  Commonly known as:  SYMBICORT  Inhale 2 puffs into the lungs 2 (two) times daily.     dextromethorphan-guaiFENesin 10-100 MG/5ML liquid  Commonly known as:  ROBITUSSIN-DM  Take 2.5 mLs by mouth every 4 (four) hours as needed for cough.     famotidine 20 MG tablet  Commonly known as:  PEPCID  TAKE 1 TABLET BY MOUTH AT BEDTIME     fexofenadine-pseudoephedrine 180-240 MG per 24 hr tablet  Commonly known as:  ALLEGRA-D 24  Take 1 tablet by mouth daily.     glucose blood test strip  Commonly known as:  BAYER CONTOUR TEST  Use to check blood sugar once daily fasting, either before breakfast or post prandial      losartan-hydrochlorothiazide 100-12.5 MG per tablet  Commonly known as:  HYZAAR  Take 1 tablet by mouth daily.     metFORMIN 500 MG tablet  Commonly known as:  GLUCOPHAGE  TAKE 1 TABLET BY MOUTH TWICE A DAY     methocarbamol 500 MG tablet  Commonly known as:  ROBAXIN  Take 1 tablet (500 mg total) by mouth 4 (four) times daily.     ondansetron 4 MG tablet  Commonly known as:  ZOFRAN  Take 1 tablet (4 mg total) by mouth every 8 (eight) hours as needed for nausea or vomiting.     oxyCODONE-acetaminophen 5-325 MG per tablet  Commonly known as:  ROXICET  Take 1-2 tablets by mouth every 4 (four) hours as needed.     pantoprazole 40 MG tablet  Commonly known as:  PROTONIX  TAKE 1 TABLET BY MOUTH EVERY DAY TAKE 30-60 MINS. BEFORE FIRST MEAL OF THE DAY     simvastatin 40 MG tablet  Commonly known as:  ZOCOR  Take 40 mg by mouth daily.     Vitamin D 2000 UNITS Caps  Take 1 capsule by mouth daily.        Diagnostic Studies: Dg Knee Right Port  07/06/2014   CLINICAL DATA:  Status post total knee arthroplasty.  EXAM: PORTABLE RIGHT KNEE - 1-2 VIEW  COMPARISON:  None.  FINDINGS: The prosthetic components appear well seated and aligned. There is no evidence of an acute fracture or other operative complication.  IMPRESSION: Well-aligned right knee prosthetic components.   Electronically Signed   By: Amie Portlandavid  Ormond M.D.   On: 07/06/2014 12:31    Disposition:   Discharge Instructions    CPM    Complete by:  As directed   Continuous passive motion machine (CPM):      Use the CPM from 0- to 60 for 6 hours per day.      You may increase by 10 per day.  You may break it up into 2 or 3 sessions per day.      Use CPM for 2-3 weeks or until you are told to stop.     Call MD / Call 911    Complete by:  As directed   If you experience chest pain or shortness of breath, CALL 911 and be transported to the hospital emergency room.  If you develope a fever above 101 F, pus (white drainage) or  increased drainage or redness at the wound, or calf pain, call your surgeon's office.  Change dressing    Complete by:  As directed   Change dressing on saturday, then change the dressing daily with sterile 4 x 4 inch gauze dressing and apply TED hose.  You may clean the incision with alcohol prior to redressing.     Constipation Prevention    Complete by:  As directed   Drink plenty of fluids.  Prune juice may be helpful.  You may use a stool softener, such as Colace (over the counter) 100 mg twice a day.  Use MiraLax (over the counter) for constipation as needed.     Diet - low sodium heart healthy    Complete by:  As directed      Discharge instructions    Complete by:  As directed   INSTRUCTIONS AFTER JOINT REPLACEMENT   Remove items at home which could result in a fall. This includes throw rugs or furniture in walking pathways ICE to the affected joint every three hours while awake for 30 minutes at a time, for at least the first 3-5 days, and then as needed for pain and swelling.  Continue to use ice for pain and swelling. You may notice swelling that will progress down to the foot and ankle.  This is normal after surgery.  Elevate your leg when you are not up walking on it.   Continue to use the breathing machine you got in the hospital (incentive spirometer) which will help keep your temperature down.  It is common for your temperature to cycle up and down following surgery, especially at night when you are not up moving around and exerting yourself.  The breathing machine keeps your lungs expanded and your temperature down.  BLOOD THINNER: TAKE ELIQUIS AS DIRECTED FOR A TOTAL OF 12 DAYS FOLLOWING SURGERY TO PREVENT BLOOD CLOTS.  ONCE FINISHED WITH ELIQUIS, TAKE ASPIRIN 325MG  DAILY FOR THE NEXT TWO WEEKS.  ONCE FINISHED WITH ASPIRIN 325 MG, YOU MAY RESUME YOUR REGULAR DOSE OF ASPIRIN  ANTIBIOTIC: PICK UP PRESCRIPTION THAT WAS CALLED IN TO YOUR PHARMACY FOR YOUR URINARY TRACT INFECTION  AND START TAKING THIS MEDICATION AS SOON AS POSSIBLE    DIET:  As you were doing prior to hospitalization, we recommend a well-balanced diet.  DRESSING / WOUND CARE / SHOWERING  You may change your dressing 3-5 days after surgery.  Then change the dressing every day with sterile gauze.  Please use good hand washing techniques before changing the dressing.  Do not use any lotions or creams on the incision until instructed by your surgeon. and You may shower 3 days after surgery, but keep the wounds dry during showering.  You may use an occlusive plastic wrap (Press'n Seal for example), NO SOAKING/SUBMERGING IN THE BATHTUB.  If the bandage gets wet, change with a clean dry gauze.  If the incision gets wet, pat the wound dry with a clean towel.  ACTIVITY  Increase activity slowly as tolerated, but follow the weight bearing instructions below.   No driving for 6 weeks or until further direction given by your physician.  You cannot drive while taking narcotics.  No lifting or carrying greater than 10 lbs. until further directed by your surgeon. Avoid periods of inactivity such as sitting longer than an hour when not asleep. This helps prevent blood clots.  You may return to work once you are authorized by your doctor.     WEIGHT BEARING   Weight bearing as tolerated with assist device (walker, cane, etc) as directed, use it  as long as suggested by your surgeon or therapist, typically at least 4-6 weeks.   EXERCISES  Results after joint replacement surgery are often greatly improved when you follow the exercise, range of motion and muscle strengthening exercises prescribed by your doctor. Safety measures are also important to protect the joint from further injury. Any time any of these exercises cause you to have increased pain or swelling, decrease what you are doing until you are comfortable again and then slowly increase them. If you have problems or questions, call your caregiver or physical  therapist for advice.   Rehabilitation is important following a joint replacement. After just a few days of immobilization, the muscles of the leg can become weakened and shrink (atrophy).  These exercises are designed to build up the tone and strength of the thigh and leg muscles and to improve motion. Often times heat used for twenty to thirty minutes before working out will loosen up your tissues and help with improving the range of motion but do not use heat for the first two weeks following surgery (sometimes heat can increase post-operative swelling).   These exercises can be done on a training (exercise) mat, on the floor, on a table or on a bed. Use whatever works the best and is most comfortable for you.    Use music or television while you are exercising so that the exercises are a pleasant break in your day. This will make your life better with the exercises acting as a break in your routine that you can look forward to.   Perform all exercises about fifteen times, three times per day or as directed.  You should exercise both the operative leg and the other leg as well.  Exercises include:   Quad Sets - Tighten up the muscle on the front of the thigh (Quad) and hold for 5-10 seconds.   Straight Leg Raises - With your knee straight (if you were given a brace, keep it on), lift the leg to 60 degrees, hold for 3 seconds, and slowly lower the leg.  Perform this exercise against resistance later as your leg gets stronger.  Leg Slides: Lying on your back, slowly slide your foot toward your buttocks, bending your knee up off the floor (only go as far as is comfortable). Then slowly slide your foot back down until your leg is flat on the floor again.  Angel Wings: Lying on your back spread your legs to the side as far apart as you can without causing discomfort.  Hamstring Strength:  Lying on your back, push your heel against the floor with your leg straight by tightening up the muscles of your  buttocks.  Repeat, but this time bend your knee to a comfortable angle, and push your heel against the floor.  You may put a pillow under the heel to make it more comfortable if necessary.   A rehabilitation program following joint replacement surgery can speed recovery and prevent re-injury in the future due to weakened muscles. Contact your doctor or a physical therapist for more information on knee rehabilitation.    CONSTIPATION  Constipation is defined medically as fewer than three stools per week and severe constipation as less than one stool per week.  Even if you have a regular bowel pattern at home, your normal regimen is likely to be disrupted due to multiple reasons following surgery.  Combination of anesthesia, postoperative narcotics, change in appetite and fluid intake all can affect your bowels.  YOU MUST use at least one of the following options; they are listed in order of increasing strength to get the job done.  They are all available over the counter, and you may need to use some, POSSIBLY even all of these options:    Drink plenty of fluids (prune juice may be helpful) and high fiber foods Colace 100 mg by mouth twice a day  Senokot for constipation as directed and as needed Dulcolax (bisacodyl), take with full glass of water  Miralax (polyethylene glycol) once or twice a day as needed.  If you have tried all these things and are unable to have a bowel movement in the first 3-4 days after surgery call either your surgeon or your primary doctor.    If you experience loose stools or diarrhea, hold the medications until you stool forms back up.  If your symptoms do not get better within 1 week or if they get worse, check with your doctor.  If you experience "the worst abdominal pain ever" or develop nausea or vomiting, please contact the office immediately for further recommendations for treatment.   ITCHING:  If you experience itching with your medications, try taking only a  single pain pill, or even half a pain pill at a time.  You can also use Benadryl over the counter for itching or also to help with sleep.   TED HOSE STOCKINGS:  Use stockings on both legs until for at least 2 weeks or as directed by physician office. They may be removed at night for sleeping.  MEDICATIONS:  See your medication summary on the "After Visit Summary" that nursing will review with you.  You may have some home medications which will be placed on hold until you complete the course of blood thinner medication.  It is important for you to complete the blood thinner medication as prescribed.  PRECAUTIONS:  If you experience chest pain or shortness of breath - call 911 immediately for transfer to the hospital emergency department.   If you develop a fever greater that 101 F, purulent drainage from wound, increased redness or drainage from wound, foul odor from the wound/dressing, or calf pain - CONTACT YOUR SURGEON.                                                   FOLLOW-UP APPOINTMENTS:  If you do not already have a post-op appointment, please call the office for an appointment to be seen by your surgeon.  Guidelines for how soon to be seen are listed in your "After Visit Summary", but are typically between 1-4 weeks after surgery.  OTHER INSTRUCTIONS:   Knee Replacement:  Do not place pillow under knee, focus on keeping the knee straight while resting. CPM instructions: 0-90 degrees, 2 hours in the morning, 2 hours in the afternoon, and 2 hours in the evening. Place foam block, curve side up under heel at all times except when in CPM or when walking.  DO NOT modify, tear, cut, or change the foam block in any way.  MAKE SURE YOU:  Understand these instructions.  Get help right away if you are not doing well or get worse.     Do not put a pillow under the knee. Place it under the heel.    Complete by:  As directed   Place gray foam  under operative heel when in bed or in a chair to work on  extension     Increase activity slowly as tolerated    Complete by:  As directed      TED hose    Complete by:  As directed   Use stockings (TED hose) for 2 weeks on both leg(s).  You may remove them at night for sleeping.           Follow-up Information    Follow up with [facility], MD. Schedule an appointment as soon as possible for a visit in 2 weeks.   Specialty:  Orthopedic Surgery   Contact information:   [address] [place] [address] [phone]       Follow up with [facility].   Why:  Someone from [facility] will contact you concerning start date and time for therapy.   Contact information:   [address] [place] [address] [phone]        Signed: Otilio Saber 07/08/2014, 7:52 AM

## 2014-07-07 ENCOUNTER — Encounter (HOSPITAL_COMMUNITY): Payer: Self-pay | Admitting: Orthopedic Surgery

## 2014-07-07 LAB — BASIC METABOLIC PANEL
ANION GAP: 8 (ref 5–15)
BUN: 10 mg/dL (ref 6–20)
CO2: 26 mmol/L (ref 22–32)
Calcium: 8.4 mg/dL — ABNORMAL LOW (ref 8.9–10.3)
Chloride: 107 mmol/L (ref 101–111)
Creatinine, Ser: 0.92 mg/dL (ref 0.44–1.00)
GFR calc non Af Amer: >60 mL/min (ref >60)
Glucose, Bld: 99 mg/dL (ref 65–99)
Potassium: 4.6 mmol/L (ref 3.5–5.1)
Sodium: 141 mmol/L (ref 135–145)

## 2014-07-07 LAB — CBC
HEMATOCRIT: 30.2 % — AB (ref 36.0–46.0)
Hemoglobin: 9.5 g/dL — ABNORMAL LOW (ref 12.0–15.0)
MCH: 25.9 pg — ABNORMAL LOW (ref 26.0–34.0)
MCHC: 31.5 g/dL (ref 30.0–36.0)
MCV: 82.3 fL (ref 78.0–100.0)
PLATELETS: 258 10*3/uL (ref 150–400)
RBC: 3.67 MIL/uL — ABNORMAL LOW (ref 3.87–5.11)
RDW: 14.5 % (ref 11.5–15.5)
WBC: 12.9 10*3/uL — AB (ref 4.0–10.5)

## 2014-07-07 LAB — GLUCOSE, CAPILLARY
GLUCOSE-CAPILLARY: 97 mg/dL (ref 65–99)
GLUCOSE-CAPILLARY: 135 mg/dL — AB (ref 65–99)
Glucose-Capillary: 128 mg/dL — ABNORMAL HIGH (ref 65–99)
Glucose-Capillary: 100 mg/dL — ABNORMAL HIGH (ref 65–99)
Glucose-Capillary: 112 mg/dL — ABNORMAL HIGH (ref 65–99)

## 2014-07-07 MED ORDER — CIPROFLOXACIN HCL 500 MG PO TABS
250.0000 mg | ORAL_TABLET | Freq: Two times a day (BID) | ORAL | Status: AC
  Administered 2014-07-07 – 2014-07-08 (×4): 250 mg via ORAL
  Filled 2014-07-07 (×4): qty 1

## 2014-07-07 MED ORDER — BISACODYL 10 MG RE SUPP
10.0000 mg | Freq: Every day | RECTAL | Status: DC | PRN
  Administered 2014-07-07: 10 mg via RECTAL
  Filled 2014-07-07: qty 1

## 2014-07-07 NOTE — Discharge Instructions (Signed)
INSTRUCTIONS AFTER JOINT REPLACEMENT   o Remove items at home which could result in a fall. This includes throw rugs or furniture in walking pathways o ICE to the affected joint every three hours while awake for 30 minutes at a time, for at least the first 3-5 days, and then as needed for pain and swelling.  Continue to use ice for pain and swelling. You may notice swelling that will progress down to the foot and ankle.  This is normal after surgery.  Elevate your leg when you are not up walking on it.   o Continue to use the breathing machine you got in the hospital (incentive spirometer) which will help keep your temperature down.  It is common for your temperature to cycle up and down following surgery, especially at night when you are not up moving around and exerting yourself.  The breathing machine keeps your lungs expanded and your temperature down.  BLOOD THINNER: TAKE ELIQUIS AS DIRECTED FOR A TOTAL OF 12 DAYS FOLLOWING SURGERY TO PREVENT BLOOD CLOTS.  ONCE FINISHED WITH ELIQUIS, TAKE ASPIRIN  DAILY FOR THE NEXT TWO WEEKS.  ONCE FINISHED WITH ASPIRIN 325 MG, YOU MAY RESUME YOUR REGULAR DOSE OF ASPIRIN  ANTIBIOTIC: PICK UP PRESCRIPTION THAT WAS CALLED IN TO YOUR PHARMACY FOR YOUR URINARY TRACT INFECTION AND START TAKING THIS MEDICATION AS SOON AS POSSIBLE    DIET:  As you were doing prior to hospitalization, we recommend a well-balanced diet.  DRESSING / WOUND CARE / SHOWERING  You may change your dressing 3-5 days after surgery.  Then change the dressing every day with sterile gauze.  Please use good hand washing techniques before changing the dressing.  Do not use any lotions or creams on the incision until instructed by your surgeon. and You may shower 3 days after surgery, but keep the wounds dry during showering.  You may use an occlusive plastic wrap (Press'n Seal for example), NO SOAKING/SUBMERGING IN THE BATHTUB.  If the bandage gets wet, change with a clean dry gauze.  If the  incision gets wet, pat the wound dry with a clean towel.  ACTIVITY  o Increase activity slowly as tolerated, but follow the weight bearing instructions below.   o No driving for 6 weeks or until further direction given by your physician.  You cannot drive while taking narcotics.  o No lifting or carrying greater than 10 lbs. until further directed by your surgeon. o Avoid periods of inactivity such as sitting longer than an hour when not asleep. This helps prevent blood clots.  o You may return to work once you are authorized by your doctor.     WEIGHT BEARING   Weight bearing as tolerated with assist device (walker, cane, etc) as directed, use it as long as suggested by your surgeon or therapist, typically at least 4-6 weeks.   EXERCISES  Results after joint replacement surgery are often greatly improved when you follow the exercise, range of motion and muscle strengthening exercises prescribed by your doctor. Safety measures are also important to protect the joint from further injury. Any time any of these exercises cause you to have increased pain or swelling, decrease what you are doing until you are comfortable again and then slowly increase them. If you have problems or questions, call your caregiver or physical therapist for advice.   Rehabilitation is important following a joint replacement. After just a few days of immobilization, the muscles of the leg can become weakened and shrink (atrophy).  These exercises are designed to build up the tone and strength of the thigh and leg muscles and to improve motion. Often times heat used for twenty to thirty minutes before working out will loosen up your tissues and help with improving the range of motion but do not use heat for the first two weeks following surgery (sometimes heat can increase post-operative swelling).   These exercises can be done on a training (exercise) mat, on the floor, on a table or on a bed. Use whatever works the  best and is most comfortable for you.    Use music or television while you are exercising so that the exercises are a pleasant break in your day. This will make your life better with the exercises acting as a break in your routine that you can look forward to.   Perform all exercises about fifteen times, three times per day or as directed.  You should exercise both the operative leg and the other leg as well.  Exercises include:    Quad Sets - Tighten up the muscle on the front of the thigh (Quad) and hold for 5-10 seconds.    Straight Leg Raises - With your knee straight (if you were given a brace, keep it on), lift the leg to 60 degrees, hold for 3 seconds, and slowly lower the leg.  Perform this exercise against resistance later as your leg gets stronger.   Leg Slides: Lying on your back, slowly slide your foot toward your buttocks, bending your knee up off the floor (only go as far as is comfortable). Then slowly slide your foot back down until your leg is flat on the floor again.   Angel Wings: Lying on your back spread your legs to the side as far apart as you can without causing discomfort.   Hamstring Strength:  Lying on your back, push your heel against the floor with your leg straight by tightening up the muscles of your buttocks.  Repeat, but this time bend your knee to a comfortable angle, and push your heel against the floor.  You may put a pillow under the heel to make it more comfortable if necessary.   A rehabilitation program following joint replacement surgery can speed recovery and prevent re-injury in the future due to weakened muscles. Contact your doctor or a physical therapist for more information on knee rehabilitation.    CONSTIPATION  Constipation is defined medically as fewer than three stools per week and severe constipation as less than one stool per week.  Even if you have a regular bowel pattern at home, your normal regimen is likely to be disrupted due to multiple  reasons following surgery.  Combination of anesthesia, postoperative narcotics, change in appetite and fluid intake all can affect your bowels.   YOU MUST use at least one of the following options; they are listed in order of increasing strength to get the job done.  They are all available over the counter, and you may need to use some, POSSIBLY even all of these options:    Drink plenty of fluids (prune juice may be helpful) and high fiber foods Colace 100 mg by mouth twice a day  Senokot for constipation as directed and as needed Dulcolax (bisacodyl), take with full glass of water  Miralax (polyethylene glycol) once or twice a day as needed.  If you have tried all these things and are unable to have a bowel movement in the first 3-4 days after surgery call either  your surgeon or your primary doctor.    If you experience loose stools or diarrhea, hold the medications until you stool forms back up.  If your symptoms do not get better within 1 week or if they get worse, check with your doctor.  If you experience "the worst abdominal pain ever" or develop nausea or vomiting, please contact the office immediately for further recommendations for treatment.   ITCHING:  If you experience itching with your medications, try taking only a single pain pill, or even half a pain pill at a time.  You can also use Benadryl over the counter for itching or also to help with sleep.   TED HOSE STOCKINGS:  Use stockings on both legs until for at least 2 weeks or as directed by physician office. They may be removed at night for sleeping.  MEDICATIONS:  See your medication summary on the After Visit Summary that nursing will review with you.  You may have some home medications which will be placed on hold until you complete the course of blood thinner medication.  It is important for you to complete the blood thinner medication as prescribed.  PRECAUTIONS:  If you experience chest pain or shortness of breath - call  [phone] immediately for transfer to the hospital emergency department.   If you develop a fever greater that 101 F, purulent drainage from wound, increased redness or drainage from wound, foul odor from the wound/dressing, or calf pain - CONTACT YOUR SURGEON.                                                   FOLLOW-UP APPOINTMENTS:  If you do not already have a post-op appointment, please call the office for an appointment to be seen by your surgeon.  Guidelines for how soon to be seen are listed in your After Visit Summary, but are typically between 1-4 weeks after surgery.  OTHER INSTRUCTIONS:   Knee Replacement:  Do not place pillow under knee, focus on keeping the knee straight while resting. CPM instructions: 0-90 degrees, 2 hours in the morning, 2 hours in the afternoon, and 2 hours in the evening. Place foam block, curve side up under heel at all times except when in CPM or when walking.  DO NOT modify, tear, cut, or change the foam block in any way.  MAKE SURE YOU:   Understand these instructions.   Get help right away if you are not doing well or get worse.    Thank you for letting us be a part of your medical care team.  It is a privilege we respect greatly.  We hope these instructions will help you stay on track for a fast and full recovery!   Information on my medicine - ELIQUIS (apixaban)  This medication education was reviewed with me or my healthcare representative as part of my discharge preparation.  The pharmacist that spoke with me during my hospital stay was:  Rolley SimsMartin, Dosia Yodice Ann, [facility]PH  Why was Eliquis prescribed for you? Eliquis was prescribed for you to reduce the risk of blood clots forming after orthopedic surgery.    What do You need to know about Eliquis? Take your Eliquis TWICE DAILY - one tablet in the morning and one tablet in the evening with or without food.  It would be best to take the dose  about the same time each day.  If you have difficulty  swallowing the tablet whole please discuss with your pharmacist how to take the medication safely.  Take Eliquis exactly as prescribed by your doctor and DO NOT stop taking Eliquis without talking to the doctor who prescribed the medication.  Stopping without other medication to take the place of Eliquis may increase your risk of developing a clot.  After discharge, you should have regular check-up appointments with your healthcare provider that is prescribing your Eliquis.  What do you do if you miss a dose? If a dose of ELIQUIS is not taken at the scheduled time, take it as soon as possible on the same day and twice-daily administration should be resumed.  The dose should not be doubled to make up for a missed dose.  Do not take more than one tablet of ELIQUIS at the same time.  Important Safety Information A possible side effect of Eliquis is bleeding. You should call your healthcare provider right away if you experience any of the following: ? Bleeding from an injury or your nose that does not stop. ? Unusual colored urine (red or dark brown) or unusual colored stools (red or black). ? Unusual bruising for unknown reasons. ? A serious fall or if you hit your head (even if there is no bleeding).  Some medicines may interact with Eliquis and might increase your risk of bleeding or clotting while on Eliquis. To help avoid this, consult your healthcare provider or pharmacist prior to using any new prescription or non-prescription medications, including herbals, vitamins, non-steroidal anti-inflammatory drugs (NSAIDs) and supplements.  This website has more information on Eliquis (apixaban): http://www.eliquis.com/eliquis/home

## 2014-07-07 NOTE — Progress Notes (Signed)
Subjective: 1 Day Post-Op Procedure(s) (LRB): RIGHT TOTAL KNEE ARTHROPLASTY (Right) Patient reports pain as mild.  Patient experienced mild nausea yesterday.  No vomiting.  No lightheadedness/dizziness or itching from meds.  Tolerating diet.  Objective: Vital signs in last 24 hours: Temp:  [97.4 F (36.3 C)-98.4 F (36.9 C)] 97.7 F (36.5 C) (05/12 0505) Pulse Rate:  [59-90] 82 (05/12 0505) Resp:  [16-28] 16 (05/12 0505) BP: (107-136)/(46-93) 107/46 mmHg (05/12 0505) SpO2:  [92 %-100 %] 95 % (05/12 0505)  Intake/Output from previous day: 05/11 0701 - 05/12 0700 In: 2680 [P.O.:480; I.V.:2200] Out: 1100 [Urine:550; Drains:500; Blood:50] Intake/Output this shift: Total I/O In: 1440 [P.O.:240; I.V.:1200] Out: 150 [Drains:150]  No results for input(s): HGB in the last 72 hours. No results for input(s): WBC, RBC, HCT, PLT in the last 72 hours. No results for input(s): NA, K, CL, CO2, BUN, CREATININE, GLUCOSE, CALCIUM in the last 72 hours. No results for input(s): LABPT, INR in the last 72 hours.  Neurologically intact Neurovascular intact Sensation intact distally Intact pulses distally Dorsiflexion/Plantar flexion intact Compartment soft  hemovac drain pulled by me today Negative homans bilaterally  Assessment/Plan: 1 Day Post-Op Procedure(s) (LRB): RIGHT TOTAL KNEE ARTHROPLASTY (Right) Advance diet Up with therapy Plan for discharge tomorrow with hhpt UTI-patient did not start antibiotics pre-op for UTI, so I will put order in to start them today WBAT RLE Dry dressing change prn  Otilio SaberM Lindsey Francene Mcerlean 07/07/2014, 6:34 AM

## 2014-07-07 NOTE — Op Note (Signed)
NAMFredrik Cove:  Brooker, Samadhi              ACCOUNT NO.:  [id number]  MEDICAL RECORD NO.:  [id number]  LOCATION:  [id number]                        FACILITY:  [facility]  PHYSICIAN:  Loreta Aveaniel F. Murphy, M.D. DATE OF BIRTH:  08-28-1940  DATE OF PROCEDURE:  07/06/2014 DATE OF DISCHARGE:                              OPERATIVE REPORT   PREOPERATIVE DIAGNOSIS:  Right knee end-stage arthritis, primary, localized.  POSTOPERATIVE DIAGNOSIS:  Right knee end-stage arthritis, primary, localized.  PROCEDURE:  Right knee modified minimally invasive total knee replacement, Stryker triathlon prosthesis.  Cemented pegged posterior stabilized #4 femoral component.  Cemented #5 tibial component, 9-mm polyethylene insert.  Cemented resurfacing 32-mm patellar component. Soft tissue balancing.  SURGEON:  Loreta Aveaniel F. Murphy, M.D.  ASSISTANT:  Mikey KirschnerLindsey Stanberry, PA, present throughout the entire case and necessary for timely completion of procedure.  ANESTHESIA:  Spinal.  BLOOD LOSS:  Minimal.  SPECIMENS:  None.  CULTURES:  None.  COMPLICATIONS:  None.  DRESSINGS:  Soft compressive knee immobilizer.  TOURNIQUET TIME:  45 minutes.  DRAINS:  Hemovac x1.  DESCRIPTION OF PROCEDURE:  The patient was brought to the operating room, placed on the operating table in supine position.  After adequate anesthesia had been obtained, tourniquet applied, and prepped and draped in usual sterile fashion.  Exsanguinated with elevation of Esmarch and tourniquet inflated to 350 mmHg.  Straight incision above the patella and down to tibial tubercle.  Medial arthrotomy, vastus splitting, preserving quad tendon.  Medial capsule release.  Intramedullary guide, distal femur, 5 degrees of valgus.  An 8-mm resection.  Using epicondylar axis, the femur was sized, cut, and fitted for posterior stabilized pegged #4 component.  Extramedullary guide on the tibia.  A 3- degree posterior slope cut.  Size #5 component.  Patella  exposed. Posterior 10-mm was removed.  Drilled, sized, and fitted for a 32-mm component.  Trials were put in place.  With the 9-mm insert, I was very pleased with balancing, flexion, extension, stability as well as patellar tracking.  Tibia was marked for rotation and hand reamed.  All trials were removed.  Copious irrigation with pulse irrigating device. Cement was prepared and placed on all components, firmly seated. Polyethylene attached to tibia, knee reduced.  Patella held with a clamp.  Once the cement hardened, the knee was irrigated again.  Soft tissue was injected with Exparel.  Hemovac placed.  Arthrotomy was closed with Ethibond subcutaneous and subcuticular closure.  Margins were injected with Marcaine.  Sterile compressive dressing applied. Tourniquet was deflated and removed.  Knee immobilizer applied. Anesthesia reversed.  Brought to the recovery room.  Tolerated the surgery well.  No complications.     Loreta Aveaniel F. Murphy, M.D.     DFM/MEDQ  D:  07/06/2014  T:  07/07/2014  Job:  [id number]

## 2014-07-07 NOTE — Progress Notes (Signed)
Physical Therapy Treatment Patient Details Name: Kim [name] L Tolles MRN: [id number] DOB: 12/21/1940 Today's Date: 07/07/2014    History of Present Illness Patient is a 74 y/o female s/p R TKA. PMH of HTN, HLD, DM    PT Comments    Patient progressing well with mobility. Improved ambulation distance today. Reviewed HEP. Will attempt stair negotiation in PM session to prepare pt for discharge home tomorrow. Highly motivated. Will continue to follow and progress to maximize independence.   Follow Up Recommendations  Home health PT;Supervision/Assistance - 24 hour     Equipment Recommendations  None recommended by PT    Recommendations for Other Services       Precautions / Restrictions Precautions Precautions: Fall;Knee Precaution Booklet Issued: Yes (comment) Precaution Comments: Reviewed precautions and HEP. Restrictions Weight Bearing Restrictions: Yes RLE Weight Bearing: Weight bearing as tolerated    Mobility  Bed Mobility Overal bed mobility: Needs Assistance Bed Mobility: Supine to Sit     Supine to sit: Supervision;HOB elevated     General bed mobility comments: Supervision for safety. Able to bring RLE to EOB without assist.   Transfers Overall transfer level: Needs assistance Equipment used: Rolling walker (2 wheeled) Transfers: Sit to/from Stand Sit to Stand: Min guard         General transfer comment: Min guard for safety. Cues for hand placement. Transferred to chair post ambulation.  Ambulation/Gait Ambulation/Gait assistance: Min guard Ambulation Distance (Feet): 100 Feet Assistive device: Rolling walker (2 wheeled) Gait Pattern/deviations: Step-to pattern;Decreased stance time - right;Decreased step length - left;Trunk flexed;Antalgic   Gait velocity interpretation: Below normal speed for age/gender General Gait Details: Cues for RW management/proximity.  Dyspnea present. Vitals stable.   Stairs            Wheelchair Mobility     Modified Rankin (Stroke Patients Only)       Balance Overall balance assessment: Needs assistance Sitting-balance support: Feet supported;No upper extremity supported Sitting balance-Leahy Scale: Good     Standing balance support: During functional activity Standing balance-Leahy Scale: Poor                      Cognition Arousal/Alertness: Awake/alert Behavior During Therapy: WFL for tasks assessed/performed Overall Cognitive Status: Within Functional Limits for tasks assessed                      Exercises Total Joint Exercises Ankle Circles/Pumps: Both;15 reps;Supine Quad Sets: Right;10 reps;Supine Heel Slides: Right;5 reps;Seated Long Arc Quad: Right;10 reps;AAROM;Seated Goniometric ROM: -8-80 degrees knee AROM    General Comments        Pertinent Vitals/Pain Pain Assessment: Faces Faces Pain Scale: Hurts little more Pain Location: right knee with movement Pain Descriptors / Indicators: Sore;Aching Pain Intervention(s): Monitored during session;Repositioned;Premedicated before session    Home Living                      Prior Function            PT Goals (current goals can now be found in the care plan section) Progress towards PT goals: Progressing toward goals    Frequency  BID    PT Plan Current plan remains appropriate    Co-evaluation             End of Session Equipment Utilized During Treatment: Gait belt Activity Tolerance: Patient tolerated treatment well Patient left: in chair;with call bell/phone within reach     Time: [id number]  PT Time Calculation (min) (ACUTE ONLY): 25 min  Charges:  $Gait Training: 8-22 mins $Therapeutic Exercise: 8-22 mins                    G Codes:      Honorio Devol A Bella Brummet 07/07/2014, 9:45 AM  Mylo RedShauna Davidlee Jeanbaptiste, PT, DPT [phone]

## 2014-07-07 NOTE — Progress Notes (Signed)
Physical Therapy Treatment Patient Details Name: Kim [name] MRN: [id number] DOB: [birth year] Today's Date: 07/07/2014    History of Present Illness Patient is a 74 y/o female s/p R TKA. PMH of HTN, HLD, DM    PT Comments    Patient progressing slowly towards PT goals. Increased pain today limiting mobility. Tolerated stair negotiation with Min A for balance/safety. Pt will need to navigate 4 steps in AM session tomorrow prior to d/c. Will continue to follow and progress as tolerated to maximize independence.   Follow Up Recommendations  Home health PT;Supervision/Assistance - 24 hour     Equipment Recommendations  None recommended by PT    Recommendations for Other Services       Precautions / Restrictions Precautions Precautions: Fall;Knee Precaution Comments: Reviewed precautions and HEP. Required Braces or Orthoses: Knee Immobilizer - Right Restrictions Weight Bearing Restrictions: Yes RLE Weight Bearing: Weight bearing as tolerated    Mobility  Bed Mobility Overal bed mobility: Needs Assistance Bed Mobility: Supine to Sit;Sit to Supine     Supine to sit: HOB elevated;Min guard Sit to supine: Min assist   General bed mobility comments: Min A to bring RLE into bed with KI donned. ABle to get to EOB without assist.   Transfers Overall transfer level: Needs assistance Equipment used: Rolling walker (2 wheeled) Transfers: Sit to/from Stand Sit to Stand: Min assist         General transfer comment: Min A to rise from EOB x1, [facility], from chair x1.  Cues for hand placement.  Ambulation/Gait Ambulation/Gait assistance: Min guard Ambulation Distance (Feet): 100 Feet Assistive device: Rolling walker (2 wheeled) Gait Pattern/deviations: Step-to pattern;Decreased stance time - right;Decreased step length - left;Trunk flexed;Antalgic   Gait velocity interpretation: Below normal speed for age/gender General Gait Details: Cues for RW management/proximity.   Dyspnea present.   Stairs Stairs: Yes Stairs assistance: Min assist Stair Management: Two rails;Step to pattern Number of Stairs: 2 General stair comments: Cues for technique.   Wheelchair Mobility    Modified Rankin (Stroke Patients Only)       Balance Overall balance assessment: Needs assistance Sitting-balance support: Feet supported;No upper extremity supported Sitting balance-Leahy Scale: Good     Standing balance support: During functional activity Standing balance-Leahy Scale: Poor                      Cognition Arousal/Alertness: Awake/alert Behavior During Therapy: WFL for tasks assessed/performed Overall Cognitive Status: Within Functional Limits for tasks assessed                      Exercises      General Comments        Pertinent Vitals/Pain Pain Assessment: 0-10 Pain Score: 7  Pain Location: right knee Pain Descriptors / Indicators: Sore;Aching Pain Intervention(s): Limited activity within patient's tolerance;Repositioned;Monitored during session;Premedicated before session    Home Living Family/patient expects to be discharged to:: Private residence Living Arrangements: Children;Spouse/significant other Available Help at Discharge: Family;Available 24 hours/day Type of Home: House Home Access: Stairs to enter Entrance Stairs-Rails: Right;Left Home Layout: One level Home Equipment: Environmental consultantWalker - 2 wheels;Bedside commode;Cane - single point      Prior Function Level of Independence: Independent with assistive device(s)      Comments: Pt using SPC for community ambulation. Independent for household ambulation.   PT Goals (current goals can now be found in the care plan section) Acute Rehab PT Goals Patient Stated Goal: to return  to independence Progress towards PT goals: Progressing toward goals    Frequency  BID    PT Plan Current plan remains appropriate    Co-evaluation             End of Session Equipment  Utilized During Treatment: Gait belt Activity Tolerance: Patient limited by pain Patient left: in bed;with call bell/phone within reach;with bed alarm set     Time: [id number] PT Time Calculation (min) (ACUTE ONLY): 26 min  Charges:  $Gait Training: 23-37 mins                    G Codes:      Clinton Dragone A Lakeisha Waldrop 07/07/2014, 2:31 PM Mylo RedShauna Linley Moxley, PT, DPT [phone]

## 2014-07-07 NOTE — Care Management Note (Signed)
Case Management Note  Patient Details  Name: Kim [name] MRN: [id number] Date of Birth: 1941/02/21  Subjective/Objective:     74 yr old female admitted with DJD of the right knee. Patient underwent a right total knee arthroplasty.               Action/Plan:   Case manager spoke with patient concerning home health and DME needs. Choice offered. Patient states she wants to use [facility]. Referral called to Tiffany, [facility] Liaison. Patient states she has family support at discharge.      Expected Discharge Date: 07/08/14                 Expected Discharge Plan: Home with Home Health     In-House Referral:  NA  Discharge planning Services  CM Consult  Post Acute Care Choice:  Home Health Choice offered to:  Patient  DME Arranged:  3-N-1, CPM, Walker rolling DME Agency:  [employer]  HH Arranged:  PT HH Agency:  [facility]  Status of Service:  Completed, signed off  Medicare Important Message Given:    Date Medicare IM Given:    Medicare IM give by:    Date Additional Medicare IM Given:    Additional Medicare Important Message give by:     If discussed at Long Length of Stay Meetings, dates discussed:    Additional Comments:  Durenda GuthrieBrady, Takia Runyon Naomi, RN 07/07/2014, 1:29 PM

## 2014-07-07 NOTE — Evaluation (Signed)
Occupational Therapy Evaluation Patient Details Name: Kim [name] MRN: [id number] DOB: 09-14-1940 Today's Date: 07/07/2014    History of Present Illness Patient is a 74 y/o female s/p R TKA. PMH of HTN, HLD, DM   Clinical Impression   Pt is s/p TKA resulting in the deficits listed below (see OT Problem List). Pt will benefit from skilled OT to increase their safety and independence with ADL and functional mobility for ADL to facilitate discharge to venue listed below.        Follow Up Recommendations  No OT follow up;Supervision - Intermittent    Equipment Recommendations  None recommended by OT    Recommendations for Other Services       Precautions / Restrictions Precautions Precautions: Fall;Knee Precaution Booklet Issued: Yes (comment) Precaution Comments: Reviewed precautions and HEP. Restrictions Weight Bearing Restrictions: Yes RLE Weight Bearing: Weight bearing as tolerated      Mobility Bed Mobility Overal bed mobility: Needs Assistance Bed Mobility: Supine to Sit     Supine to sit: Supervision;HOB elevated     General bed mobility comments: pt in chair  Transfers Overall transfer level: Needs assistance Equipment used: Rolling walker (2 wheeled) Transfers: Sit to/from Stand Sit to Stand: Min assist         General transfer comment: Cues for hand placement.     Balance Overall balance assessment: Needs assistance Sitting-balance support: Feet supported;No upper extremity supported Sitting balance-Leahy Scale: Good     Standing balance support: During functional activity Standing balance-Leahy Scale: Poor                              ADL Overall ADL's : Needs assistance/impaired Eating/Feeding: Set up;Sitting   Grooming: Set up;Sitting   Upper Body Bathing: Set up;Sitting   Lower Body Bathing: Moderate assistance;Sit to/from stand   Upper Body Dressing : Set up;Sitting   Lower Body Dressing: Moderate assistance;Sit  to/from stand   Toilet Transfer: Minimal assistance;Comfort height toilet;Ambulation;RW   Toileting- Clothing Manipulation and Hygiene: Moderate assistance;Sit to/from stand                         Pertinent Vitals/Pain Pain Assessment: Faces Faces Pain Scale: Hurts little more Pain Location: right knee with movement Pain Descriptors / Indicators: Sore;Aching Pain Intervention(s): Monitored during session;Repositioned;Premedicated before session     Hand Dominance     Extremity/Trunk Assessment Upper Extremity Assessment Upper Extremity Assessment: Overall WFL for tasks assessed           Communication Communication Communication: No difficulties   Cognition Arousal/Alertness: Awake/alert Behavior During Therapy: WFL for tasks assessed/performed Overall Cognitive Status: Within Functional Limits for tasks assessed                     General Comments               Home Living Family/patient expects to be discharged to:: Private residence Living Arrangements: Children;Spouse/significant other Available Help at Discharge: Family;Available 24 hours/day Type of Home: House Home Access: Stairs to enter Entergy CorporationEntrance Stairs-Number of Steps: 4-6 Entrance Stairs-Rails: Right;Left Home Layout: One level     Bathroom Shower/Tub: Chief Strategy OfficerTub/shower unit   Bathroom Toilet: Standard     Home Equipment: Environmental consultantWalker - 2 wheels;Bedside commode;Cane - single point          Prior Functioning/Environment Level of Independence: Independent with assistive device(s)  Comments: Pt using SPC for community ambulation. Independent for household ambulation.    OT Diagnosis: Generalized weakness;Acute pain   OT Problem List: Decreased strength   OT Treatment/Interventions: Self-care/ADL training;DME and/or AE instruction;Patient/family education    OT Goals(Current goals can be found in the care plan section) Acute Rehab OT Goals Patient Stated Goal: to return to  independence OT Goal Formulation: With patient Time For Goal Achievement: 07/21/14 ADL Goals Pt Will Perform Lower Body Dressing: with supervision;sit to/from stand Pt Will Transfer to Toilet: with supervision;ambulating;regular height toilet Pt Will Perform Toileting - Clothing Manipulation and hygiene: with supervision;sit to/from stand  OT Frequency: Min 2X/week   Barriers to D/C:               End of Session Equipment Utilized During Treatment: Rolling walker CPM Right Knee CPM Right Knee: Off Nurse Communication: Mobility status  Activity Tolerance: Patient tolerated treatment well Patient left: in chair;with call bell/phone within reach   Time: 1045-1100 OT Time Calculation (min): 15 min Charges:  OT General Charges $OT Visit: 1 Procedure OT Evaluation $Initial OT Evaluation Tier I: 1 Procedure G-Codes:    Einar CrowEDDING, Sherri Levenhagen D 07/07/2014, 11:09 AM

## 2014-07-08 ENCOUNTER — Encounter (HOSPITAL_COMMUNITY): Payer: Self-pay | Admitting: General Practice

## 2014-07-08 LAB — BASIC METABOLIC PANEL
Anion gap: 12 (ref 5–15)
BUN: 9 mg/dL (ref 6–20)
CO2: 22 mmol/L (ref 22–32)
Calcium: 8 mg/dL — ABNORMAL LOW (ref 8.9–10.3)
Chloride: 102 mmol/L (ref 101–111)
Creatinine, Ser: 0.89 mg/dL (ref 0.44–1.00)
GFR calc Af Amer: >60 mL/min (ref >60)
Glucose, Bld: 94 mg/dL (ref 65–99)
POTASSIUM: 3.9 mmol/L (ref 3.5–5.1)
SODIUM: 136 mmol/L (ref 135–145)

## 2014-07-08 LAB — CBC
HCT: 26.3 % — ABNORMAL LOW (ref 36.0–46.0)
HEMOGLOBIN: 8.4 g/dL — AB (ref 12.0–15.0)
MCH: 25.7 pg — ABNORMAL LOW (ref 26.0–34.0)
MCHC: 31.9 g/dL (ref 30.0–36.0)
MCV: 80.4 fL (ref 78.0–100.0)
PLATELETS: 243 10*3/uL (ref 150–400)
RBC: 3.27 MIL/uL — ABNORMAL LOW (ref 3.87–5.11)
RDW: 14.6 % (ref 11.5–15.5)
WBC: 12.3 10*3/uL — AB (ref 4.0–10.5)

## 2014-07-08 LAB — GLUCOSE, CAPILLARY
Glucose-Capillary: 104 mg/dL — ABNORMAL HIGH (ref 65–99)
Glucose-Capillary: 118 mg/dL — ABNORMAL HIGH (ref 65–99)
Glucose-Capillary: 101 mg/dL — ABNORMAL HIGH (ref 65–99)
Glucose-Capillary: 103 mg/dL — ABNORMAL HIGH (ref 65–99)

## 2014-07-08 MED ORDER — ALUM & MAG HYDROXIDE-SIMETH 200-200-20 MG/5ML PO SUSP: 30.0000 mL | Freq: Four times a day (QID) | ORAL | Status: DC | PRN

## 2014-07-08 NOTE — Progress Notes (Signed)
Patient c/o of being nauseous and vomiting. Patient was given zofran to help with the N & V. Patient negative for flatus but had positive bowel sounds. Patient's abdomen was tight right below the sternum but was soft and non tender below the above mention area. Called on-call PA for order for ducalox suppository, given at 2200. Patient did have some positive flatus after the suppository was given. At this time, patient unable to have BM.   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 Patient is concerned that this episode of unable to have a BM could lead into another obstruction. Will continue to monitor the patient for any changes that may occur. Will have MD follow up with this issue.

## 2014-07-08 NOTE — Progress Notes (Signed)
Occupational Therapy Treatment Patient Details Name: Kim [name] MRN: [id number] DOB: 07-Apr-1940 Today's Date: 07/08/2014    History of present illness Patient is a 74 y/o female s/p R TKA. PMH of HTN, HLD, DM   OT comments  Progressing well, states dtr. Available and will be with her to assist with LB ADLS and bed mobility as needed at home.  States d/c this afternoon after final PT session.    Follow Up Recommendations  No OT follow up;Supervision - Intermittent    Equipment Recommendations  None recommended by OT    Recommendations for Other Services      Precautions / Restrictions Precautions Precautions: Fall;Knee Precaution Booklet Issued: Yes (comment) Precaution Comments: Reviewed precautions and HEP. Required Braces or Orthoses: Knee Immobilizer - Right Restrictions Weight Bearing Restrictions: Yes RLE Weight Bearing: Weight bearing as tolerated       Mobility Bed Mobility Overal bed mobility: Needs Assistance Bed Mobility: Sit to Supine     Supine to sit: Min guard;HOB elevated Sit to supine: Min assist   General bed mobility comments: hob flat no rail for return to bed, min a to bring r le into bed, states dtr. also available to do that for her if needed  Transfers Overall transfer level: Needs assistance Equipment used: Rolling walker (2 wheeled) Transfers: Sit to/from Stand Sit to Stand: Min guard         General transfer comment: Min guard to rise from EOB. Good demo of hand placement. Transferred to chair.    Balance Overall balance assessment: Needs assistance Sitting-balance support: Feet supported;No upper extremity supported Sitting balance-Leahy Scale: Good     Standing balance support: During functional activity Standing balance-Leahy Scale: Poor                     ADL Overall ADL's : Needs assistance/impaired     Grooming: Set up;Standing         Lower Body Bathing Details (indicate cue type and reason): states  dtr. is taking time off of work and will be with her to assist with LB ADLS       Lower Body Dressing Details (indicate cue type and reason): states dtr. is taking time off of work and will be with her to assist with LB ADLS Toilet Transfer: Minimal assistance;Comfort height toilet;Ambulation;RW   Toileting- Clothing Manipulation and Hygiene: Set up;Sitting/lateral lean       Functional mobility during ADLs: Minimal assistance;Rolling walker        Vision                     Perception     Praxis      Cognition   Behavior During Therapy: WFL for tasks assessed/performed Overall Cognitive Status: Within Functional Limits for tasks assessed                       Extremity/Trunk Assessment               Exercises Total Joint Exercises Ankle Circles/Pumps: Both;20 reps;Supine Quad Sets: Right;10 reps;Seated Heel Slides: Right;AAROM;5 reps;Seated Goniometric ROM: -10-60 degrees knee AROM.   Shoulder Instructions       General Comments      Pertinent Vitals/ Pain       Pain Assessment:  (did not rate but would moan out loud with any knee flexion) Pain Score: 9  Pain Location: right knee Pain Descriptors / Indicators: Moaning Pain Intervention(s): Limited activity  within patient's tolerance;Monitored during session;Repositioned  Home Living                                          Prior Functioning/Environment              Frequency       Progress Toward Goals  OT Goals(current goals can now be found in the care plan section)  Progress towards OT goals: Progressing toward goals     Plan Discharge plan remains appropriate    Co-evaluation                 End of Session Equipment Utilized During Treatment: Rolling walker CPM Right Knee CPM Right Knee: Off   Activity Tolerance Patient tolerated treatment well   Patient Left in bed;with call bell/phone within reach   Nurse Communication           Time: 1000-1017 OT Time Calculation (min): 17 min  Charges: OT General Charges $OT Visit: 1 Procedure OT Treatments $Self Care/Home Management : 8-22 mins  Robet LeuMorris, Alysia Scism Lorraine, COTA/L 07/08/2014, 10:23 AM

## 2014-07-08 NOTE — Progress Notes (Signed)
Physical Therapy Treatment Patient Details Name: Kim [name] L Ewell MRN: [id number] DOB: [birth year] Today's Date: 07/08/2014    History of Present Illness Patient is a 74 y/o female s/p R TKA. PMH of HTN, HLD, DM    PT Comments    Patient progressing well with mobility. Pain improved from AM session allowing increased tolerance for mobility. Tolerated stair negotiation with min A for balance/safety. Pt safe to discharge home with 24/7 S. Reviewed HEP. Will continue to follow if pt stll in hospital tomorrow to maximize functional independence.   Follow Up Recommendations  Home health PT;Supervision/Assistance - 24 hour     Equipment Recommendations  None recommended by PT    Recommendations for Other Services       Precautions / Restrictions Precautions Precautions: Fall;Knee Precaution Booklet Issued: Yes (comment) Precaution Comments: Reviewed precautions and HEP. Required Braces or Orthoses: Knee Immobilizer - Right Restrictions Weight Bearing Restrictions: Yes RLE Weight Bearing: Weight bearing as tolerated    Mobility  Bed Mobility Overal bed mobility: Needs Assistance Bed Mobility: Supine to Sit     Supine to sit: Supervision;HOB elevated Sit to supine: Min assist   General bed mobility comments: Supervision for safety. Use of rail for support.   Transfers Overall transfer level: Needs assistance Equipment used: Rolling walker (2 wheeled) Transfers: Sit to/from Stand Sit to Stand: Min guard         General transfer comment: Min guard to rise from EOB[facility] from chair. Good demo of hand placement.   Ambulation/Gait Ambulation/Gait assistance: Min guard Ambulation Distance (Feet): 75 Feet Assistive device: Rolling walker (2 wheeled) Gait Pattern/deviations: Step-to pattern;Decreased stride length;Decreased stance time - right;Decreased step length - left   Gait velocity interpretation: Below normal speed for age/gender General Gait Details: Pt  with very slow, guarded gait. increased pain with WB through RLE.   Stairs Stairs: Yes Stairs assistance: Min assist Stair Management: One rail Left;Step to pattern Number of Stairs: 4 General stair comments: Cues for technique. Min A for balance. Wearing KI to prevent knee buckling during SLS to ascend steps.  Wheelchair Mobility    Modified Rankin (Stroke Patients Only)       Balance Overall balance assessment: Needs assistance Sitting-balance support: Feet supported;No upper extremity supported Sitting balance-Leahy Scale: Good     Standing balance support: During functional activity Standing balance-Leahy Scale: Fair Standing balance comment: Able to wash hands at sink without LOB or UE support for short period.                    Cognition Arousal/Alertness: Awake/alert Behavior During Therapy: WFL for tasks assessed/performed Overall Cognitive Status: Within Functional Limits for tasks assessed                      Exercises Total Joint Exercises Ankle Circles/Pumps: Both;15 reps;Supine Quad Sets: Both;10 reps;Supine    General Comments        Pertinent Vitals/Pain Pain Assessment: Faces Faces Pain Scale: Hurts little more Pain Location: right knee with movement. Pain Descriptors / Indicators: Sore Pain Intervention(s): Limited activity within patient's tolerance;Monitored during session;Premedicated before session;Repositioned    Home Living Family/patient expects to be discharged to:: Private residence Living Arrangements: Spouse/significant other                  Prior Function            PT Goals (current goals can now be found in the care plan  section) Progress towards PT goals: Progressing toward goals    Frequency  BID    PT Plan Current plan remains appropriate    Co-evaluation             End of Session Equipment Utilized During Treatment: Gait belt Activity Tolerance: Patient tolerated treatment  well Patient left: in chair;with call bell/phone within reach;with family/visitor present     Time: 0272-53661308-1331 PT Time Calculation (min) (ACUTE ONLY): 23 min  Charges:  $Gait Training: 8-22 mins $Therapeutic Activity: 8-22 mins                    G Codes:      Arshia Spellman A Habiba Treloar 07/08/2014, 2:01 PM  Mylo RedShauna Laticia Vannostrand, PT, DPT [phone]

## 2014-07-08 NOTE — Progress Notes (Signed)
Subjective: 2 Days Post-Op Procedure(s) (LRB): RIGHT TOTAL KNEE ARTHROPLASTY (Right) Patient reports pain as moderate.  Patient c/o n/v and constipation last night.  Doing better this am after dulcolax suppository.  No lightheadedness/dizziness, chest pain/sob.  Positive flatus but no bm.  Tolerating diet.  Objective: Vital signs in last 24 hours: Temp:  [97.8 F (36.6 C)-99.5 F (37.5 C)] 99.5 F (37.5 C) (05/13 0456) Pulse Rate:  [103-115] 115 (05/13 0456) Resp:  [16-18] 18 (05/13 0456) BP: (119-140)/(41-70) 129/41 mmHg (05/13 0456) SpO2:  [93 %-96 %] 93 % (05/13 0456)  Intake/Output from previous day: 05/12 0701 - 05/13 0700 In: 720 [P.O.:720] Out: -  Intake/Output this shift:     Recent Labs  07/07/14 0524 07/08/14 0333  HGB 9.5* 8.4*    Recent Labs  07/07/14 0524 07/08/14 0333  WBC 12.9* 12.3*  RBC 3.67* 3.27*  HCT 30.2* 26.3*  PLT 258 243    Recent Labs  07/07/14 0524 07/08/14 0333  NA 141 136  K 4.6 3.9  CL 107 102  CO2 26 22  BUN 10 9  CREATININE 0.92 0.89  GLUCOSE 99 94  CALCIUM 8.4* 8.0*   No results for input(s): LABPT, INR in the last 72 hours.  Neurologically intact Neurovascular intact Sensation intact distally Intact pulses distally Dorsiflexion/Plantar flexion intact Incision: dressing C/D/I No cellulitis present Compartment soft  Dressing changed by me today Negative homans bilaterally  Assessment/Plan: 2 Days Post-Op Procedure(s) (LRB): RIGHT TOTAL KNEE ARTHROPLASTY (Right) Advance diet Up with PT-WBAT RLE ABLA-mild but stable D/C home with hhpt this afternoon Dry dressing change prn Please place ted hose BLE prior to dc  CSX CorporationM Lindsey Stanbery 07/08/2014, 7:49 AM

## 2014-07-08 NOTE — Progress Notes (Signed)
Physical Therapy Treatment Patient Details Name: Kim [name] MRN: [id number] DOB: [birth year] Today's Date: 07/08/2014    History of Present Illness Patient is a 74 y/o female s/p R TKA. PMH of HTN, HLD, DM    PT Comments    Patient with increased pain in right knee today 9/10 limiting mobility. Pt had a rough night last night and not able to sleep due to pain. Given pain meds prior to session. Performed HEP, transferred to chair and tolerated limited ambulation. Will need to perform stair negotiation in PM session prior to d/c home if pt can tolerate. Will follow up.   Follow Up Recommendations  Home health PT;Supervision/Assistance - 24 hour     Equipment Recommendations  None recommended by PT    Recommendations for Other Services       Precautions / Restrictions Precautions Precautions: Fall;Knee Precaution Booklet Issued: Yes (comment) Precaution Comments: Reviewed precautions and HEP. Required Braces or Orthoses: Knee Immobilizer - Right Restrictions Weight Bearing Restrictions: Yes RLE Weight Bearing: Weight bearing as tolerated    Mobility  Bed Mobility Overal bed mobility: Needs Assistance Bed Mobility: Supine to Sit     Supine to sit: Min guard;HOB elevated     General bed mobility comments: Difficulty moving RLE to EOB due to increased pain. No physical assist required. Use of bed rail for support.   Transfers Overall transfer level: Needs assistance Equipment used: Rolling walker (2 wheeled) Transfers: Sit to/from Stand Sit to Stand: Min guard         General transfer comment: Min guard to rise from EOB. Good demo of hand placement. Transferred to chair.  Ambulation/Gait Ambulation/Gait assistance: Min guard Ambulation Distance (Feet): 10 Feet Assistive device: Rolling walker (2 wheeled) Gait Pattern/deviations: Step-to pattern;Decreased stance time - right;Decreased step length - left;Trunk flexed;Antalgic   Gait velocity interpretation:  Below normal speed for age/gender General Gait Details: Pt with very slow, guarded gait. increased pain with WB.    Stairs            Wheelchair Mobility    Modified Rankin (Stroke Patients Only)       Balance Overall balance assessment: Needs assistance Sitting-balance support: Feet supported;No upper extremity supported Sitting balance-Leahy Scale: Good     Standing balance support: During functional activity Standing balance-Leahy Scale: Poor                      Cognition Arousal/Alertness: Awake/alert Behavior During Therapy: WFL for tasks assessed/performed Overall Cognitive Status: Within Functional Limits for tasks assessed                      Exercises Total Joint Exercises Ankle Circles/Pumps: Both;20 reps;Supine Quad Sets: Right;10 reps;Seated Heel Slides: Right;AAROM;5 reps;Seated Goniometric ROM: -10-60 degrees knee AROM.    General Comments        Pertinent Vitals/Pain Pain Assessment: 0-10 Pain Score: 9  Pain Location: right knee  Pain Descriptors / Indicators: Sore;Sharp;Aching;Grimacing;Guarding Pain Intervention(s): Limited activity within patient's tolerance;Monitored during session;Repositioned    Home Living                      Prior Function            PT Goals (current goals can now be found in the care plan section) Progress towards PT goals: Not progressing toward goals - comment (Limited by pain.)    Frequency  BID    PT Plan Current plan remains appropriate  Co-evaluation             End of Session Equipment Utilized During Treatment: Gait belt Activity Tolerance: Patient limited by pain Patient left: in chair;with call bell/phone within reach     Time: 0909-0926 PT Time Calculation (min) (ACUTE ONLY): 17 min  Charges:  $Therapeutic Activity: 8-22 mins                    G Codes:      Roald Lukacs A Allyssia Skluzacek 07/08/2014, 10:02 AM  Mylo RedShauna Tanza Pellot, PT, DPT [phone]

## 2014-07-09 LAB — GLUCOSE, CAPILLARY: Glucose-Capillary: 93 mg/dL (ref 65–99)

## 2014-07-09 NOTE — Progress Notes (Signed)
Kim [name] discharged home per MD order. Discharge instructions reviewed and discussed with patient. All questions and concerns answered. Copy of instructions and scripts given to patient. IV removed.  Patient escorted to car by staff in a wheelchair. No distress noted upon discharge.   Dennard NipScott, Braylynn Ghan R 07/09/2014 8:03 AM

## 2014-10-03 ENCOUNTER — Emergency Department (HOSPITAL_COMMUNITY): Payer: Medicare Other

## 2014-10-03 ENCOUNTER — Emergency Department (HOSPITAL_COMMUNITY)
Admission: EM | Admit: 2014-10-03 | Discharge: 2014-10-04 | Disposition: A | Discharge status: Home / Self Care | Payer: Medicare Other | Attending: Emergency Medicine | Admitting: Emergency Medicine

## 2014-10-03 ENCOUNTER — Encounter (HOSPITAL_COMMUNITY): Payer: Self-pay | Admitting: Emergency Medicine

## 2014-10-03 DIAGNOSIS — Z79899 Other long term (current) drug therapy: Secondary | ICD-10-CM | POA: Insufficient documentation

## 2014-10-03 DIAGNOSIS — Z87891 Personal history of nicotine dependence: Secondary | ICD-10-CM | POA: Insufficient documentation

## 2014-10-03 DIAGNOSIS — I10 Essential (primary) hypertension: Secondary | ICD-10-CM | POA: Diagnosis not present

## 2014-10-03 DIAGNOSIS — K219 Gastro-esophageal reflux disease without esophagitis: Secondary | ICD-10-CM | POA: Diagnosis not present

## 2014-10-03 DIAGNOSIS — Z7982 Long term (current) use of aspirin: Secondary | ICD-10-CM | POA: Diagnosis not present

## 2014-10-03 DIAGNOSIS — R634 Abnormal weight loss: Secondary | ICD-10-CM | POA: Diagnosis not present

## 2014-10-03 DIAGNOSIS — E785 Hyperlipidemia, unspecified: Secondary | ICD-10-CM | POA: Diagnosis not present

## 2014-10-03 DIAGNOSIS — E119 Type 2 diabetes mellitus without complications: Secondary | ICD-10-CM | POA: Insufficient documentation

## 2014-10-03 DIAGNOSIS — Z880 Allergy status to penicillin: Secondary | ICD-10-CM | POA: Diagnosis not present

## 2014-10-03 LAB — COMPREHENSIVE METABOLIC PANEL
ALBUMIN: 3.9 g/dL (ref 3.5–5.0)
ALK PHOS: 57 U/L (ref 38–126)
ALT: 9 U/L — ABNORMAL LOW (ref 14–54)
ANION GAP: 12 (ref 5–15)
AST: 18 U/L (ref 15–41)
BILIRUBIN TOTAL: 0.3 mg/dL (ref 0.3–1.2)
BUN: 19 mg/dL (ref 6–20)
CHLORIDE: 97 mmol/L — AB (ref 101–111)
CO2: 27 mmol/L (ref 22–32)
Calcium: 9.7 mg/dL (ref 8.9–10.3)
Creatinine, Ser: 0.96 mg/dL (ref 0.44–1.00)
GFR, EST NON AFRICAN AMERICAN: 58 mL/min — AB (ref >60)
Glucose, Bld: 121 mg/dL — ABNORMAL HIGH (ref 65–99)
Potassium: 4 mmol/L (ref 3.5–5.1)
Sodium: 136 mmol/L (ref 135–145)
TOTAL PROTEIN: 8 g/dL (ref 6.5–8.1)

## 2014-10-03 LAB — CBC
HCT: 38.8 % (ref 36.0–46.0)
Hemoglobin: 12.3 g/dL (ref 12.0–15.0)
MCH: 25.1 pg — ABNORMAL LOW (ref 26.0–34.0)
MCHC: 31.7 g/dL (ref 30.0–36.0)
MCV: 79 fL (ref 78.0–100.0)
Platelets: 346 10*3/uL (ref 150–400)
RBC: 4.91 MIL/uL (ref 3.87–5.11)
RDW: 14.4 % (ref 11.5–15.5)
WBC: 9.6 10*3/uL (ref 4.0–10.5)

## 2014-10-03 MED ORDER — IOHEXOL 300 MG/ML  SOLN
25.0000 mL | Freq: Once | INTRAMUSCULAR | Status: AC | PRN
  Administered 2014-10-03: 25 mL via ORAL

## 2014-10-03 MED ORDER — SODIUM CHLORIDE 0.9 % IV BOLUS (SEPSIS)
500.0000 mL | Freq: Once | INTRAVENOUS | Status: AC
  Administered 2014-10-03: 500 mL via INTRAVENOUS

## 2014-10-03 MED ORDER — SODIUM CHLORIDE 0.9 % IV SOLN
INTRAVENOUS | Status: DC
  Administered 2014-10-03: 22:00:00 via INTRAVENOUS

## 2014-10-03 MED ORDER — IOHEXOL 300 MG/ML  SOLN
100.0000 mL | Freq: Once | INTRAMUSCULAR | Status: AC | PRN
  Administered 2014-10-03: 100 mL via INTRAVENOUS

## 2014-10-03 NOTE — ED Notes (Signed)
Pt sts weight loss and nausea since having knee replacement in May; pt sts HA today

## 2014-10-03 NOTE — ED Notes (Signed)
Pt is eating and drinking without difficulty.

## 2014-10-03 NOTE — ED Provider Notes (Signed)
CSN: [id number]     Arrival date & time 10/03/14  1622 History   First MD Initiated Contact with Patient 10/03/14 2150     Chief Complaint  Patient presents with  . Weight Loss     (Consider location/radiation/quality/duration/timing/severity/associated sxs/prior Treatment) The history is provided by the patient.    Kim [name] is a 74 y.o. female who presents for evaluation of weakness, malaise, anorexia, and spitting after eating. Symptoms have been present for 3 months, since her knee replacement surgery. She is now able to walk with only a cane. She had significant pain postoperatively and had to take narcotics until 3 weeks ago. She denies change in stooling habits. She denies fever. She denies dysuria or urinary frequency. Saw her doctor one week ago and was advised to use ensure, but she feels like it has not helped. She feels that she has lost 15 pounds. She is taking her usual medications. There are no other known modifying factors.    Past Medical History  Diagnosis Date  . Diabetes mellitus without complication   . Hypertension   . Seasonal allergies   . Hyperlipemia   . Constipation due to pain medication   . GERD (gastroesophageal reflux disease)   . Postinflammatory pulmonary fibrosis    Past Surgical History  Procedure Laterality Date  . Abdominal hysterectomy    . Small intestine surgery    . Hernia repair      X 2  . Colonoscopy    . Total knee arthroplasty Right 07/06/2014    Procedure: RIGHT TOTAL KNEE ARTHROPLASTY;  Surgeon: Mckinley Jewel, MD;  Location: [facility] OR;  Service: Orthopedics;  Laterality: Right;   Family History  Problem Relation Age of Onset  . Stomach cancer Sister   . Heart attack Sister    Social History  Substance Use Topics  . Smoking status: Former Smoker -- 0.50 packs/day for 10 years    Types: Cigarettes    Quit date: 02/26/1988  . Smokeless tobacco: Never Used  . Alcohol Use: No   OB History    No data available     Review  of Systems  All other systems reviewed and are negative.     Allergies  Codeine and Penicillins  Home Medications   Prior to Admission medications   Medication Sig Start Date End Date Taking? Authorizing Provider  aspirin EC 81 MG tablet Take 81 mg by mouth daily.   Yes Historical Provider, MD  bisacodyl (DULCOLAX) 5 MG EC tablet Take 1 tablet (5 mg total) by mouth daily as needed for moderate constipation. 07/06/14  Yes Cristie Hem, PA-C  budesonide-formoterol (SYMBICORT) 160-4.5 MCG/ACT inhaler Inhale 2 puffs into the lungs 2 (two) times daily.   Yes Historical Provider, MD  Cholecalciferol (VITAMIN D) 2000 UNITS CAPS Take 1 capsule by mouth daily.   Yes Historical Provider, MD  dextromethorphan-guaiFENesin (ROBITUSSIN-DM) 10-100 MG/5ML liquid Take 2.5 mLs by mouth every 4 (four) hours as needed for cough.   Yes Historical Provider, MD  famotidine (PEPCID) 20 MG tablet TAKE 1 TABLET BY MOUTH AT BEDTIME 06/27/14  Yes Nyoka Cowden, MD  fexofenadine-pseudoephedrine (ALLEGRA-D 24) 180-240 MG per 24 hr tablet Take 1 tablet by mouth daily.   Yes Historical Provider, MD  losartan-hydrochlorothiazide (HYZAAR) 100-12.5 MG per tablet Take 1 tablet by mouth daily.   Yes Historical Provider, MD  metFORMIN (GLUCOPHAGE) 500 MG tablet TAKE 1 TABLET BY MOUTH TWICE A DAY 03/31/13  Yes Eulis Foster, FNP  methocarbamol (ROBAXIN) 500 MG tablet Take 1 tablet (500 mg total) by mouth 4 (four) times daily. 07/06/14  Yes Cristie Hem, PA-C  ondansetron (ZOFRAN) 4 MG tablet Take 1 tablet (4 mg total) by mouth every 8 (eight) hours as needed for nausea or vomiting. 07/06/14  Yes Cristie Hem, PA-C  pantoprazole (PROTONIX) 40 MG tablet TAKE 1 TABLET BY MOUTH EVERY DAY TAKE 30-60 MINS. BEFORE FIRST MEAL OF THE DAY 02/21/14  Yes Nyoka Cowden, MD  simvastatin (ZOCOR) 40 MG tablet Take 40 mg by mouth daily. 05/02/14  Yes Historical Provider, MD   BP 121/55 mmHg  Pulse 87  Temp(Src) 97.9 F (36.6 C) (Oral)   Resp 27  Wt 142 lb (64.411 kg)  SpO2 98% Physical Exam  Constitutional: She is oriented to person, place, and time. She appears well-developed.  Elderly, frail  HENT:  Head: Normocephalic and atraumatic.  Right Ear: External ear normal.  Left Ear: External ear normal.  Eyes: Conjunctivae and EOM are normal. Pupils are equal, round, and reactive to light.  Neck: Normal range of motion and phonation normal. Neck supple.  Cardiovascular: Normal rate, regular rhythm and normal heart sounds.   Pulmonary/Chest: Effort normal and breath sounds normal. She exhibits no bony tenderness.  Abdominal: Soft. There is no tenderness.  Musculoskeletal: Normal range of motion.  Neurological: She is alert and oriented to person, place, and time. No cranial nerve deficit or sensory deficit. She exhibits normal muscle tone. Coordination normal.  Skin: Skin is warm, dry and intact.  Psychiatric: She has a normal mood and affect. Her behavior is normal. Judgment and thought content normal.  Nursing note and vitals reviewed.   ED Course  Procedures (including critical care time)  07/11/2014- documented weight 71.7 kg  Labs Review Labs Reviewed  COMPREHENSIVE METABOLIC PANEL - Abnormal; Notable for the following:    Chloride 97 (*)    Glucose, Bld 121 (*)    ALT 9 (*)    GFR calc non Af Amer 58 (*)    All other components within normal limits  CBC - Abnormal; Notable for the following:    MCH 25.1 (*)    All other components within normal limits  URINALYSIS, ROUTINE W REFLEX MICROSCOPIC (NOT AT [facility]) - Abnormal; Notable for the following:    Specific Gravity, Urine 1.045 (*)    Leukocytes, UA LARGE (*)    All other components within normal limits  URINE MICROSCOPIC-ADD ON - Abnormal; Notable for the following:    Bacteria, UA FEW (*)    All other components within normal limits    Imaging Review Ct Abdomen Pelvis W Contrast  10/04/2014   CLINICAL DATA:  Acute onset of vomiting and anorexia.  Initial encounter.  EXAM: CT ABDOMEN AND PELVIS WITH CONTRAST  TECHNIQUE: Multidetector CT imaging of the abdomen and pelvis was performed using the standard protocol following bolus administration of intravenous contrast.  CONTRAST:  OMNIPAQUE IOHEXOL 300 MG/ML  SOLN  COMPARISON:  CT of the abdomen and pelvis from 12/01/2009  FINDINGS: Diffuse honeycombing and fibrotic change are noted at the lung bases.  The liver and spleen are unremarkable in appearance. The gallbladder is within normal limits. The pancreas and adrenal glands are unremarkable.  Several renal cysts are noted bilaterally, measuring up to 1.9 cm in size. The kidneys are otherwise unremarkable. There is no evidence of hydronephrosis. No renal or ureteral stones are seen.  No free fluid is identified. The small bowel is unremarkable  in appearance. The stomach is within normal limits. No acute vascular abnormalities are seen. Diffuse calcification is noted along the abdominal aorta and its branches.  The appendix is not well seen; there is no evidence of appendicitis. Vague fatty infiltration within the wall of the colon may reflect sequelae of chronic inflammation. The colon is otherwise unremarkable.  The bladder is mildly distended and grossly unremarkable. The patient is status post hysterectomy. Postoperative change is noted about the pelvis and the aortic bifurcation. No inguinal lymphadenopathy is seen.  A peripherally calcified focus of fat at the left lower quadrant anterior abdominal wall may be posttraumatic or postoperative in nature.  No acute osseous abnormalities are identified.  IMPRESSION: 1. No acute abnormality seen to explain the patient's symptoms. 2. Vague fatty infiltration within the wall of the colon may reflect sequelae of chronic inflammation. 3. Diffuse calcification along the abdominal aorta and its branches. 4. Scattered renal cysts noted. 5. Diffuse honeycombing and fibrotic change at the lung bases.    Electronically Signed   By: Roanna Raider M.D.   On: 10/04/2014 00:51     EKG Interpretation None      MDM   Final diagnoses:  Weight loss    Nursing Notes Reviewed/ Care Coordinated Applicable Imaging Reviewed Interpretation of Laboratory Data incorporated into ED treatment  Care to Dr. Gwendolyn Grant to disposition after return of CT imaging    Mancel Bale, MD 10/05/14 1254

## 2014-10-03 NOTE — ED Notes (Signed)
Pt states that she called her doctors office today and that she was advised to come see her doctor today but the pt declined "because it wasn't going to do me no good". Pt states that her doctor started her on ensure last week and that she has been drinking 2 per day. Pt states that she was able to drink her ensures today but also states that she cannot eat and that "I feel tired and just don't want to do anything."

## 2014-10-04 LAB — URINALYSIS, ROUTINE W REFLEX MICROSCOPIC
Bilirubin Urine: NEGATIVE
Glucose, UA: NEGATIVE mg/dL
Hgb urine dipstick: NEGATIVE
Ketones, ur: NEGATIVE mg/dL
NITRITE: NEGATIVE
Protein, ur: NEGATIVE mg/dL
Specific Gravity, Urine: 1.045 — ABNORMAL HIGH (ref 1.005–1.030)
Urobilinogen, UA: 0.2 mg/dL (ref 0.0–1.0)
pH: 5.5 (ref 5.0–8.0)

## 2014-10-04 LAB — URINE MICROSCOPIC-ADD ON

## 2014-10-04 NOTE — Discharge Instructions (Signed)
Malnutrition Many of us think of malnutrition as a condition in which there is not enough to eat. Malnutrition is actually any condition where nutrition is poor. This means:  Too much to eat as we see in conditions of obesity.  Too little to eat with starvation. The following information is only for the malnourished with dietary deficiencies (poor diet). CAUSES  Under-nutrition can result from:  Poor intake.  Malabsorption  Lactation  Bleeding  Diarrhea  Old age.  Kidney failure.  Infancy  Poverty  Infection  Adolescence  Excessive sweating  Drug addiction  Pregnancy  Early childhood. Under-nutrition comes anytime the demand is more than the intake. SYMPTOMS  The problems depend on what type of malnutrition is present. Some general symptoms include:  Fatigue.  Dizziness.  Fainting  Weight loss.  Poor immune response.  Lack of menstruation.  Lack of growth in children.  Hair loss. DIAGNOSIS  Your caregiver will usually suspect malnutrition based on results of your:   Medical and dietary history.  Physical exam. This will often include measurements of your BMI (body mass index).  Perhaps some blood tests. These may include: plasma levels of nutrients and nutrient-dependent substances, such as:  Hemoglobin  Thyroid hormones  Transferrin  Albumin RISK FACTORS Persons in the following circumstances may be at risk of malnutrition.  Infants and children are at risk of under-nutrition. This is because of their high demand for energy and essential nutrients. Protein-energy malnutrition in children consuming inadequate amounts of protein, calories, and other nutrients is a particularly severe form of under-nutrition that delays growth and development. This includes [place]asmus and [place].  Hemorrhagic disease of the newborn is a life-threatening disorder. This is due to a lack of vitamin K, iron, folic acid, vitamin C, copper, zinc, and vitamin  A. This may occur in inadequately fed infants and children.  In adolescence, nutritional requirements increase because they are growing. Anorexia nervosa, a form of starvation, may affect adolescents.  Pregnancy and lactation. Requirements for all nutrients are increased during pregnancy and lactation.  Abnormal diets, such as pica (the consumption of nonnutritive substances, such as clay and charcoal), are common in pregnancy.  Anemia due to folic acid deficiency is common in pregnant women. This is especially true for those who have taken oral contraceptives. Folic acid supplements are now recommended for pregnant women. Folic acid prevents neural tube defects (spina bifida) in children.  Breast-fed-only infants may develop vitamin B12 deficiency if the mother is a vegan.  An alcoholic mother may have a handicapped and stunted child with fetal alcohol syndrome. This is due to the effects of alcohol on the fetus. Do not drink during pregnancy.  Old age: A weakened sense of taste and smell, loneliness, physical and mental handicaps, immobility, and chronic illness can hurt the food intake in the elderly. Absorption is reduced. This may add to iron deficiency, calcium and bone problems and also a softening of the bones due to lack of vitamin D. This is also made worse by not being in the sun.  With aging, we loose lean body mass. These changes and a reduction in physical activity result in lower energy and protein requirements compared with those of younger adults.  Chronic disease including malabsorption states (including those resulting from surgery) tend to impair the absorption of fat-soluble vitamins, vitamin B12, calcium, and iron.  Liver disease impairs the storage of vitamins A and B12. It also interferes with the metabolism of protein and energy sources.  Kidney disease may   cause deficiencies of protein, iron, and vitamin D.  Cancer and AIDS may cause anorexia. This is a loss of  appetite.  Vegetarian diet. The most common form of this type of diet is when meat and fish are not eaten, but eggs and dairy products are eaten. Iron deficiency is the only risk. Ovo-lacto vegetarians tend to live longer and to develop fewer chronic disabling conditions than their meat-eating peers. However, their lifestyle usually includes regular exercise and abstention from alcohol and tobacco. This may contribute to better health. Vegans consume no animal products and are susceptible to vitamin B12 deficiency. Yeast extracts and oriental-style fermented foods provide this vitamin. Intake of calcium, iron, and zinc also tends to be low. A fruitarian diet (eat only fruit) is deficient in protein, salt, and many micronutrients. This is not recommended.  Fad diets: Many commercial diets are claimed to enhance well-being or reduce weight. A physician should be alert to early evidence of nutrient deficiency or toxicity in patients on these diets. Such diets have resulted in vitamin, mineral, and protein deficiency states and cardiac, renal, and metabolic disorders. Some fad diets have resulted in death. People on very low calorie diets (less than 400 kcal/day) cannot sustain health for long. Some trace mineral supplements have induced toxicity.  Alcohol or drug dependency: Addiction leads to a troubled lifestyle in which adequate nourishment is ignored. Absorption and metabolism of nutrients are impaired. High levels of alcohol are poisonous. Too much alcohol can cause tissue injury, particularly of the GI tract, liver, pancreas, brain, and peripheral nervous system. Beer drinkers who consume food may gain weight, but alcoholics who use more than one quart of hard liquor per day lose weight and become undernourished. Drug addicts are usually very skinny. Alcoholism is the most common cause of thiamine deficiency and may lead to deficiencies of magnesium, zinc, and other vitamins. TREATMENT  Get treatment if  you experience changes in how your body is working.  PREVENTION  Eating a good, well-balanced diet helps to prevent most forms of malnutrition. Document Released: 12/28/2004 Document Revised: 05/06/2011 Document Reviewed: 01/19/2007 [employer] Patient Information 2015 [employer]. This information is not intended to replace advice given to you by your health care provider. Make sure you discuss any questions you have with your health care provider.  

## 2014-10-04 NOTE — ED Provider Notes (Signed)
0030 - Care from Dr. Effie Shy. Awaiting CT abdomen for weight loss. 0100 - CT shows chronic inflammation of her colon, otherwise normal. Will have her f/u with her PCP. I have reviewed all labs and imaging and considered them in my medical decision making.  CT Abdomen Pelvis W Contrast (Final result) Result time: 10/04/14 00:51:42   Final result by Rad Results In Interface (10/04/14 00:51:42)   Narrative:   CLINICAL DATA: Acute onset of vomiting and anorexia. Initial encounter.  EXAM: CT ABDOMEN AND PELVIS WITH CONTRAST  TECHNIQUE: Multidetector CT imaging of the abdomen and pelvis was performed using the standard protocol following bolus administration of intravenous contrast.  CONTRAST: OMNIPAQUE IOHEXOL 300 MG/ML SOLN  COMPARISON: CT of the abdomen and pelvis from 12/01/2009  FINDINGS: Diffuse honeycombing and fibrotic change are noted at the lung bases.  The liver and spleen are unremarkable in appearance. The gallbladder is within normal limits. The pancreas and adrenal glands are unremarkable.  Several renal cysts are noted bilaterally, measuring up to 1.9 cm in size. The kidneys are otherwise unremarkable. There is no evidence of hydronephrosis. No renal or ureteral stones are seen.  No free fluid is identified. The small bowel is unremarkable in appearance. The stomach is within normal limits. No acute vascular abnormalities are seen. Diffuse calcification is noted along the abdominal aorta and its branches.  The appendix is not well seen; there is no evidence of appendicitis. Vague fatty infiltration within the wall of the colon may reflect sequelae of chronic inflammation. The colon is otherwise unremarkable.  The bladder is mildly distended and grossly unremarkable. The patient is status post hysterectomy. Postoperative change is noted about the pelvis and the aortic bifurcation. No inguinal lymphadenopathy is seen.  A peripherally calcified focus  of fat at the left lower quadrant anterior abdominal wall may be posttraumatic or postoperative in nature.  No acute osseous abnormalities are identified.  IMPRESSION: 1. No acute abnormality seen to explain the patient's symptoms. 2. Vague fatty infiltration within the wall of the colon may reflect sequelae of chronic inflammation. 3. Diffuse calcification along the abdominal aorta and its branches. 4. Scattered renal cysts noted. 5. Diffuse honeycombing and fibrotic change at the lung bases.   Electronically Signed By: Roanna Raider M.D. On: 10/04/2014 00:51        Elwin Mocha, MD 10/04/14 [phone]

## 2014-10-04 NOTE — ED Notes (Signed)
Pt calling daughter and getting dressed.

## 2014-10-23 ENCOUNTER — Other Ambulatory Visit: Payer: Self-pay | Admitting: Internal Medicine

## 2014-12-29 ENCOUNTER — Other Ambulatory Visit: Payer: Self-pay | Admitting: Internal Medicine

## 2015-04-27 ENCOUNTER — Other Ambulatory Visit (HOSPITAL_COMMUNITY): Payer: Self-pay | Admitting: Family Medicine

## 2015-04-27 DIAGNOSIS — R634 Abnormal weight loss: Secondary | ICD-10-CM

## 2015-05-04 ENCOUNTER — Encounter (HOSPITAL_COMMUNITY)
Admission: RE | Admit: 2015-05-04 | Discharge: 2015-05-04 | Disposition: A | Discharge status: Home / Self Care | Payer: Medicare Other | Source: Ambulatory Visit | Attending: Family Medicine | Admitting: Family Medicine

## 2015-05-04 DIAGNOSIS — R634 Abnormal weight loss: Secondary | ICD-10-CM | POA: Insufficient documentation

## 2015-05-04 MED ORDER — SODIUM IODIDE I 131 CAPSULE
10.1900 | Freq: Once | INTRAVENOUS | Status: AC | PRN
  Administered 2015-05-04: 10.19 via ORAL

## 2015-05-05 ENCOUNTER — Encounter (HOSPITAL_COMMUNITY)
Admission: RE | Admit: 2015-05-05 | Discharge: 2015-05-05 | Disposition: A | Discharge status: Home / Self Care | Payer: Medicare Other | Source: Ambulatory Visit | Attending: Family Medicine | Admitting: Family Medicine

## 2015-05-05 DIAGNOSIS — R634 Abnormal weight loss: Secondary | ICD-10-CM | POA: Diagnosis not present

## 2015-05-05 MED ORDER — SODIUM PERTECHNETATE TC 99M INJECTION: 10.1000 | Freq: Once | INTRAVENOUS | Status: DC | PRN

## 2015-08-23 ENCOUNTER — Other Ambulatory Visit: Payer: Self-pay | Admitting: Internal Medicine

## 2015-08-23 ENCOUNTER — Ambulatory Visit (HOSPITAL_COMMUNITY)
Admission: RE | Admit: 2015-08-23 | Discharge: 2015-08-23 | Disposition: A | Discharge status: Home / Self Care | Payer: Medicare Other | Source: Ambulatory Visit | Attending: Internal Medicine | Admitting: Internal Medicine

## 2015-08-23 DIAGNOSIS — R053 Chronic cough: Secondary | ICD-10-CM

## 2015-08-23 DIAGNOSIS — R05 Cough: Secondary | ICD-10-CM | POA: Insufficient documentation

## 2015-08-23 DIAGNOSIS — J849 Interstitial pulmonary disease, unspecified: Secondary | ICD-10-CM | POA: Insufficient documentation

## 2015-08-24 ENCOUNTER — Other Ambulatory Visit: Payer: Self-pay | Admitting: Internal Medicine

## 2015-08-24 DIAGNOSIS — R9389 Abnormal findings on diagnostic imaging of other specified body structures: Secondary | ICD-10-CM

## 2015-08-30 ENCOUNTER — Ambulatory Visit (HOSPITAL_COMMUNITY): Payer: Medicare Other

## 2015-09-05 ENCOUNTER — Ambulatory Visit (HOSPITAL_COMMUNITY)
Admission: RE | Admit: 2015-09-05 | Discharge: 2015-09-05 | Disposition: A | Discharge status: Home / Self Care | Payer: Medicare Other | Source: Ambulatory Visit | Attending: Internal Medicine | Admitting: Internal Medicine

## 2015-09-05 ENCOUNTER — Encounter (HOSPITAL_COMMUNITY): Payer: Self-pay

## 2015-09-05 DIAGNOSIS — R938 Abnormal findings on diagnostic imaging of other specified body structures: Secondary | ICD-10-CM | POA: Diagnosis not present

## 2015-09-05 DIAGNOSIS — R918 Other nonspecific abnormal finding of lung field: Secondary | ICD-10-CM | POA: Insufficient documentation

## 2015-09-05 DIAGNOSIS — I700 Atherosclerosis of aorta: Secondary | ICD-10-CM | POA: Insufficient documentation

## 2015-09-05 DIAGNOSIS — R9389 Abnormal findings on diagnostic imaging of other specified body structures: Secondary | ICD-10-CM

## 2015-09-05 DIAGNOSIS — R590 Localized enlarged lymph nodes: Secondary | ICD-10-CM | POA: Diagnosis not present

## 2015-09-05 DIAGNOSIS — I2510 Atherosclerotic heart disease of native coronary artery without angina pectoris: Secondary | ICD-10-CM | POA: Diagnosis not present

## 2015-09-05 MED ORDER — IOPAMIDOL (ISOVUE-300) INJECTION 61%
75.0000 mL | Freq: Once | INTRAVENOUS | Status: AC | PRN
Start: 2015-09-05 — End: 2015-09-05
  Administered 2015-09-05: 75 mL via INTRAVENOUS

## 2015-09-07 ENCOUNTER — Telehealth: Payer: Self-pay | Admitting: Internal Medicine

## 2015-09-08 NOTE — Telephone Encounter (Signed)
Message opened in error Will close this encounter 

## 2015-09-15 ENCOUNTER — Institutional Professional Consult (permissible substitution): Payer: Medicare Other | Admitting: Internal Medicine

## 2015-09-15 ENCOUNTER — Ambulatory Visit: Payer: Medicare Other | Admitting: Internal Medicine

## 2015-09-21 ENCOUNTER — Encounter: Payer: Self-pay | Admitting: Internal Medicine

## 2015-09-21 ENCOUNTER — Ambulatory Visit (INDEPENDENT_AMBULATORY_CARE_PROVIDER_SITE_OTHER): Payer: Medicare Other | Admitting: Internal Medicine

## 2015-09-21 VITALS — BP 122/70 | HR 114 | Ht 69.0 in | Wt 133.0 lb

## 2015-09-21 DIAGNOSIS — R05 Cough: Secondary | ICD-10-CM

## 2015-09-21 DIAGNOSIS — R058 Other specified cough: Secondary | ICD-10-CM

## 2015-09-21 DIAGNOSIS — J8410 Pulmonary fibrosis, unspecified: Secondary | ICD-10-CM

## 2015-09-21 MED ORDER — FAMOTIDINE 20 MG PO TABS: ORAL_TABLET | ORAL | 11 refills | Status: DC

## 2015-09-21 MED ORDER — PANTOPRAZOLE SODIUM 40 MG PO TBEC: 40.0000 mg | DELAYED_RELEASE_TABLET | Freq: Every day | ORAL | 2 refills | Status: DC

## 2015-09-21 NOTE — Assessment & Plan Note (Addendum)
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  Dec 2011 and also may have other beneficial effects as per the latest review in [place] vol 193 p1345 Jun 20016.  This may not always be cause and effect, but given how universally unimpressive and expensive  all the other  Drugs developed to day  have been for pf,   rec restart  rx ppi ina m/h2 pm diet/ lifestyle modification and f/u with serial walking sats  (she can't do pfts)  for now to put more points on the curve / establish firm baseline before considering additional measures.   Total time devoted to counseling  = 35/64m review case with pt/ discussion of options/alternatives/ personally creating written instructions  in presence of pt  then going over those specific  Instructions directly with the pt including how to use all of the meds but in particular covering each new medication in detail and the difference between the maintenance/automatic meds and the prns using an  action plan format for the latter.

## 2015-09-21 NOTE — Assessment & Plan Note (Signed)
Trial of gerd rx 10/22/2013 > improved 12/06/2013  - rechallenge with gerd rx 09/21/2015 >>>   Cough does not occur with exertion or correlate with sob but rather p supper and hs so much more suggestive of uacs than pf related coughing that also typically is reproducible with a deep breath  rec gerd Rx/ diet x 6 weeks min then regroup here.  Each maintenance medication was reviewed in detail including most importantly the difference between maintenance and as needed and under what circumstances the prns are to be used.  Please see instructions for details which were reviewed in writing and the patient given a copy.

## 2015-09-21 NOTE — Patient Instructions (Addendum)
Pantoprazole (protonix) 40 mg   Take  30-60 min before first meal of the day and Pepcid (famotidine)  20 mg one @  bedtime until return to office - this is the best way to tell whether stomach acid is contributing to your problem.    GERD (REFLUX)  is an extremely common cause of respiratory symptoms just like yours , many times with no obvious heartburn at all.    It can be treated with medication, but also with lifestyle changes including elevation of the head of your bed (ideally with 6 inch  bed blocks),  Smoking cessation, avoidance of late meals, excessive alcohol, and avoid fatty foods, chocolate, peppermint, colas, red wine, and acidic juices such as orange juice.  NO MINT OR MENTHOL PRODUCTS SO NO COUGH DROPS   USE SUGARLESS CANDY INSTEAD (Jolley ranchers or Stover's or Life Savers) or even ice chips will also do - the key is to swallow to prevent all throat clearing. NO OIL BASED VITAMINS - use powdered substitutes.    Please schedule a follow up office visit in 6 weeks, call sooner if needed with  6 min walk   On return

## 2015-09-21 NOTE — Progress Notes (Signed)
Subjective:    Patient ID: Kim [name], female    DOB: 1940-11-04, 75 y.o.   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 Pulmonary Consult    Referred per Dr. Parke Simmers. Pt c/o cough x 2 months- non prod and esp worse at night and when exposed to strong smells.   indolent onset dry coughing, worse x 2 months with  day > noct  On hyzar since at least 2011 Walks up to 4 blocks when cool s  sob Main problem is smelling cigs/ cleaning solutions.  No previous hx ca, chemo, connective tissue dz, exp to macrodantin or amiodarone to her knowledge rec Pantoprazole (protonix) 40 mg   Take 30-60 min before first meal of the day and Pepcid 20 mg one bedtime until return to office - this is the best way to tell whether stomach acid is contributing to your problem.   GERD diet    12/06/2013 f/u ov/Delsie Amador re: PF presumably from remote ali related to chemical exp/ cough  Chief Complaint  Patient presents with  . Follow-up    Pt states that her cough has improved some since the last visit. She states that she mainly only coughs at night or if she gets hot.   coughs some still  But mostly p supper, not while sleeping unless gets hot  Walks up to 30 min, some inclines - no sob Not limited by breathing from desired activities rec Change pepcid to where you take it after supper to see if helps your night time cough We will refill your medications when they run out but  don't stop either acid suppressor as they may be helping your cough  Prevnar and flu shots given today     03/28/2014 f/u ov/Dalaysia Harms re: PF from remote ALI Chief Complaint  Patient presents with  . Follow-up    Pt c/o occasional prod cough with clear mucus, no other complaints at this time.   noct cough better on pepcid p supper  rec To get the most out of exercise, you need to be continuously aware that you are short of breath, but never out of breath, for 30 minutes daily. As you improve, it will actually be easier for you to do the same amount of exercise  in  30 minutes so always push to the level where you are short of breath.   Please schedule a follow up visit in 3 months  > did not do    09/21/2015  Consultation Gabor Lusk GU:[id number]/ no longer on any gerd rx / abn CTchest c/w UIP Chief Complaint  Patient presents with  . Pulmonary Consult    Referred by Dr Margaretmary Bayley for eval of abnormal ct chest. She c/o increased cough, esp at night. Cough is prod at times with clear sputum. She also c/o occ DOE "not too often".   Not limited by breathing from desired  activities  But by knee pain so very sedentary Cough is worse after supper and hs and doesn't remember whether it flared related to when she stopped the gerd rx as the cough was indolent onset, minimally progressive and minimally productive x months s change with environment, weather or exertion  No obvious other patterns in day to day or daytime variabilty or assoc   cp or chest tightness, subjective wheeze overt sinus or hb symptoms. No unusual exp hx or h/o childhood pna/ asthma or knowledge of premature birth.  Sleeping ok once she's asleep  without nocturnal  or early am exacerbation  of respiratory  c/o's or need for noct saba. Also denies any obvious fluctuation of symptoms with weather or environmental changes or other aggravating or alleviating factors except as outlined above   Current Medications, Allergies, Complete Past Medical  History, Past Surgical History, Family History, and Social History were reviewed in [employer] record.            Review of Systems  Constitutional: Positive for appetite change and unexpected weight change. Negative for chills and fever.  HENT: Positive for trouble swallowing. Negative for congestion, dental problem, ear pain, nosebleeds, postnasal drip, rhinorrhea, sinus pressure, sneezing, sore throat and voice change.   Eyes: Negative for visual disturbance.  Respiratory: Positive for cough and shortness of breath. Negative for choking.   Cardiovascular: Negative for chest pain and leg swelling.  Gastrointestinal: Negative for abdominal pain, diarrhea and vomiting.  Genitourinary: Negative for difficulty urinating.  Musculoskeletal: Negative for arthralgias.  Skin: Negative for rash.  Neurological: Negative for tremors, syncope and headaches.  Hematological: Does not bruise/bleed easily.       Objective:   Physical Exam  amb hoarse bf nad  Wt Readings from Last 3 Encounters:  09/21/15 133 lb (60.3 kg)  10/03/14 142 lb (64.4 kg)  07/06/14 158 lb (71.7 kg)    Vital signs reviewed  - note sats 98% RA on arrival   HEENT: upper dentures/ nl  turbinates, and oropharynx. Nl external ear canals without cough reflex   NECK :  without JVD/Nodes/TM/ nl carotid upstrokes bilaterally   LUNGS: no acc muscle use,   Min insp crackles bases bilaterally s cough on insp    CV:  RRR  no s3 or murmur or increase in P2, no edema   ABD:  soft and nontender with nl inspiratory excursion in the supine position. 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

## 2015-10-25 ENCOUNTER — Emergency Department (HOSPITAL_COMMUNITY): Payer: Medicare Other

## 2015-10-25 ENCOUNTER — Emergency Department (HOSPITAL_COMMUNITY)
Admission: EM | Admit: 2015-10-25 | Discharge: 2015-10-25 | Disposition: A | Discharge status: Home / Self Care | Payer: Medicare Other | Attending: Emergency Medicine | Admitting: Emergency Medicine

## 2015-10-25 ENCOUNTER — Encounter (HOSPITAL_COMMUNITY): Payer: Self-pay | Admitting: *Deleted

## 2015-10-25 DIAGNOSIS — Z7982 Long term (current) use of aspirin: Secondary | ICD-10-CM | POA: Insufficient documentation

## 2015-10-25 DIAGNOSIS — Z5181 Encounter for therapeutic drug level monitoring: Secondary | ICD-10-CM | POA: Diagnosis not present

## 2015-10-25 DIAGNOSIS — R42 Dizziness and giddiness: Secondary | ICD-10-CM | POA: Insufficient documentation

## 2015-10-25 DIAGNOSIS — Z96651 Presence of right artificial knee joint: Secondary | ICD-10-CM | POA: Insufficient documentation

## 2015-10-25 DIAGNOSIS — Z87891 Personal history of nicotine dependence: Secondary | ICD-10-CM | POA: Diagnosis not present

## 2015-10-25 DIAGNOSIS — R0600 Dyspnea, unspecified: Secondary | ICD-10-CM | POA: Diagnosis not present

## 2015-10-25 DIAGNOSIS — E119 Type 2 diabetes mellitus without complications: Secondary | ICD-10-CM | POA: Diagnosis not present

## 2015-10-25 DIAGNOSIS — I10 Essential (primary) hypertension: Secondary | ICD-10-CM | POA: Diagnosis not present

## 2015-10-25 DIAGNOSIS — Z7984 Long term (current) use of oral hypoglycemic drugs: Secondary | ICD-10-CM | POA: Diagnosis not present

## 2015-10-25 DIAGNOSIS — Z79899 Other long term (current) drug therapy: Secondary | ICD-10-CM | POA: Insufficient documentation

## 2015-10-25 DIAGNOSIS — R531 Weakness: Secondary | ICD-10-CM

## 2015-10-25 LAB — CBC WITH DIFFERENTIAL/PLATELET
BASOS PCT: 1 %
Basophils Absolute: 0.1 10*3/uL (ref 0.0–0.1)
EOS ABS: 0.3 10*3/uL (ref 0.0–0.7)
EOS PCT: 3 %
HCT: 40.9 % (ref 36.0–46.0)
Hemoglobin: 13.1 g/dL (ref 12.0–15.0)
LYMPHS ABS: 5.6 10*3/uL — AB (ref 0.7–4.0)
Lymphocytes Relative: 46 %
MCH: 26.8 pg (ref 26.0–34.0)
MCHC: 32 g/dL (ref 30.0–36.0)
MCV: 83.6 fL (ref 78.0–100.0)
Monocytes Absolute: 0.6 10*3/uL (ref 0.1–1.0)
Monocytes Relative: 5 %
NEUTROS PCT: 45 %
Neutro Abs: 5.3 10*3/uL (ref 1.7–7.7)
PLATELETS: 317 10*3/uL (ref 150–400)
RBC: 4.89 MIL/uL (ref 3.87–5.11)
RDW: 14.5 % (ref 11.5–15.5)
WBC: 11.9 10*3/uL — AB (ref 4.0–10.5)

## 2015-10-25 LAB — I-STAT CHEM 8, ED
BUN: 15 mg/dL (ref 6–20)
CALCIUM ION: 1.13 mmol/L — AB (ref 1.15–1.40)
CHLORIDE: 98 mmol/L — AB (ref 101–111)
CREATININE: 0.9 mg/dL (ref 0.44–1.00)
GLUCOSE: 101 mg/dL — AB (ref 65–99)
HCT: 42 % (ref 36.0–46.0)
Hemoglobin: 14.3 g/dL (ref 12.0–15.0)
Potassium: 3.9 mmol/L (ref 3.5–5.1)
Sodium: 137 mmol/L (ref 135–145)
TCO2: 28 mmol/L (ref 0–100)

## 2015-10-25 LAB — COMPREHENSIVE METABOLIC PANEL
ALT: 9 U/L — ABNORMAL LOW (ref 14–54)
ANION GAP: 7 (ref 5–15)
AST: 18 U/L (ref 15–41)
Albumin: 3.6 g/dL (ref 3.5–5.0)
Alkaline Phosphatase: 58 U/L (ref 38–126)
BUN: 14 mg/dL (ref 6–20)
CHLORIDE: 100 mmol/L — AB (ref 101–111)
CO2: 29 mmol/L (ref 22–32)
CREATININE: 0.84 mg/dL (ref 0.44–1.00)
Calcium: 9.4 mg/dL (ref 8.9–10.3)
Glucose, Bld: 104 mg/dL — ABNORMAL HIGH (ref 65–99)
POTASSIUM: 3.8 mmol/L (ref 3.5–5.1)
SODIUM: 136 mmol/L (ref 135–145)
Total Bilirubin: 0.4 mg/dL (ref 0.3–1.2)
Total Protein: 7.6 g/dL (ref 6.5–8.1)

## 2015-10-25 LAB — PROTIME-INR
INR: 0.93
PROTHROMBIN TIME: 12.4 s (ref 11.4–15.2)

## 2015-10-25 LAB — APTT: aPTT: 30 seconds (ref 24–36)

## 2015-10-25 LAB — BRAIN NATRIURETIC PEPTIDE: B NATRIURETIC PEPTIDE 5: 13.6 pg/mL (ref 0.0–100.0)

## 2015-10-25 LAB — TROPONIN I

## 2015-10-25 LAB — ETHANOL

## 2015-10-25 LAB — LIPASE, BLOOD: LIPASE: 26 U/L (ref 11–51)

## 2015-10-25 LAB — I-STAT TROPONIN, ED: Troponin i, poc: 0 ng/mL (ref 0.00–0.08)

## 2015-10-25 MED ORDER — SODIUM CHLORIDE 0.9 % IV BOLUS (SEPSIS)
1000.0000 mL | Freq: Once | INTRAVENOUS | Status: AC
  Administered 2015-10-25: 1000 mL via INTRAVENOUS

## 2015-10-25 NOTE — ED Provider Notes (Signed)
[facility]-EMERGENCY DEPT Provider Note   CSN: [id number] Arrival date & time: 10/25/15  1808     History   Chief Complaint Chief Complaint  Patient presents with  . Weakness    HPI Kim [name] is a 75 y.o. female.  HPI   Patient presents with concern of weakness. Symptoms of been present for possibly one year, with associated anorexia, nausea, weight loss, 100 pounds. No remarkable changes today, or the past few days, but patient voices increasing frustration over her lack of capacity, persistent dyspnea or No fever, chills, vomiting, diarrhea.   Past Medical History:  Diagnosis Date  . Constipation due to pain medication   . Diabetes mellitus without complication (HCC)   . GERD (gastroesophageal reflux disease)   . Hyperlipemia   . Hypertension   . Postinflammatory pulmonary fibrosis (HCC)   . Seasonal allergies     Patient Active Problem List   Diagnosis Date Noted  . 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 Postinflammatory pulmonary fibrosis (HCC) 10/22/2013  . Pure hypercholesterolemia 07/29/2012  . Acute pharyngitis 05/21/2012  . DIABETES, TYPE 2 01/16/2010  . HYPERTENSION 01/16/2010  . Upper airway cough syndrome 01/12/2010    Past Surgical History:  Procedure Laterality Date  . ABDOMINAL HYSTERECTOMY    . COLONOSCOPY    . HERNIA REPAIR     X 2  . SMALL INTESTINE SURGERY    . TOTAL KNEE ARTHROPLASTY Right 07/06/2014   Procedure: RIGHT TOTAL KNEE ARTHROPLASTY;  Surgeon: Mckinley Jewel, MD;  Location: [facility] OR;  Service: Orthopedics;  Laterality: Right;    OB History    No data available       Home Medications    Prior to Admission medications   Medication Sig Start Date End Date Taking? Authorizing Provider  acetaminophen (TYLENOL) 500 MG tablet Take 500 mg by mouth every 6 (six) hours as needed for mild pain or moderate pain.   Yes Historical Provider, MD  aspirin EC 81 MG tablet Take 81 mg by mouth daily.   Yes Historical  Provider, MD  dextromethorphan-guaiFENesin (ROBITUSSIN-DM) 10-100 MG/5ML liquid Take 2.5 mLs by mouth every 4 (four) hours as needed for cough.   Yes Historical Provider, MD  famotidine (PEPCID) 20 MG tablet One at bedtime Patient taking differently: Take 20 mg by mouth at bedtime.  09/21/15  Yes Nyoka Cowden, MD  levothyroxine (SYNTHROID, LEVOTHROID) 50 MCG tablet Take 50 mcg by mouth daily before breakfast.   Yes Historical Provider, MD  losartan-hydrochlorothiazide (HYZAAR) 100-12.5 MG per tablet Take 1 tablet by mouth daily.   Yes Historical Provider, MD  metFORMIN (GLUCOPHAGE) 500 MG tablet TAKE 1 TABLET BY MOUTH TWICE A DAY 03/31/13  Yes Eulis Foster, FNP  pantoprazole (PROTONIX) 40 MG tablet Take 1 tablet (40 mg total) by mouth daily. Take 30-60 min before first meal of the day 09/21/15  Yes Nyoka Cowden, MD  polyethylene glycol [facility] / GLYCOLAX) packet Take 17 g by mouth daily as needed for mild constipation.   Yes Historical Provider, MD  simvastatin (ZOCOR) 40 MG tablet Take 40 mg by mouth daily. 05/02/14  Yes Historical Provider, MD    Family History Family History  Problem Relation Age of Onset  . Stomach cancer Sister   . Heart attack Sister     Social History Social History  Substance Use Topics  . Smoking status: Former Smoker    Packs/day: 0.50    Years: 10.00    Types: Cigarettes  Quit date: 02/26/1988  . Smokeless tobacco: Never Used  . Alcohol use No     Allergies   Codeine and Penicillins   Review of Systems Review of Systems  Constitutional: Positive for fatigue.  HENT:       Per HPI, otherwise negative  Respiratory:       Per HPI, otherwise negative  Cardiovascular:       Per HPI, otherwise negative  Gastrointestinal: Negative for vomiting.  Endocrine:       Negative aside from HPI  Genitourinary:       Neg aside from HPI   Musculoskeletal:       Per HPI, otherwise negative  Skin: Negative.   Neurological: Positive for weakness. Negative  for syncope.     Physical Exam Updated Vital Signs BP (P) 144/71   Pulse 112   Temp (P) 97.9 F (36.6 C) (Oral)   Resp (P) 18   SpO2 98%   Physical Exam  Constitutional: She is oriented to person, place, and time. She has a sickly appearance. No distress.  HENT:  Head: Normocephalic and atraumatic.  Eyes: Conjunctivae and EOM are normal.  Cardiovascular: Normal rate and regular rhythm.   Pulmonary/Chest: Effort normal. No stridor. No respiratory distress. She has decreased breath sounds.  Abdominal: She exhibits no distension.    Musculoskeletal: She exhibits no edema.  Neurological: She is alert and oriented to person, place, and time. No cranial nerve deficit.  Skin: Skin is warm and dry.  Psychiatric: She has a normal mood and affect.  Nursing note and vitals reviewed.    ED Treatments / Results  Labs (all labs ordered are listed, but only abnormal results are displayed) Labs Reviewed  COMPREHENSIVE METABOLIC PANEL - Abnormal; Notable for the following:       Result Value   Chloride 100 (*)    Glucose, Bld 104 (*)    ALT 9 (*)    All other components within normal limits  CBC WITH DIFFERENTIAL/PLATELET - Abnormal; Notable for the following:    WBC 11.9 (*)    Lymphs Abs 5.6 (*)    All other components within normal limits  I-STAT CHEM 8, ED - Abnormal; Notable for the following:    Chloride 98 (*)    Glucose, Bld 101 (*)    Calcium, Ion 1.13 (*)    All other components within normal limits  ETHANOL  LIPASE, BLOOD  BRAIN NATRIURETIC PEPTIDE  TROPONIN I  PROTIME-INR  APTT  URINALYSIS, ROUTINE W REFLEX MICROSCOPIC (NOT AT [facility])  CBC  DIFFERENTIAL  I-STAT TROPOININ, ED    EKG  EKG Interpretation None       Radiology Dg Chest 2 View  Result Date: 10/25/2015 CLINICAL DATA:  Weakness for 1 week EXAM: CHEST  2 VIEW COMPARISON:  09/05/2015 and 08/23/2015 FINDINGS: Lungs are under aerated. Fibrotic interstitial lung disease is seen throughout with a  peripheral distribution. Normal heart size. No pneumothorax. No pleural effusion. IMPRESSION: Extensive pulmonary fibrosis. Lungs are less aerated than on the prior study. Electronically Signed   By: Jolaine Click M.D.   On: 10/25/2015 19:12   Ct Head Wo Contrast  Result Date: 10/25/2015 CLINICAL DATA:  Weakness and fatigue with dizziness for server weeks. Symptoms became worse at 5 a.m. today. EXAM: CT HEAD WITHOUT CONTRAST TECHNIQUE: Contiguous axial images were obtained from the base of the skull through the vertex without intravenous contrast. COMPARISON:  None. FINDINGS: Brain: No acute infarct, hemorrhage, or mass lesion is present.  The ventricles are of normal size. No significant extraaxial fluid collection is present. No significant white matter disease is present. Vascular: No significant vascular calcifications or focal hyperdense vessel is present. Skull: The calvarium is intact. Sinuses/Orbits: The paranasal sinuses and mastoid air cells are clear. A remote right medial orbital blowout fracture is present. The globes and orbits are otherwise intact. Other: No significant extracranial soft tissue lesion is present. IMPRESSION: 1. No acute or focal intracranial abnormality to explain the patient's symptoms. 2. Remote right orbital blowout fracture. No acute orbital pathology. Electronically Signed   By: Marin Robertshristopher  Mattern M.D.   On: 10/25/2015 19:07    Procedures Procedures (including critical care time)  Medications Ordered in ED Medications  sodium chloride 0.9 % bolus 1,000 mL (not administered)   On chart review is clear that the patient has previously been diagnosed with pulmonary fibrosis.  Patient was scheduled for follow-up every 3 months with pulmonology, but the last office visit is listed as February, 2017.  Initial Impression / Assessment and Plan / ED Course  I have reviewed the triage vital signs and the nursing notes.  Pertinent labs & imaging results that were available  during my care of the patient were reviewed by me and considered in my medical decision making (see chart for details).  Clinical Course   Elderly female presents with concern of ongoing weakness. Here the patient is awake and alert, hemodynamically stable. Evaluation here largely reassuring, no evidence for new pneumonia, bacteremia, sepsis. Patient does have history of pulmonary fibrosis, which she does not seem to fully appreciate. We discussed this, the need to follow-up with pulmonology for further evaluation. Absent other acute findings, patient discharged in stable condition to see pulmonology in the office.   Gerhard Munchobert Nekayla Heider, MD 10/25/15 2104

## 2015-10-25 NOTE — Discharge Instructions (Signed)
As discussed, and his primary follow-up with your primary care physician, and your pulmonologist.  Please stay well hydrated, monitor your condition carefully, and do not hesitate to return here for concerning changes in your condition.

## 2015-10-25 NOTE — ED Triage Notes (Signed)
The pt is c/o a headache since 0500am today with weakness also.  She reports that she has had weight loss for one year she has been seeing her 3 doctors.  She is tearful in triage

## 2015-10-25 NOTE — ED Notes (Signed)
Pt back from CT

## 2015-12-05 ENCOUNTER — Ambulatory Visit: Payer: Medicare Other | Admitting: Internal Medicine

## 2015-12-20 ENCOUNTER — Other Ambulatory Visit: Payer: Self-pay | Admitting: Internal Medicine

## 2015-12-20 MED ORDER — FAMOTIDINE 20 MG PO TABS: 20.0000 mg | ORAL_TABLET | Freq: Every day | ORAL | 0 refills | Status: DC

## 2015-12-27 ENCOUNTER — Ambulatory Visit: Payer: Medicare Other | Admitting: Internal Medicine

## 2016-01-25 ENCOUNTER — Emergency Department (HOSPITAL_COMMUNITY): Payer: Medicare Other

## 2016-01-25 ENCOUNTER — Other Ambulatory Visit: Payer: Self-pay

## 2016-01-25 ENCOUNTER — Encounter (HOSPITAL_COMMUNITY): Payer: Self-pay

## 2016-01-25 ENCOUNTER — Inpatient Hospital Stay (HOSPITAL_COMMUNITY)
Admission: EM | Admit: 2016-01-25 | Discharge: 2016-01-28 | DRG: 193 | Disposition: A | Discharge status: Home Health | Payer: Medicare Other | Attending: Internal Medicine | Admitting: Internal Medicine

## 2016-01-25 DIAGNOSIS — Z8249 Family history of ischemic heart disease and other diseases of the circulatory system: Secondary | ICD-10-CM

## 2016-01-25 DIAGNOSIS — E039 Hypothyroidism, unspecified: Secondary | ICD-10-CM | POA: Diagnosis present

## 2016-01-25 DIAGNOSIS — J189 Pneumonia, unspecified organism: Principal | ICD-10-CM | POA: Diagnosis present

## 2016-01-25 DIAGNOSIS — Z87891 Personal history of nicotine dependence: Secondary | ICD-10-CM

## 2016-01-25 DIAGNOSIS — I5032 Chronic diastolic (congestive) heart failure: Secondary | ICD-10-CM | POA: Diagnosis present

## 2016-01-25 DIAGNOSIS — Z800 Family history of malignant neoplasm of digestive organs: Secondary | ICD-10-CM

## 2016-01-25 DIAGNOSIS — I110 Hypertensive heart disease with heart failure: Secondary | ICD-10-CM | POA: Diagnosis present

## 2016-01-25 DIAGNOSIS — E118 Type 2 diabetes mellitus with unspecified complications: Secondary | ICD-10-CM | POA: Diagnosis present

## 2016-01-25 DIAGNOSIS — Z79818 Long term (current) use of other agents affecting estrogen receptors and estrogen levels: Secondary | ICD-10-CM

## 2016-01-25 DIAGNOSIS — E119 Type 2 diabetes mellitus without complications: Secondary | ICD-10-CM | POA: Diagnosis present

## 2016-01-25 DIAGNOSIS — K219 Gastro-esophageal reflux disease without esophagitis: Secondary | ICD-10-CM | POA: Diagnosis present

## 2016-01-25 DIAGNOSIS — J9601 Acute respiratory failure with hypoxia: Secondary | ICD-10-CM | POA: Diagnosis present

## 2016-01-25 DIAGNOSIS — J8410 Pulmonary fibrosis, unspecified: Secondary | ICD-10-CM | POA: Diagnosis present

## 2016-01-25 DIAGNOSIS — E860 Dehydration: Secondary | ICD-10-CM | POA: Diagnosis present

## 2016-01-25 DIAGNOSIS — E669 Obesity, unspecified: Secondary | ICD-10-CM

## 2016-01-25 DIAGNOSIS — R0902 Hypoxemia: Secondary | ICD-10-CM

## 2016-01-25 DIAGNOSIS — E1169 Type 2 diabetes mellitus with other specified complication: Secondary | ICD-10-CM

## 2016-01-25 DIAGNOSIS — E890 Postprocedural hypothyroidism: Secondary | ICD-10-CM | POA: Diagnosis present

## 2016-01-25 DIAGNOSIS — Z7982 Long term (current) use of aspirin: Secondary | ICD-10-CM

## 2016-01-25 DIAGNOSIS — Z96651 Presence of right artificial knee joint: Secondary | ICD-10-CM | POA: Diagnosis present

## 2016-01-25 DIAGNOSIS — Z79899 Other long term (current) drug therapy: Secondary | ICD-10-CM

## 2016-01-25 DIAGNOSIS — Z7984 Long term (current) use of oral hypoglycemic drugs: Secondary | ICD-10-CM

## 2016-01-25 DIAGNOSIS — Z880 Allergy status to penicillin: Secondary | ICD-10-CM

## 2016-01-25 DIAGNOSIS — I10 Essential (primary) hypertension: Secondary | ICD-10-CM | POA: Diagnosis present

## 2016-01-25 DIAGNOSIS — E7800 Pure hypercholesterolemia, unspecified: Secondary | ICD-10-CM | POA: Diagnosis present

## 2016-01-25 LAB — COMPREHENSIVE METABOLIC PANEL
ALT: 7 U/L — AB (ref 14–54)
AST: 16 U/L (ref 15–41)
Albumin: 2.9 g/dL — ABNORMAL LOW (ref 3.5–5.0)
Alkaline Phosphatase: 58 U/L (ref 38–126)
Anion gap: 8 (ref 5–15)
BILIRUBIN TOTAL: 0.2 mg/dL — AB (ref 0.3–1.2)
BUN: 13 mg/dL (ref 6–20)
CHLORIDE: 105 mmol/L (ref 101–111)
CO2: 23 mmol/L (ref 22–32)
CREATININE: 0.93 mg/dL (ref 0.44–1.00)
Calcium: 9.4 mg/dL (ref 8.9–10.3)
GFR, EST NON AFRICAN AMERICAN: 59 mL/min — AB (ref >60)
Glucose, Bld: 115 mg/dL — ABNORMAL HIGH (ref 65–99)
Potassium: 3.9 mmol/L (ref 3.5–5.1)
Sodium: 136 mmol/L (ref 135–145)
TOTAL PROTEIN: 7.5 g/dL (ref 6.5–8.1)

## 2016-01-25 LAB — TROPONIN I

## 2016-01-25 LAB — URINALYSIS, ROUTINE W REFLEX MICROSCOPIC
BILIRUBIN URINE: NEGATIVE
Glucose, UA: NEGATIVE mg/dL
Hgb urine dipstick: NEGATIVE
KETONES UR: NEGATIVE mg/dL
LEUKOCYTES UA: NEGATIVE
NITRITE: NEGATIVE
PH: 5 (ref 5.0–8.0)
PROTEIN: NEGATIVE mg/dL
Specific Gravity, Urine: 1.025 (ref 1.005–1.030)

## 2016-01-25 LAB — I-STAT ARTERIAL BLOOD GAS, ED
ACID-BASE DEFICIT: 1 mmol/L (ref 0.0–2.0)
BICARBONATE: 22.8 mmol/L (ref 20.0–28.0)
O2 Saturation: 98 %
PH ART: 7.409 (ref 7.350–7.450)
TCO2: 24 mmol/L (ref 0–100)
pCO2 arterial: 36.2 mmHg (ref 32.0–48.0)
pO2, Arterial: 106 mmHg (ref 83.0–108.0)

## 2016-01-25 LAB — TSH: TSH: 2.04 u[IU]/mL (ref 0.350–4.500)

## 2016-01-25 LAB — CBC WITH DIFFERENTIAL/PLATELET
BASOS ABS: 0.1 10*3/uL (ref 0.0–0.1)
Basophils Relative: 1 %
EOS ABS: 0.3 10*3/uL (ref 0.0–0.7)
EOS PCT: 3 %
HEMATOCRIT: 39.4 % (ref 36.0–46.0)
Hemoglobin: 13.2 g/dL (ref 12.0–15.0)
LYMPHS ABS: 2.9 10*3/uL (ref 0.7–4.0)
Lymphocytes Relative: 27 %
MCH: 27.1 pg (ref 26.0–34.0)
MCHC: 33.5 g/dL (ref 30.0–36.0)
MCV: 80.9 fL (ref 78.0–100.0)
MONO ABS: 0.4 10*3/uL (ref 0.1–1.0)
Monocytes Relative: 4 %
NEUTROS PCT: 65 %
Neutro Abs: 7.1 10*3/uL (ref 1.7–7.7)
PLATELETS: 415 10*3/uL — AB (ref 150–400)
RBC: 4.87 MIL/uL (ref 3.87–5.11)
RDW: 15.6 % — AB (ref 11.5–15.5)
WBC: 10.8 10*3/uL — AB (ref 4.0–10.5)

## 2016-01-25 LAB — C-REACTIVE PROTEIN: CRP: 4.7 mg/dL — ABNORMAL HIGH (ref <1.0)

## 2016-01-25 LAB — PHOSPHORUS: PHOSPHORUS: 3.1 mg/dL (ref 2.5–4.6)

## 2016-01-25 LAB — MAGNESIUM: MAGNESIUM: 1.8 mg/dL (ref 1.7–2.4)

## 2016-01-25 LAB — I-STAT CG4 LACTIC ACID, ED: LACTIC ACID, VENOUS: 0.7 mmol/L (ref 0.5–1.9)

## 2016-01-25 LAB — PROCALCITONIN: Procalcitonin: <0.1 ng/mL

## 2016-01-25 LAB — CBG MONITORING, ED
GLUCOSE-CAPILLARY: 208 mg/dL — AB (ref 65–99)
GLUCOSE-CAPILLARY: 166 mg/dL — AB (ref 65–99)

## 2016-01-25 LAB — SEDIMENTATION RATE: Sed Rate: 60 mm/hr — ABNORMAL HIGH (ref 0–22)

## 2016-01-25 LAB — BRAIN NATRIURETIC PEPTIDE: B NATRIURETIC PEPTIDE 5: 51.7 pg/mL (ref 0.0–100.0)

## 2016-01-25 LAB — CK: CK TOTAL: 20 U/L — AB (ref 38–234)

## 2016-01-25 MED ORDER — DEXTROSE 5 % IV SOLN
500.0000 mg | Freq: Once | INTRAVENOUS | Status: AC
  Administered 2016-01-25: 500 mg via INTRAVENOUS
  Filled 2016-01-25: qty 500

## 2016-01-25 MED ORDER — INSULIN ASPART 100 UNIT/ML ~~LOC~~ SOLN
0.0000 [IU] | Freq: Three times a day (TID) | SUBCUTANEOUS | Status: DC
  Administered 2016-01-25: 2 [IU] via SUBCUTANEOUS
  Administered 2016-01-26: 7 [IU] via SUBCUTANEOUS
  Administered 2016-01-26: 5 [IU] via SUBCUTANEOUS
  Administered 2016-01-26 – 2016-01-27 (×2): 2 [IU] via SUBCUTANEOUS
  Administered 2016-01-27: 1 [IU] via SUBCUTANEOUS
  Administered 2016-01-27 – 2016-01-28 (×3): 2 [IU] via SUBCUTANEOUS
  Filled 2016-01-25: qty 1

## 2016-01-25 MED ORDER — LEVOTHYROXINE SODIUM 50 MCG PO TABS
50.0000 ug | ORAL_TABLET | Freq: Every day | ORAL | Status: DC
  Administered 2016-01-26 – 2016-01-28 (×3): 50 ug via ORAL
  Filled 2016-01-25 (×3): qty 1

## 2016-01-25 MED ORDER — ENOXAPARIN SODIUM 40 MG/0.4ML ~~LOC~~ SOLN
40.0000 mg | SUBCUTANEOUS | Status: DC
  Administered 2016-01-25 – 2016-01-27 (×3): 40 mg via SUBCUTANEOUS
  Filled 2016-01-25 (×3): qty 0.4

## 2016-01-25 MED ORDER — METHYLPREDNISOLONE SODIUM SUCC 125 MG IJ SOLR
125.0000 mg | Freq: Once | INTRAMUSCULAR | Status: AC
  Administered 2016-01-25: 125 mg via INTRAVENOUS
  Filled 2016-01-25: qty 2

## 2016-01-25 MED ORDER — ACETAMINOPHEN 650 MG RE SUPP
650.0000 mg | Freq: Four times a day (QID) | RECTAL | Status: DC | PRN
Start: 2016-01-25 — End: 2016-01-28

## 2016-01-25 MED ORDER — PANTOPRAZOLE SODIUM 40 MG PO TBEC: 40.0000 mg | DELAYED_RELEASE_TABLET | Freq: Every day | ORAL | Status: DC

## 2016-01-25 MED ORDER — IOPAMIDOL (ISOVUE-370) INJECTION 76%
INTRAVENOUS | Status: AC
  Administered 2016-01-25: 100 mL
  Filled 2016-01-25: qty 100

## 2016-01-25 MED ORDER — CEFTRIAXONE SODIUM 1 G IJ SOLR
1.0000 g | Freq: Once | INTRAMUSCULAR | Status: AC
  Administered 2016-01-25: 1 g via INTRAVENOUS
  Filled 2016-01-25: qty 10

## 2016-01-25 MED ORDER — INSULIN ASPART 100 UNIT/ML ~~LOC~~ SOLN
0.0000 [IU] | Freq: Every day | SUBCUTANEOUS | Status: DC
  Administered 2016-01-25 – 2016-01-27 (×2): 2 [IU] via SUBCUTANEOUS

## 2016-01-25 MED ORDER — FAMOTIDINE 20 MG PO TABS
20.0000 mg | ORAL_TABLET | Freq: Every day | ORAL | Status: DC
  Administered 2016-01-25 – 2016-01-27 (×3): 20 mg via ORAL
  Filled 2016-01-25 (×3): qty 1

## 2016-01-25 MED ORDER — MEGESTROL ACETATE 40 MG/ML PO SUSP
200.0000 mg | Freq: Every day | ORAL | Status: DC
Start: 2016-01-26 — End: 2016-01-28
  Administered 2016-01-26 – 2016-01-28 (×3): 200 mg via ORAL
  Filled 2016-01-25 (×3): qty 5

## 2016-01-25 MED ORDER — ACETAMINOPHEN 325 MG PO TABS
650.0000 mg | ORAL_TABLET | Freq: Four times a day (QID) | ORAL | Status: DC | PRN
Start: 2016-01-25 — End: 2016-01-28
  Administered 2016-01-26 – 2016-01-27 (×2): 650 mg via ORAL
  Filled 2016-01-25 (×2): qty 2

## 2016-01-25 MED ORDER — ALBUTEROL SULFATE (2.5 MG/3ML) 0.083% IN NEBU: 2.5000 mg | INHALATION_SOLUTION | RESPIRATORY_TRACT | Status: DC | PRN

## 2016-01-25 MED ORDER — LEVOFLOXACIN IN D5W 500 MG/100ML IV SOLN
500.0000 mg | INTRAVENOUS | Status: DC
  Administered 2016-01-26: 500 mg via INTRAVENOUS
  Filled 2016-01-25 (×3): qty 100

## 2016-01-25 MED ORDER — ORAL CARE MOUTH RINSE
15.0000 mL | Freq: Two times a day (BID) | OROMUCOSAL | Status: DC
  Administered 2016-01-27 – 2016-01-28 (×2): 15 mL via OROMUCOSAL

## 2016-01-25 MED ORDER — METHYLPREDNISOLONE SODIUM SUCC 125 MG IJ SOLR
60.0000 mg | Freq: Four times a day (QID) | INTRAMUSCULAR | Status: DC
  Administered 2016-01-25 – 2016-01-28 (×10): 60 mg via INTRAVENOUS
  Filled 2016-01-25 (×10): qty 2

## 2016-01-25 MED ORDER — IPRATROPIUM-ALBUTEROL 0.5-2.5 (3) MG/3ML IN SOLN
3.0000 mL | Freq: Four times a day (QID) | RESPIRATORY_TRACT | Status: DC
  Administered 2016-01-25 – 2016-01-26 (×3): 3 mL via RESPIRATORY_TRACT
  Filled 2016-01-25 (×3): qty 3

## 2016-01-25 MED ORDER — SODIUM CHLORIDE 0.9 % IV SOLN
INTRAVENOUS | Status: DC
  Administered 2016-01-25 (×2): via INTRAVENOUS

## 2016-01-25 MED ORDER — LOSARTAN POTASSIUM 50 MG PO TABS
100.0000 mg | ORAL_TABLET | Freq: Every day | ORAL | Status: DC
  Administered 2016-01-25 – 2016-01-28 (×4): 100 mg via ORAL
  Filled 2016-01-25 (×4): qty 2

## 2016-01-25 MED ORDER — ASPIRIN EC 81 MG PO TBEC
81.0000 mg | DELAYED_RELEASE_TABLET | Freq: Every day | ORAL | Status: DC
Start: 2016-01-25 — End: 2016-01-28
  Administered 2016-01-25 – 2016-01-28 (×4): 81 mg via ORAL
  Filled 2016-01-25 (×4): qty 1

## 2016-01-25 NOTE — ED Notes (Signed)
Pt ambulated to restroom with this RN and back to bed. Pt able to take small steps and walk independently. Pt observed to be short of breath by the time she got back into bed. VSS

## 2016-01-25 NOTE — Progress Notes (Signed)
Called per floor RN on [facility] at 2005 regarding pending admit from  ED. Report received from ED RN, floor RN concerned Pt is inappropriate for floor admit. Pt seen at 2010. Found resting in bed, alert oriented x 4, denies pain. Pt does not appear to be in distress, family at bedside.  Able to speak in full sentences at this time. Complains of mild SOB worsening with exertion. NEB treatment just completed and Pt admits to feeling better after NEB. RR 20s with speaking, po2 100% on 3 LNC.  Per Caryn BeeKevin RN Pt has had mild SOB since admit. Per MD Pt to be admitted for observation as [place]Surg [place] [facility] [place] shortly. Advised Patient and [facility] RN that RRT will follow Pt tonight and to call if needed.

## 2016-01-25 NOTE — H&P (Signed)
History and Physical    Kim [name]:[id number] DOB: 12/10/1940 DOA: 01/25/2016   PCP: Elyn Peers, MD   Patient coming from/Resides with: Private residence/lives alone but daughter checks on her frequently  Admission status: Observation/medical floor -it may be medically necessary to stay a minimum 2 midnights to rule out impending and/or unexpected changes in physiologic status that may differ from initial evaluation performed in the ER and/or at time of admission therefore please consider reevaluation of admission status in 24 hours.   Chief Complaint: Shortness of breath and malaise for 2 weeks  HPI: Kim [name] is a 75 y.o. female with medical history significant for diabetes on metformin, hypertension, dyslipidemia, and hypothyroidism. He has a history of childhood asthma and developed worsening cough and dyspnea symptoms beginning in 2015 for which she was referred to pulmonology/Dr. Annamaria Boots and Melvyn Novas. She has had abnormal chest x-ray finding since 2006 concern for pulmonary fibrosis. Prior CT of the chest compatible with interstitial lung disease. She has experienced documented decrease in pulse oximetry with ambulation. CT of the chest in July 2017 consistent with UIP with max GERD oral therapy for 6 weeks. She returns today with reports of 2 weeks of shortness of breath/DOE (worsened today) with generalized malaise ongoing for at least 1 month. This is been associated with a dry cough and apparent increased heart rate prior to arrival. Upon arrival to the ER her room air saturations were 86% with a heart rate of 108 and respiratory rate of 40. Oxygen was applied with improvement of sats to 94% and decrease in respiratory rate 26 breaths per minute. ABG on oxygen with PCO2 36 PO2 106. Chest x-ray demonstrating either progression of pulmonary fibrosis versus acute process such as atypical infection therefore ER has treated as atypical presentation of community-acquired pneumonia. CT  of the chest has been ordered by EDP to rule out possible PE as well.  In discussion with the patient, she has had progressive shortness of breath and "tiredness" for 2 weeks- worse in the past 24 hours. This has been associated with a dry cough but no fevers. The past 3 days her urine has been slightly darker than usual and she's had increased urinary frequency. She also had a mild nosebleed yesterday. She did not receive a flu shot.  ED Course:  Vital Signs: BP 115/70   Pulse 101   Temp 97.8 F (36.6 C) (Oral)   Resp 26   Ht 5' 9"  (1.753 m)   Wt 58.5 kg (129 lb)   SpO2 94%   BMI 19.05 kg/m  PCXR: Pulmonary fibrosis with generalized increase in interstitial opacity that may reflect progression of interstitial lung disease or could reflect an acute process such as atypical infection CT angiography of chest: Pending Lab data: Sodium 136, potassium 3.9, CO2 23, BUN 13, creatinine 0.93, glucose 1:15, LFTs normal, BNP 52, troponin less than 0.03, lactic acid 0.7, white count 10,800 with normal differential, hemoglobin 13, platelets 415,000, TSH 2.040 Medications and treatments: Solu-Medrol 125 mg IV 1  Review of Systems:  In addition to the HPI above,  No Fever-chills, myalgias or other constitutional symptoms No Headache, changes with Vision or hearing, new weakness, tingling, numbness in any extremity, dizziness, dysarthria or word finding difficulty, gait disturbance or imbalance, tremors or seizure activity No problems swallowing food or Liquids, indigestion/reflux, choking or coughing while eating, abdominal pain with or after eating No Chest pain, palpitations, orthopnea  No Abdominal pain, N/V, melena,hematochezia, dark tarry stools, constipation No  dysuria, malodorous urine, hematuria or flank pain but does report increased urinary frequency for 3 days No new skin rashes, lesions, masses or bruises, No new joint pains, aches, swelling or redness No recent unintentional weight gain  or loss No polyuria, polydypsia or polyphagia   Past Medical History:  Diagnosis Date  . Constipation due to pain medication   . Diabetes mellitus without complication ([place])   . GERD (gastroesophageal reflux disease)   . Hyperlipemia   . Hypertension   . Postinflammatory pulmonary fibrosis ([place])   . Seasonal allergies     Past Surgical History:  Procedure Laterality Date  . ABDOMINAL HYSTERECTOMY    . COLONOSCOPY    . HERNIA REPAIR     X 2  . SMALL INTESTINE SURGERY    . TOTAL KNEE ARTHROPLASTY Right 07/06/2014   Procedure: RIGHT TOTAL KNEE ARTHROPLASTY;  Surgeon: Kathryne Hitch, MD;  Location: [facility];  Service: Orthopedics;  Laterality: Right;    Social History   Social History  . Marital status: Married    Spouse name: N/A  . Number of children: N/A  . Years of education: N/A   Occupational History  . Not on file.   Social History Main Topics  . Smoking status: Former Smoker    Packs/day: 0.50    Years: 10.00    Types: Cigarettes    Quit date: 02/26/1988  . Smokeless tobacco: Never Used  . Alcohol use No  . Drug use: No  . Sexual activity: Not on file   Other Topics Concern  . Not on file   Social History Narrative  . No narrative on file    Mobility: No devices Work history: Not obtained   Allergies  Allergen Reactions  . Codeine Nausea And Vomiting    "Takes my appetite"  . Penicillins Rash    Has patient had a PCN reaction causing immediate rash, facial/tongue/throat swelling, SOB or lightheadedness with hypotension: Yes Has patient had a PCN reaction causing severe rash involving mucus membranes or skin necrosis: No Has patient had a PCN reaction that required hospitalization No Has patient had a PCN reaction occurring within the last 10 years: No If all of the above answers are "NO", then may proceed with Cephalosporin use.     Family History  Problem Relation Age of Onset  . Stomach cancer Sister   . Heart attack Sister      Prior to  Admission medications   Medication Sig Start Date End Date Taking? Authorizing Provider  acetaminophen (TYLENOL) 500 MG tablet Take 500 mg by mouth every 6 (six) hours as needed for mild pain or moderate pain.   Yes Historical Provider, MD  aspirin EC 81 MG tablet Take 81 mg by mouth daily.   Yes Historical Provider, MD  famotidine (PEPCID) 20 MG tablet Take 1 tablet (20 mg total) by mouth at bedtime. 12/20/15  Yes Tanda Rockers, MD  guaiFENesin-codeine [facility]) 100-10 MG/5ML syrup Take 5-10 mLs by mouth every 8 (eight) hours as needed for cough.   Yes Historical Provider, MD  levothyroxine (SYNTHROID, LEVOTHROID) 50 MCG tablet Take 50 mcg by mouth daily before breakfast.   Yes Historical Provider, MD  losartan-hydrochlorothiazide (HYZAAR) 100-12.5 MG per tablet Take 1 tablet by mouth daily.   Yes Historical Provider, MD  megestrol (MEGACE) 40 MG/ML suspension Take 200 mg by mouth daily.   Yes Historical Provider, MD  metFORMIN (GLUCOPHAGE) 500 MG tablet TAKE 1 TABLET BY MOUTH TWICE A DAY 03/31/13  Yes Kennyth Arnold, FNP  pantoprazole (PROTONIX) 40 MG tablet Take 1 tablet (40 mg total) by mouth daily. Take 30-60 min before first meal of the day Patient not taking: Reported on 01/25/2016 09/21/15   Tanda Rockers, MD    Physical Exam: Vitals:   01/25/16 1319 01/25/16 1524 01/25/16 1530  BP: 141/78 125/73 115/70  Pulse: 116 108 101  Resp: (!) 40 (!) 44 26  Temp: 97.8 F (36.6 C)    TempSrc: Oral    SpO2: (!) 86% 97% 94%  Weight: 58.5 kg (129 lb)    Height: 5' 9"  (1.753 m)        Constitutional: NAD, calm, comfortable Eyes: PERRL, lids and conjunctivae normal ENMT: Mucous membranes are moist. Posterior pharynx clear of any exudate or lesions.Normal dentition.  Neck: normal, supple, no masses, no thyromegaly Respiratory: Diffuse, fine Inspiratory and expiratory crackles, still experiencing resting tachypnea although less than presentation with current resting respiratory rate  between 26 and 31 breaths per minute, No accessory muscle use. O2 at 2 L Cardiovascular: Regular slightly tachycardic rate and rhythm, no murmurs / rubs / gallops. No extremity edema. 2+ pedal pulses. No carotid bruits.  Abdomen: no tenderness, no masses palpated. No hepatosplenomegaly. Bowel sounds positive.  Musculoskeletal: no clubbing / cyanosis. No joint deformity upper and lower extremities. Good ROM, no contractures. Normal muscle tone.  Skin: no rashes, lesions, ulcers. No induration Neurologic: CN 2-12 grossly intact. Sensation intact, DTR normal. Strength 5/5 x all 4 extremities.  Psychiatric: Normal judgment and insight. Alert and oriented x 3. Normal mood.    Labs on Admission: I have personally reviewed following labs and imaging studies  CBC:  Recent Labs Lab 01/25/16 1341  WBC 10.8*  NEUTROABS 7.1  HGB 13.2  HCT 39.4  MCV 80.9  PLT 654*   Basic Metabolic Panel:  Recent Labs Lab 01/25/16 1341  NA 136  K 3.9  CL 105  CO2 23  GLUCOSE 115*  BUN 13  CREATININE 0.93  CALCIUM 9.4   GFR: Estimated Creatinine Clearance: 49 mL/min (by C-G formula based on SCr of 0.93 mg/dL). Liver Function Tests:  Recent Labs Lab 01/25/16 1341  AST 16  ALT 7*  ALKPHOS 58  BILITOT 0.2*  PROT 7.5  ALBUMIN 2.9*   No results for input(s): LIPASE, AMYLASE in the last 168 hours. No results for input(s): AMMONIA in the last 168 hours. Coagulation Profile: No results for input(s): INR, PROTIME in the last 168 hours. Cardiac Enzymes:  Recent Labs Lab 01/25/16 1341  TROPONINI <0.03   BNP (last 3 results) No results for input(s): PROBNP in the last 8760 hours. HbA1C: No results for input(s): HGBA1C in the last 72 hours. CBG: No results for input(s): GLUCAP in the last 168 hours. Lipid Profile: No results for input(s): CHOL, HDL, LDLCALC, TRIG, CHOLHDL, LDLDIRECT in the last 72 hours. Thyroid Function Tests:  Recent Labs  01/25/16 1341  TSH 2.040   Anemia  Panel: No results for input(s): VITAMINB12, FOLATE, FERRITIN, TIBC, IRON, RETICCTPCT in the last 72 hours. Urine analysis:    Component Value Date/Time   COLORURINE YELLOW 10/04/2014 0100   APPEARANCEUR CLEAR 10/04/2014 0100   LABSPEC 1.045 (H) 10/04/2014 0100   PHURINE 5.5 10/04/2014 0100   GLUCOSEU NEGATIVE 10/04/2014 0100   HGBUR NEGATIVE 10/04/2014 0100   BILIRUBINUR NEGATIVE 10/04/2014 0100   KETONESUR NEGATIVE 10/04/2014 0100   PROTEINUR NEGATIVE 10/04/2014 0100   UROBILINOGEN 0.2 10/04/2014 0100   NITRITE NEGATIVE 10/04/2014 0100  LEUKOCYTESUR LARGE (A) 10/04/2014 0100   Sepsis Labs: @LABRCNTIP (procalcitonin:4,lacticidven:4) )No results found for this or any previous visit (from the past 240 hour(s)).   Radiological Exams on Admission: Dg Chest Port 1 View  Result Date: 01/25/2016 CLINICAL DATA:  Shortness of breath for 1 week EXAM: PORTABLE CHEST 1 VIEW COMPARISON:  10/25/2015 FINDINGS: Low volume chest with coarse interstitial opacities correlating with known pulmonary fibrosis. There is generalized increase in interstitial opacity. No focal consolidation, effusion, or pneumothorax. Normal heart size. IMPRESSION: Pulmonary fibrosis. Generalized increase in interstitial opacity compared to 10/25/2015 may reflect progression of interstitial lung disease or acute process, including atypical infection. Electronically Signed   By: Monte Fantasia M.D.   On: 01/25/2016 13:39    EKG: (Independently reviewed) sinus tachycardia with ventricular rate 112 bpm, QTC 429 ms, no ischemic changes  Assessment/Plan Principal Problem:   Acute respiratory failure with hypoxia -He sits with acute hypoxemia and history of known postinflammatory pulmonary fibrosis previously treated with high-dose PPI -Follow up on CT angiogram of the chest to rule out PE -Continue supportive care with oxygen -Treat underlying causes  Active Problems:   Postinflammatory pulmonary fibrosis (history  of) -Has been evaluated by pulmonary and outpatient setting and treated with high-dose PPI for 6 weeks -Echocardiogram to check for pulmonary hypertension-if positive may need right heart cath to better clarify -Check ESR and CRP -Solu-Medrol 60 mg IV every 6 hours -Continue PPI and H2 blocker   ? Atypical pneumonia -Has mild leukocytosis with normal differential -Radiologist questioning atypical presentation of pneumonia-FU chest CT -Procalcitonin -Respiratory viral panel -Empiric antibiotics to cover for atypicals utilizing pneumonia order set -Check blood cultures, sputum culture, urinary strep and Legionella   Mild dehydration -Hold preadmission thiazide diuretic -CK -IV fluid at 50/hr -Patient reports dark urine with increased urinary frequency so for completeness of exam will check urinalysis and culture    Diabetes mellitus type 2 in obese  -Hold preadmission metformin in setting of acute illness and receipt of IV contrast -Hemoglobin A1c -SSI (sensitive scale)    HTN (hypertension) -Appears mildly volume depleted so will hold hydrochlorothiazide but continue losartan since renal function is otherwise normal    Pure hypercholesterolemia -Not on medical therapy prior to admission    Hypothyroidism -Continue Synthroid -TSH normal -NM thyroid uptake in March 2017 revealed findings consistent with Graves' disease-?? Has undergone RAI treatment      DVT prophylaxis: Lovenox Code Status: Full  Family Communication: Daughter at bedside Disposition Plan: Anticipate discharge preadmission home environment when medically stable Consults called: None     Romello Hoehn L. ANP-BC [facility] Pager [phone]   If 7PM-7AM, please contact night-coverage [email or web address] Password [facility]  01/25/2016, 3:48 PM

## 2016-01-25 NOTE — ED Provider Notes (Signed)
[facility]-EMERGENCY DEPT Provider Note   CSN: [id number] Arrival date & time: 01/25/16  1307     History   Chief Complaint Chief Complaint  Patient presents with  . Shortness of Breath    HPI Kim [name] is a 75 y.o. female.  Pt presents to the ED today with sob.  The pt said that it has been going on for about 2 weeks.  She has a cough as well.  She called EMS today because she is getting worse.      Past Medical History:  Diagnosis Date  . Constipation due to pain medication   . Diabetes mellitus without complication ([facility])   . GERD (gastroesophageal reflux disease)   . Hyperlipemia   . Hypertension   . Postinflammatory pulmonary fibrosis ([facility])   . Seasonal allergies     Patient Active Problem List   Diagnosis Date Noted  . Acute respiratory failure with hypoxia ([facility]) 01/25/2016  . 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 Pure hypercholesterolemia 07/29/2012  . Acute pharyngitis 05/21/2012  . Diabetes mellitus type 2 in obese ([facility]) 01/16/2010  . HTN (hypertension) 01/16/2010  . Upper airway cough syndrome 01/12/2010    Past Surgical History:  Procedure Laterality Date  . ABDOMINAL HYSTERECTOMY    . COLONOSCOPY    . HERNIA REPAIR     X 2  . SMALL INTESTINE SURGERY    . TOTAL KNEE ARTHROPLASTY Right 07/06/2014   Procedure: RIGHT TOTAL KNEE ARTHROPLASTY;  Surgeon: Mckinley Jewel, MD;  Location: [facility] OR;  Service: Orthopedics;  Laterality: Right;    OB History    No data available       Home Medications    Prior to Admission medications   Medication Sig Start Date End Date Taking? Authorizing Provider  acetaminophen (TYLENOL) 500 MG tablet Take 500 mg by mouth every 6 (six) hours as needed for mild pain or moderate pain.   Yes Historical Provider, MD  aspirin EC 81 MG tablet Take 81 mg by mouth daily.   Yes Historical Provider, MD  famotidine (PEPCID) 20 MG tablet Take 1 tablet  (20 mg total) by mouth at bedtime. 12/20/15  Yes Nyoka Cowden, MD  guaiFENesin-codeine [facility]) 100-10 MG/5ML syrup Take 5-10 mLs by mouth every 8 (eight) hours as needed for cough.   Yes Historical Provider, MD  levothyroxine (SYNTHROID, LEVOTHROID) 50 MCG tablet Take 50 mcg by mouth daily before breakfast.   Yes Historical Provider, MD  losartan-hydrochlorothiazide (HYZAAR) 100-12.5 MG per tablet Take 1 tablet by mouth daily.   Yes Historical Provider, MD  megestrol (MEGACE) 40 MG/ML suspension Take 200 mg by mouth daily.   Yes Historical Provider, MD  metFORMIN (GLUCOPHAGE) 500 MG tablet TAKE 1 TABLET BY MOUTH TWICE A DAY 03/31/13  Yes Eulis Foster, FNP  pantoprazole (PROTONIX) 40 MG tablet Take 1 tablet (40 mg total) by mouth daily. Take 30-60 min before first meal of the day Patient not taking: Reported on 01/25/2016 09/21/15   Nyoka Cowden, MD    Family History Family History  Problem Relation Age of Onset  . Stomach cancer Sister   . Heart attack Sister     Social History Social History  Substance Use Topics  . Smoking status: Former Smoker    Packs/day: 0.50    Years: 10.00    Types: Cigarettes    Quit date: 02/26/1988  . Smokeless tobacco: Never Used  . Alcohol  use No     Allergies   Codeine and Penicillins   Review of Systems Review of Systems  Respiratory: Positive for shortness of breath.   All other systems reviewed and are negative.    Physical Exam Updated Vital Signs BP 115/70   Pulse 101   Temp 97.8 F (36.6 C) (Oral)   Resp 26   Ht 5\' 9"  (1.753 m)   Wt 129 lb (58.5 kg)   SpO2 94%   BMI 19.05 kg/m   Physical Exam  Constitutional: She is oriented to person, place, and time. She appears well-developed and well-nourished.  HENT:  Head: Normocephalic and atraumatic.  Right Ear: External ear normal.  Left Ear: External ear normal.  Nose: Nose normal.  Mouth/Throat: Oropharynx is clear and moist.  Eyes: Conjunctivae and EOM are normal.  Pupils are equal, round, and reactive to light.  Neck: Normal range of motion. Neck supple.  Cardiovascular: Normal heart sounds and intact distal pulses.  Tachycardia present.   Pulmonary/Chest: Breath sounds normal. Tachypnea noted.  Abdominal: Soft. Bowel sounds are normal.  Musculoskeletal: Normal range of motion.  Neurological: She is alert and oriented to person, place, and time.  Skin: Skin is warm.  Psychiatric: She has a normal mood and affect. Her behavior is normal. Judgment and thought content normal.  Nursing note and vitals reviewed.    ED Treatments / Results  Labs (all labs ordered are listed, but only abnormal results are displayed) Labs Reviewed  COMPREHENSIVE METABOLIC PANEL - Abnormal; Notable for the following:       Result Value   Glucose, Bld 115 (*)    Albumin 2.9 (*)    ALT 7 (*)    Total Bilirubin 0.2 (*)    GFR calc non Af Amer 59 (*)    All other components within normal limits  CBC WITH DIFFERENTIAL/PLATELET - Abnormal; Notable for the following:    WBC 10.8 (*)    RDW 15.6 (*)    Platelets 415 (*)    All other components within normal limits  TROPONIN I  BRAIN NATRIURETIC PEPTIDE  TSH  URINALYSIS, ROUTINE W REFLEX MICROSCOPIC (NOT AT [facility])  I-STAT ARTERIAL BLOOD GAS, ED  I-STAT CG4 LACTIC ACID, ED    EKG  EKG Interpretation  Date/Time:  Thursday January 25 2016 13:31:57 EST Ventricular Rate:  112 PR Interval:    QRS Duration: 90 QT Interval:  314 QTC Calculation: 429 R Axis:   -14 Text Interpretation:  Age not entered, assumed to be  75 years old for purpose of ECG interpretation Sinus tachycardia Anterior infarct, old Confirmed by [facility] MD, Deanda Ruddell [phone]) on 01/25/2016 2:34:34 PM       Radiology Dg Chest Port 1 View  Result Date: 01/25/2016 CLINICAL DATA:  Shortness of breath for 1 week EXAM: PORTABLE CHEST 1 VIEW COMPARISON:  10/25/2015 FINDINGS: Low volume chest with coarse interstitial opacities correlating with known  pulmonary fibrosis. There is generalized increase in interstitial opacity. No focal consolidation, effusion, or pneumothorax. Normal heart size. IMPRESSION: Pulmonary fibrosis. Generalized increase in interstitial opacity compared to 10/25/2015 may reflect progression of interstitial lung disease or acute process, including atypical infection. Electronically Signed   By: Marnee Spring M.D.   On: 01/25/2016 13:39    Procedures Procedures (including critical care time)  Medications Ordered in ED Medications  cefTRIAXone (ROCEPHIN) 1 g in dextrose 5 % 50 mL IVPB (not administered)  azithromycin (ZITHROMAX) 500 mg in dextrose 5 % 250 mL IVPB (not administered)  methylPREDNISolone sodium succinate (SOLU-MEDROL) 125 mg/2 mL injection 125 mg (125 mg Intravenous Given 01/25/16 1428)  iopamidol (ISOVUE-370) 76 % injection (100 mLs  Contrast Given 01/25/16 1541)     Initial Impression / Assessment and Plan / ED Course  I have reviewed the triage vital signs and the nursing notes.  Pertinent labs & imaging results that were available during my care of the patient were reviewed by me and considered in my medical decision making (see chart for details).  Clinical Course     Pt has improved with oxygen, albuterol, and solumedrol.  CT chest is pending.  Pt d/w hospitalist for admission.  Final Clinical Impressions(s) / ED Diagnoses   Final diagnoses:  Pulmonary fibrosis (HCC)  Hypoxia  Atypical pneumonia    New Prescriptions New Prescriptions   No medications on file     Jacalyn LefevreJulie Franck Vinal, MD 01/25/16 1547

## 2016-01-25 NOTE — ED Notes (Signed)
CBG 166 

## 2016-01-25 NOTE — ED Triage Notes (Signed)
Pt presents with 2 week h/o shortness of breath that worsened today.  Pt reports generalized malaise "for a while".  Pt reports cough is dry, tachycardia reported in route.

## 2016-01-25 NOTE — ED Notes (Signed)
Attempted report X2 

## 2016-01-26 ENCOUNTER — Observation Stay (HOSPITAL_BASED_OUTPATIENT_CLINIC_OR_DEPARTMENT_OTHER): Payer: Medicare Other

## 2016-01-26 DIAGNOSIS — J189 Pneumonia, unspecified organism: Secondary | ICD-10-CM | POA: Diagnosis present

## 2016-01-26 DIAGNOSIS — E1169 Type 2 diabetes mellitus with other specified complication: Secondary | ICD-10-CM | POA: Diagnosis not present

## 2016-01-26 DIAGNOSIS — Z7982 Long term (current) use of aspirin: Secondary | ICD-10-CM | POA: Diagnosis not present

## 2016-01-26 DIAGNOSIS — I5032 Chronic diastolic (congestive) heart failure: Secondary | ICD-10-CM | POA: Diagnosis present

## 2016-01-26 DIAGNOSIS — R0600 Dyspnea, unspecified: Secondary | ICD-10-CM

## 2016-01-26 DIAGNOSIS — Z79818 Long term (current) use of other agents affecting estrogen receptors and estrogen levels: Secondary | ICD-10-CM | POA: Diagnosis not present

## 2016-01-26 DIAGNOSIS — E669 Obesity, unspecified: Secondary | ICD-10-CM

## 2016-01-26 DIAGNOSIS — K219 Gastro-esophageal reflux disease without esophagitis: Secondary | ICD-10-CM | POA: Diagnosis present

## 2016-01-26 DIAGNOSIS — I10 Essential (primary) hypertension: Secondary | ICD-10-CM

## 2016-01-26 DIAGNOSIS — E860 Dehydration: Secondary | ICD-10-CM | POA: Diagnosis present

## 2016-01-26 DIAGNOSIS — I110 Hypertensive heart disease with heart failure: Secondary | ICD-10-CM | POA: Diagnosis present

## 2016-01-26 DIAGNOSIS — Z7984 Long term (current) use of oral hypoglycemic drugs: Secondary | ICD-10-CM | POA: Diagnosis not present

## 2016-01-26 DIAGNOSIS — Z87891 Personal history of nicotine dependence: Secondary | ICD-10-CM | POA: Diagnosis not present

## 2016-01-26 DIAGNOSIS — Z880 Allergy status to penicillin: Secondary | ICD-10-CM | POA: Diagnosis not present

## 2016-01-26 DIAGNOSIS — E119 Type 2 diabetes mellitus without complications: Secondary | ICD-10-CM | POA: Diagnosis present

## 2016-01-26 DIAGNOSIS — Z96651 Presence of right artificial knee joint: Secondary | ICD-10-CM | POA: Diagnosis present

## 2016-01-26 DIAGNOSIS — Z800 Family history of malignant neoplasm of digestive organs: Secondary | ICD-10-CM | POA: Diagnosis not present

## 2016-01-26 DIAGNOSIS — E7800 Pure hypercholesterolemia, unspecified: Secondary | ICD-10-CM | POA: Diagnosis present

## 2016-01-26 DIAGNOSIS — E890 Postprocedural hypothyroidism: Secondary | ICD-10-CM | POA: Diagnosis present

## 2016-01-26 DIAGNOSIS — J9601 Acute respiratory failure with hypoxia: Secondary | ICD-10-CM | POA: Diagnosis present

## 2016-01-26 DIAGNOSIS — J8410 Pulmonary fibrosis, unspecified: Secondary | ICD-10-CM | POA: Diagnosis present

## 2016-01-26 DIAGNOSIS — Z8249 Family history of ischemic heart disease and other diseases of the circulatory system: Secondary | ICD-10-CM | POA: Diagnosis not present

## 2016-01-26 DIAGNOSIS — Z79899 Other long term (current) drug therapy: Secondary | ICD-10-CM | POA: Diagnosis not present

## 2016-01-26 LAB — RESPIRATORY PANEL BY PCR
Adenovirus: NOT DETECTED
Bordetella pertussis: NOT DETECTED
CORONAVIRUS OC43-RVPPCR: NOT DETECTED
Chlamydophila pneumoniae: NOT DETECTED
Coronavirus 229E: NOT DETECTED
Coronavirus HKU1: NOT DETECTED
Coronavirus NL63: NOT DETECTED
INFLUENZA A-RVPPCR: NOT DETECTED
INFLUENZA B-RVPPCR: NOT DETECTED
METAPNEUMOVIRUS-RVPPCR: NOT DETECTED
Mycoplasma pneumoniae: NOT DETECTED
PARAINFLUENZA VIRUS 1-RVPPCR: NOT DETECTED
PARAINFLUENZA VIRUS 2-RVPPCR: NOT DETECTED
PARAINFLUENZA VIRUS 4-RVPPCR: NOT DETECTED
Parainfluenza Virus 3: NOT DETECTED
RESPIRATORY SYNCYTIAL VIRUS-RVPPCR: NOT DETECTED
Rhinovirus / Enterovirus: NOT DETECTED

## 2016-01-26 LAB — COMPREHENSIVE METABOLIC PANEL
ALBUMIN: 2.7 g/dL — AB (ref 3.5–5.0)
ALT: 9 U/L — AB (ref 14–54)
ANION GAP: 13 (ref 5–15)
AST: 19 U/L (ref 15–41)
Alkaline Phosphatase: 50 U/L (ref 38–126)
BUN: 17 mg/dL (ref 6–20)
CHLORIDE: 103 mmol/L (ref 101–111)
CO2: 20 mmol/L — AB (ref 22–32)
CREATININE: 1.02 mg/dL — AB (ref 0.44–1.00)
Calcium: 9.1 mg/dL (ref 8.9–10.3)
GFR calc non Af Amer: 53 mL/min — ABNORMAL LOW (ref >60)
GLUCOSE: 219 mg/dL — AB (ref 65–99)
Potassium: 3.6 mmol/L (ref 3.5–5.1)
SODIUM: 136 mmol/L (ref 135–145)
Total Bilirubin: <0.1 mg/dL — ABNORMAL LOW (ref 0.3–1.2)
Total Protein: 6.8 g/dL (ref 6.5–8.1)

## 2016-01-26 LAB — CBC
HCT: 34.3 % — ABNORMAL LOW (ref 36.0–46.0)
Hemoglobin: 11.3 g/dL — ABNORMAL LOW (ref 12.0–15.0)
MCH: 26.7 pg (ref 26.0–34.0)
MCHC: 32.9 g/dL (ref 30.0–36.0)
MCV: 80.9 fL (ref 78.0–100.0)
PLATELETS: 411 10*3/uL — AB (ref 150–400)
RBC: 4.24 MIL/uL (ref 3.87–5.11)
RDW: 15 % (ref 11.5–15.5)
WBC: 7.6 10*3/uL (ref 4.0–10.5)

## 2016-01-26 LAB — HEMOGLOBIN A1C
HEMOGLOBIN A1C: 6 % — AB (ref 4.8–5.6)
MEAN PLASMA GLUCOSE: 126 mg/dL

## 2016-01-26 LAB — GLUCOSE, CAPILLARY
GLUCOSE-CAPILLARY: 154 mg/dL — AB (ref 65–99)
Glucose-Capillary: 244 mg/dL — ABNORMAL HIGH (ref 65–99)
Glucose-Capillary: 294 mg/dL — ABNORMAL HIGH (ref 65–99)
Glucose-Capillary: 156 mg/dL — ABNORMAL HIGH (ref 65–99)
Glucose-Capillary: 349 mg/dL — ABNORMAL HIGH (ref 65–99)

## 2016-01-26 LAB — HIV ANTIBODY (ROUTINE TESTING W REFLEX): HIV Screen 4th Generation wRfx: NONREACTIVE

## 2016-01-26 LAB — ECHOCARDIOGRAM COMPLETE
Height: 70.8 in
Weight: 2177 oz

## 2016-01-26 LAB — STREP PNEUMONIAE URINARY ANTIGEN: Strep Pneumo Urinary Antigen: NEGATIVE

## 2016-01-26 MED ORDER — IPRATROPIUM-ALBUTEROL 0.5-2.5 (3) MG/3ML IN SOLN
3.0000 mL | Freq: Three times a day (TID) | RESPIRATORY_TRACT | Status: DC
  Administered 2016-01-26 – 2016-01-28 (×5): 3 mL via RESPIRATORY_TRACT
  Filled 2016-01-26 (×5): qty 3

## 2016-01-26 MED ORDER — ALBUTEROL SULFATE (2.5 MG/3ML) 0.083% IN NEBU: 2.5000 mg | INHALATION_SOLUTION | RESPIRATORY_TRACT | Status: DC | PRN

## 2016-01-26 MED ORDER — GUAIFENESIN-DM 100-10 MG/5ML PO SYRP
5.0000 mL | ORAL_SOLUTION | ORAL | Status: DC | PRN
  Administered 2016-01-26 – 2016-01-27 (×3): 5 mL via ORAL
  Filled 2016-01-26 (×3): qty 5

## 2016-01-26 NOTE — Progress Notes (Signed)
PROGRESS NOTE    MANDY PEEKS  NHA:[id number] DOB: Apr 06, 1940 DOA: 01/25/2016 PCP: Elyn Peers, MD   Outpatient Specialists:     Brief Narrative:  Kim [name] is a 75 y.o. female with medical history significant for diabetes on metformin, hypertension, dyslipidemia, and hypothyroidism. He has a history of childhood asthma and developed worsening cough and dyspnea symptoms beginning in 2015 for which she was referred to pulmonology/Dr. Annamaria Boots and Melvyn Novas. She has had abnormal chest x-ray finding since 2006 concern for pulmonary fibrosis. Prior CT of the chest compatible with interstitial lung disease. She has experienced documented decrease in pulse oximetry with ambulation. CT of the chest in July 2017 consistent with UIP with max GERD oral therapy for 6 weeks. She returns today with reports of 2 weeks of shortness of breath/DOE (worsened today) with generalized malaise ongoing for at least 1 month. This is been associated with a dry cough and apparent increased heart rate prior to arrival. Upon arrival to the ER her room air saturations were 86% with a heart rate of 108 and respiratory rate of 40. Oxygen was applied with improvement of sats to 94% and decrease in respiratory rate 26 breaths per minute. ABG on oxygen with PCO2 36 PO2 106. Chest x-ray demonstrating either progression of pulmonary fibrosis versus acute process such as atypical infection therefore ER has treated as atypical presentation of community-acquired pneumonia. CT of the chest has been ordered by EDP to rule out possible PE as well.   Assessment & Plan:   Principal Problem:   Acute respiratory failure with hypoxia (HCC) Active Problems:   Diabetes mellitus type 2 in obese (HCC)   HTN (hypertension)   Pure hypercholesterolemia   Postinflammatory pulmonary fibrosis (history of)   Hypothyroidism   Atypical pneumonia   Acute respiratory failure with hypoxia -acute hypoxemia and history of known postinflammatory  pulmonary fibrosis  - CT angiogram of the chest to rule out PE shows: Interstitial lung disease consistent with usual interstitial pneumonia. New ground-glass opacity in the left upper and lower lobes is nonspecific but may reflect superimposed pneumonia (including atypical agents), pulmonary hemorrhage, progression of chronic interstitial disease, and less likely asymmetric edema.  Stable to slightly increased mediastinal and hilar lymphadenopathy, likely reactive. -Continue supportive care with oxygen -Treat underlying causes  :   Postinflammatory pulmonary fibrosis (history of) -Has been evaluated by pulmonary and outpatient setting and treated with high-dose PPI for 6 weeks -Echocardiogram- pending -ESR: 4.7 and CRP:60 -Solu-Medrol 60 mg IV every 6 hours -Continue PPI and H2 blocker   ? Atypical pneumonia -Has mild leukocytosis with normal differential -Radiologist questioning atypical presentation of pneumonia -Procalcitonin low -Respiratory viral panel negative -Empiric antibiotics to cover for atypicals utilizing pneumonia order set -blood cultures, sputum culture, urinary strep and Legionella: pending   Mild dehydration -Hold preadmission thiazide diuretic -CK low U/a negative for infection    Diabetes mellitus type 2 in obese  -Hold preadmission metformin in setting of acute illness and receipt of IV contrast -Hemoglobin A1c -SSI (sensitive scale)    HTN (hypertension) -continue losartan since renal function is otherwise normal    Pure hypercholesterolemia -Not on medical therapy prior to admission    Hypothyroidism -Continue Synthroid -TSH normal -NM thyroid uptake in March 2017 revealed findings consistent with Graves' disease-?? Has undergone RAI treatment      DVT prophylaxis:  Lovenox   Code Status: Full Code   Family Communication:   Disposition Plan:    Consultants:  Subjective: Breathing has  improved  Objective: Vitals:   01/26/16 0147 01/26/16 0340 01/26/16 0500 01/26/16 0944  BP:  113/60  (!) 134/55  Pulse: 70 95  89  Resp: 20 20  20   Temp:  97.7 F (36.5 C)    TempSrc:  Oral    SpO2: 96% 100%  100%  Weight:   61.7 kg (136 lb 1 oz)   Height:        Intake/Output Summary (Last 24 hours) at 01/26/16 1011 Last data filed at 01/26/16 0959  Gross per 24 hour  Intake              930 ml  Output              200 ml  Net              730 ml   Filed Weights   01/25/16 1319 01/25/16 2055 01/26/16 0500  Weight: 58.5 kg (129 lb) 60.3 kg (132 lb 14.4 oz) 61.7 kg (136 lb 1 oz)    Examination:  General exam: Appears calm and comfortable  Respiratory system: no wheezing, crackles b/l- no increased work of breathing Cardiovascular system: S1 & S2 heard, RRR. No JVD, murmurs, rubs, gallops or clicks. No pedal edema. Gastrointestinal system: Abdomen is nondistended, soft and nontender. No organomegaly or masses felt. Normal bowel sounds heard. Central nervous system: Alert and oriented. No focal neurological deficits.    Data Reviewed: I have personally reviewed following labs and imaging studies  CBC:  Recent Labs Lab 01/25/16 1341 01/26/16 0351  WBC 10.8* 7.6  NEUTROABS 7.1  --   HGB 13.2 11.3*  HCT 39.4 34.3*  MCV 80.9 80.9  PLT 415* 498*   Basic Metabolic Panel:  Recent Labs Lab 01/25/16 1341 01/25/16 2128 01/26/16 0351  NA 136  --  136  K 3.9  --  3.6  CL 105  --  103  CO2 23  --  20*  GLUCOSE 115*  --  219*  BUN 13  --  17  CREATININE 0.93  --  1.02*  CALCIUM 9.4  --  9.1  MG  --  1.8  --   PHOS  --  3.1  --    GFR: Estimated Creatinine Clearance: 47.1 mL/min (by C-G formula based on SCr of 1.02 mg/dL (H)). Liver Function Tests:  Recent Labs Lab 01/25/16 1341 01/26/16 0351  AST 16 19  ALT 7* 9*  ALKPHOS 58 50  BILITOT 0.2* <0.1*  PROT 7.5 6.8  ALBUMIN 2.9* 2.7*   No results for input(s): LIPASE, AMYLASE in the last 168 hours. No  results for input(s): AMMONIA in the last 168 hours. Coagulation Profile: No results for input(s): INR, PROTIME in the last 168 hours. Cardiac Enzymes:  Recent Labs Lab 01/25/16 1341 01/25/16 1625  CKTOTAL  --  20*  TROPONINI <0.03  --    BNP (last 3 results) No results for input(s): PROBNP in the last 8760 hours. HbA1C: No results for input(s): HGBA1C in the last 72 hours. CBG:  Recent Labs Lab 01/25/16 1752 01/25/16 1939 01/26/16 0815  GLUCAP 208* 166* 294*   Lipid Profile: No results for input(s): CHOL, HDL, LDLCALC, TRIG, CHOLHDL, LDLDIRECT in the last 72 hours. Thyroid Function Tests:  Recent Labs  01/25/16 1341  TSH 2.040   Anemia Panel: No results for input(s): VITAMINB12, FOLATE, FERRITIN, TIBC, IRON, RETICCTPCT in the last 72 hours. Urine analysis:    Component Value Date/Time   COLORURINE  YELLOW 01/25/2016 [place] 01/25/2016 1659   LABSPEC 1.025 01/25/2016 1659   PHURINE 5.0 01/25/2016 1659   GLUCOSEU NEGATIVE 01/25/2016 1659   HGBUR NEGATIVE 01/25/2016 [place] 01/25/2016 1659   KETONESUR NEGATIVE 01/25/2016 1659   PROTEINUR NEGATIVE 01/25/2016 1659   UROBILINOGEN 0.2 10/04/2014 0100   NITRITE NEGATIVE 01/25/2016 [place] 01/25/2016 1659    ) Recent Results (from the past 240 hour(s))  Respiratory Panel by PCR     Status: None   Collection Time: 01/25/16  9:50 PM  Result Value Ref Range Status   Adenovirus NOT DETECTED NOT DETECTED Final   Coronavirus 229E NOT DETECTED NOT DETECTED Final   Coronavirus HKU1 NOT DETECTED NOT DETECTED Final   Coronavirus NL63 NOT DETECTED NOT DETECTED Final   Coronavirus OC43 NOT DETECTED NOT DETECTED Final   Metapneumovirus NOT DETECTED NOT DETECTED Final   Rhinovirus / Enterovirus NOT DETECTED NOT DETECTED Final   Influenza A NOT DETECTED NOT DETECTED Final   Influenza B NOT DETECTED NOT DETECTED Final   Parainfluenza Virus 1 NOT DETECTED NOT DETECTED  Final   Parainfluenza Virus 2 NOT DETECTED NOT DETECTED Final   Parainfluenza Virus 3 NOT DETECTED NOT DETECTED Final   Parainfluenza Virus 4 NOT DETECTED NOT DETECTED Final   Respiratory Syncytial Virus NOT DETECTED NOT DETECTED Final   Bordetella pertussis NOT DETECTED NOT DETECTED Final   Chlamydophila pneumoniae NOT DETECTED NOT DETECTED Final   Mycoplasma pneumoniae NOT DETECTED NOT DETECTED Final      Anti-infectives    Start     Dose/Rate Route Frequency Ordered Stop   01/26/16 2000  levofloxacin (LEVAQUIN) IVPB 500 mg     500 mg 100 mL/hr over 60 Minutes Intravenous Every 24 hours 01/25/16 2106     01/25/16 1530  cefTRIAXone (ROCEPHIN) 1 g in dextrose 5 % 50 mL IVPB     1 g 100 mL/hr over 30 Minutes Intravenous  Once 01/25/16 1525 01/25/16 1804   01/25/16 1530  azithromycin (ZITHROMAX) 500 mg in dextrose 5 % 250 mL IVPB     500 mg 250 mL/hr over 60 Minutes Intravenous  Once 01/25/16 1525 01/25/16 2044       Radiology Studies: Ct Angio Chest Pe W And/or Wo Contrast  Result Date: 01/25/2016 CLINICAL DATA:  Shortness of breath for 1 week, worsening. EXAM: CT ANGIOGRAPHY CHEST WITH CONTRAST TECHNIQUE: Multidetector CT imaging of the chest was performed using the standard protocol during bolus administration of intravenous contrast. Multiplanar CT image reconstructions and MIPs were obtained to evaluate the vascular anatomy. CONTRAST:  100 mL Isovue 370 COMPARISON:  09/05/2015 chest CT FINDINGS: Cardiovascular: Pulmonary arterial opacification is adequate to the submental level without evidence of embolus. Thoracic aortic atherosclerosis is noted without aneurysm. The heart is normal in size. Left main, left anterior descending, and right coronary artery atherosclerotic calcification. No pericardial effusion. Mediastinum/Nodes: Multiple enlarged mediastinal and hilar lymph nodes are again seen and are stable to minimally increased in size compared to the prior study. The largest  measures 1.5 cm in short axis in the right paratracheal station. The thyroid, trachea, and esophagus are unremarkable. Lungs/Pleura: Extensive subpleural reticulation is again seen with traction bronchiectasis and honeycombing with a clear gradient with greatest changes in the bases. These changes are similar to the prior CT allowing for motion artifact on both studies (Greater on the prior examination), however there are new areas of confluent ground-glass opacity in the left upper  lobe, superior segment of the left lower lobe, and potentially in the more basilar aspects of both lower lobes though evaluation is limited by motion. Upper Abdomen: Unremarkable. Musculoskeletal: No acute osseous abnormality or suspicious osseous lesion identified. Review of the MIP images confirms the above findings. IMPRESSION: 1. Interstitial lung disease consistent with usual interstitial pneumonia. New ground-glass opacity in the left upper and lower lobes is nonspecific but may reflect superimposed pneumonia (including atypical agents), pulmonary hemorrhage, progression of chronic interstitial disease, and less likely asymmetric edema. 2. Stable to slightly increased mediastinal and hilar lymphadenopathy, likely reactive. 3. Aortic atherosclerosis.  Coronary artery atherosclerosis. Electronically Signed   By: Logan Bores M.D.   On: 01/25/2016 16:26   Dg Chest Port 1 View  Result Date: 01/25/2016 CLINICAL DATA:  Shortness of breath for 1 week EXAM: PORTABLE CHEST 1 VIEW COMPARISON:  10/25/2015 FINDINGS: Low volume chest with coarse interstitial opacities correlating with known pulmonary fibrosis. There is generalized increase in interstitial opacity. No focal consolidation, effusion, or pneumothorax. Normal heart size. IMPRESSION: Pulmonary fibrosis. Generalized increase in interstitial opacity compared to 10/25/2015 may reflect progression of interstitial lung disease or acute process, including atypical infection.  Electronically Signed   By: Monte Fantasia M.D.   On: 01/25/2016 13:39        Scheduled Meds: . aspirin EC  81 mg Oral Daily  . enoxaparin (LOVENOX) injection  40 mg Subcutaneous Q24H  . famotidine  20 mg Oral QHS  . insulin aspart  0-5 Units Subcutaneous QHS  . insulin aspart  0-9 Units Subcutaneous TID WC  . ipratropium-albuterol  3 mL Nebulization Q6H  . levofloxacin (LEVAQUIN) IV  500 mg Intravenous Q24H  . levothyroxine  50 mcg Oral QAC breakfast  . losartan  100 mg Oral Daily  . mouth rinse  15 mL Mouth Rinse BID  . megestrol  200 mg Oral Daily  . methylPREDNISolone (SOLU-MEDROL) injection  60 mg Intravenous Q6H   Continuous Infusions:   LOS: 0 days    Time spent: 35 min    [place], DO [facility] Pager [phone]  If 7PM-7AM, please contact night-coverage [email or web address] Password [email or web address] 01/26/2016, 10:11 AM

## 2016-01-26 NOTE — Evaluation (Signed)
Physical Therapy Evaluation Patient Details Name: Kim [name] MRN: [id number] DOB: February 17, 1941 Today's Date: 01/26/2016   History of Present Illness  75 y.o. female with a Past Medical History significant for hypertension, low thyroid and diabetes who presents with dyspnea. +pna    Clinical Impression  Pt admitted with above diagnosis. Patient currently very deconditioned with significant dyspnea, elevated HR (133), and decr SaO2 (85%) with ambulating 20 ft. She states her daughter is out of work and can come stay with her. Incr risk of falls if patient goes home on home oxygen. Pt currently with functional limitations due to the deficits listed below (see PT Problem List).  Pt will benefit from skilled PT to increase their independence and safety with mobility to allow discharge to the venue listed below.       Follow Up Recommendations Home health PT;Supervision/Assistance - 24 hour (? If daughter can provide)     Equipment Recommendations  None recommended by PT    Recommendations for Other Services OT consult     Precautions / Restrictions Precautions Precautions: Fall Precaution Comments: desaturates      Mobility  Bed Mobility Overal bed mobility: Independent                Transfers Overall transfer level: Needs assistance Equipment used: None Transfers: Sit to/from Stand Sit to Stand: Min guard         General transfer comment: for safety on room air  Ambulation/Gait Ambulation/Gait assistance: Min guard Ambulation Distance (Feet): 10 Feet (toileted; 20) Assistive device: None Gait Pattern/deviations: Step-through pattern;Decreased stride length;Trunk flexed Gait velocity: decr   General Gait Details: no imbalance noted; not reaching for UE support  Stairs            Wheelchair Mobility    Modified Rankin (Stroke Patients Only)       Balance Overall balance assessment: Needs assistance Sitting-balance support: No upper extremity  supported;Feet supported Sitting balance-Leahy Scale: Good     Standing balance support: No upper extremity supported Standing balance-Leahy Scale: Good                               Pertinent Vitals/Pain Pain Assessment: No/denies pain    Home Living Family/patient expects to be discharged to:: Private residence Living Arrangements: Alone Available Help at Discharge: Family;Available 24 hours/day (reports dtr out of work and can stay with her) Type of Home: House Home Access: Stairs to enter Entrance Stairs-Rails: Right Entrance Stairs-Number of Steps: 4 Home Layout: One level Home Equipment: Cane - single point      Prior Function Level of Independence: Needs assistance   Gait / Transfers Assistance Needed: needs assist on stairs; walks indoors independently  ADL's / Homemaking Assistance Needed: daughters do all cooking and cleaning; does not drive        Hand Dominance        Extremity/Trunk Assessment   Upper Extremity Assessment: Generalized weakness           Lower Extremity Assessment: Generalized weakness      Cervical / Trunk Assessment: Kyphotic  Communication   Communication: No difficulties  Cognition Arousal/Alertness: Awake/alert Behavior During Therapy: WFL for tasks assessed/performed Overall Cognitive Status: Within Functional Limits for tasks assessed                      General Comments      Exercises     Assessment/Plan  PT Assessment Patient needs continued PT services  PT Problem List Decreased strength;Decreased activity tolerance;Decreased mobility;Decreased knowledge of use of DME;Cardiopulmonary status limiting activity          PT Treatment Interventions DME instruction;Gait training;Stair training;Functional mobility training;Therapeutic activities;Therapeutic exercise;Patient/family education    PT Goals (Current goals can be found in the Care Plan section)  Acute Rehab PT Goals Patient  Stated Goal: not need home O2 PT Goal Formulation: With patient Time For Goal Achievement: 02/02/16 Potential to Achieve Goals: Good    Frequency Min 3X/week   Barriers to discharge Decreased caregiver support patient lives alone; pt states dtr can stay with her    Co-evaluation               End of Session Equipment Utilized During Treatment: Gait belt;Oxygen Activity Tolerance: Treatment limited secondary to medical complications (Comment) (elevated HR and decr SaO2) Patient left: in chair;with call bell/phone within reach;with chair alarm set      Functional Assessment Tool Used: clinical judgement Functional Limitation: Mobility: Walking and moving around Mobility: Walking and Moving Around Current Status (G2952(G8978): At least 1 percent but less than 20 percent impaired, limited or restricted Mobility: Walking and Moving Around Goal Status [phone](G8979): At least 1 percent but less than 20 percent impaired, limited or restricted    Time: 1452-1521 PT Time Calculation (min) (ACUTE ONLY): 29 min   Charges:   PT Evaluation $PT Eval Moderate Complexity: 1 Procedure     PT G Codes:   PT G-Codes **NOT FOR INPATIENT CLASS** Functional Assessment Tool Used: clinical judgement Functional Limitation: Mobility: Walking and moving around Mobility: Walking and Moving Around Current Status (G4010(G8978): At least 1 percent but less than 20 percent impaired, limited or restricted Mobility: Walking and Moving Around Goal Status [phone](G8979): At least 1 percent but less than 20 percent impaired, limited or restricted    Zena AmosLynn P Marice Guidone 01/26/2016, 3:32 PM Pager [phone]

## 2016-01-26 NOTE — Progress Notes (Signed)
*  PRELIMINARY RESULTS* Echocardiogram 2D Echocardiogram has been performed.  Jeryl Columbialliott, Oluwatimileyin Vivier 01/26/2016, 9:51 AM

## 2016-01-26 NOTE — Progress Notes (Signed)
NURSING PROGRESS NOTE  Kim Amorommie L GriffinMRN: [id number] Admission Data: 01/25/16 at 9PM Attending Provider: Haydee SalterPhillip M Hobbs, MD PCP: Geraldo PitterBLAND,VEITA J, MD Code status: Full  Allergies:  Allergies  Allergen Reactions  . Codeine Nausea And Vomiting    "Takes my appetite"  . Penicillins Rash    Has patient had a PCN reaction causing immediate rash, facial/tongue/throat swelling, SOB or lightheadedness with hypotension: Yes Has patient had a PCN reaction causing severe rash involving mucus membranes or skin necrosis: No Has patient had a PCN reaction that required hospitalization No Has patient had a PCN reaction occurring within the last 10 years: No If all of the above answers are "NO", then may proceed with Cephalosporin use.    Past Medical History:  Past Medical History:  Diagnosis Date  . Constipation due to pain medication   . Diabetes mellitus without complication (HCC)   . GERD (gastroesophageal reflux disease)   . Hyperlipemia   . Hypertension   . Postinflammatory pulmonary fibrosis (HCC)   . Seasonal allergies    Past Surgical History:  Past Surgical History:  Procedure Laterality Date  . ABDOMINAL HYSTERECTOMY    . COLONOSCOPY    . HERNIA REPAIR     X 2  . SMALL INTESTINE SURGERY    . 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 female patient, arrived to floor in room [id number] via stretcher, transferred from ED. Patient alert and oriented X 4. No acute distress noted. Denies pain.  Vital signs: Oral temperature 97.5 F (36.4 C), Blood pressure 142/65, Pulse 121, RR 28, SpO2 99 % on room air. Height 5'10", weight 132 lbs (60.28 kg).   Cardiac monitoring: Telemetry box 5W # 27 in place. Second verified by Harlow AsaErica O, Nurse Tech  IV access: Right AC; condition patent and no redness.  Skin: intact, no  pressure ulcer noted in sacral area. Second verified by Emeline GeneralKelli Jones,RN  Patient's ID armband verified with patient and in place. Information packet given to patient. Fall risk assessed, SR up X2, patient able to verbalize understanding of risks associated with falls and to call nurse or staff to assist before getting out of bed. Patient oriented to room and equipment. Call bell within reach.

## 2016-01-26 NOTE — Care Management Obs Status (Signed)
MEDICARE OBSERVATION STATUS NOTIFICATION   Patient Details  Name: Kim [name] MRN: [id number] Date of Birth: 08-04-1940   Medicare Observation Status Notification Given:  Yes    Epifanio LeschesCole, Wilhelmine Krogstad Hudson, RN 01/26/2016, 2:53 PM

## 2016-01-26 NOTE — Progress Notes (Signed)
SATURATION QUALIFICATIONS: (This note is used to comply with regulatory documentation for home oxygen)  Patient Saturations on Room Air at Rest = 93%  Patient Saturations on Room Air while Ambulating = 85%  Patient Saturations on 4 Liters of oxygen while Ambulating = 90%  Please briefly explain why patient needs home oxygen: To maintain SaO2>88%   01/26/2016 Veda CanningLynn Dorea Duff, PT Pager: [phone]

## 2016-01-27 LAB — LEGIONELLA PNEUMOPHILA SEROGP 1 UR AG: L. pneumophila Serogp 1 Ur Ag: NEGATIVE

## 2016-01-27 LAB — GLUCOSE, CAPILLARY
GLUCOSE-CAPILLARY: 160 mg/dL — AB (ref 65–99)
GLUCOSE-CAPILLARY: 240 mg/dL — AB (ref 65–99)
Glucose-Capillary: 182 mg/dL — ABNORMAL HIGH (ref 65–99)
Glucose-Capillary: 128 mg/dL — ABNORMAL HIGH (ref 65–99)

## 2016-01-27 LAB — URINE CULTURE

## 2016-01-27 LAB — PROCALCITONIN

## 2016-01-27 MED ORDER — LEVOFLOXACIN 500 MG PO TABS
500.0000 mg | ORAL_TABLET | Freq: Every day | ORAL | Status: DC
  Administered 2016-01-27 – 2016-01-28 (×2): 500 mg via ORAL
  Filled 2016-01-27 (×2): qty 1

## 2016-01-27 NOTE — Progress Notes (Addendum)
SATURATION QUALIFICATIONS: (This note is used to comply with regulatory documentation for home oxygen)  Patient Saturations on Room Air at Rest = 95%  Patient Saturations on Room Air while Ambulating = 86%  Patient Saturations on 2 Liters of oxygen while Ambulating = 91%  Please briefly explain why patient needs home oxygen: Patient needs oxygen while ambulating

## 2016-01-27 NOTE — Progress Notes (Addendum)
PROGRESS NOTE    Kim [name]  YBO:[id number] DOB: 1940-07-20 DOA: 01/25/2016 PCP: Elyn Peers, MD     Brief Narrative:  Kim [name] a 75 y.o.femalewith medical history significant fordiabetes on metformin, hypertension, dyslipidemia,and hypothyroidism. He has a history of childhood asthma and developed worsening cough and dyspnea symptoms beginning in 2015 for which she was referred to pulmonology/Dr. Murvin Natal. She has had abnormal chest x-ray finding since 2006 concern for pulmonary fibrosis. Prior CT of the chest compatible with interstitial lung disease.She has experienced documented decrease in pulse oximetry with ambulation. CT of the chest in July 2017 consistent with UIP with max GERDoral therapy for 6 weeks. She returns with reports of 2 weeks of shortness of breath/DOE with generalized malaise ongoing for at least 1 month. This is been associated with a dry cough and apparent increased heart rate prior to arrival. Upon arrival to the ER her room air saturations were 86% with a heart rate of 108 and respiratory rate of 40. Oxygen was applied with improvement of sats to 94% and decrease in respiratory rate 26 breathsper minute. ABG on oxygen with PCO2 36 PO2 106. Chest x-ray demonstrating either progression of pulmonary fibrosis versus acute process such as atypical infection therefore ER has treated as atypical presentation of community-acquired pneumonia. CT of the chest has been ordered by EDP to rule out possible PE as well.  Assessment & Plan:   Principal Problem:   Acute respiratory failure with hypoxia (HCC) Active Problems:   Diabetes mellitus type 2 in obese (HCC)   HTN (hypertension)   Pure hypercholesterolemia   Postinflammatory pulmonary fibrosis (history of)   Hypothyroidism   Atypical pneumonia  Acute respiratory failure with hypoxemia -Due to ILD with ?pneumonia -[place] O2 to maintain sat > 90%  -May require O2 at time of discharge   Chronic  interstitial lung disease -Follows with Dr. Melvyn Novas (pulm). Last visit 09/21/2015 which Dr. Melvyn Novas felt it was very unlikely to be classic UIP but more with remote ALI  -ESR: 4.7 and CRP:60 -Continue Solu-Medrol 60 mg IV every 6 hours, switch to oral tomorrow   -Continue PPI and H2 blocker  ? Atypical pneumonia -Radiologist questioning atypical presentation of pneumonia due to new ground-glass opacity in left upper, left lower lobes  -Procalcitonin low -Respiratory viral panel negative -Strep pneumo Ag negative -Legionella Ag pending  -Respiratory culture pending -Blood culture pending  -Continue empiric levaquin, switch to oral today   DM type 2 -Ha1c 6.0 -Hold metformin -SSI  HTN -Continue losartan  Hypothyroidism, hx Grave's disease s/p RAI treatment  -Continue synthroid   Chronic diastolic heart failure -Echo 12/1: EF 27-78%, grade 1 diastolic dysfunction   DVT prophylaxis: lovenox Code Status: Full Family Communication: daughters at bedside Disposition Plan: pending further improvement. Home health PT    Consultants:   None  Procedures:   None  Antimicrobials:   Rocephin, Azithromax 11/30   Levaquin 12/1 >>   Subjective: Patient states that her breathing is much better since admission. She continues to have productive cough, shortness of breath, and requiring [place] O2 which is new for her. No Cp, fevers, N/V.   Objective: Vitals:   01/26/16 2102 01/26/16 2133 01/27/16 0513 01/27/16 0922  BP:  (!) 95/49 116/61   Pulse: 74 79 72   Resp: 20 18 18    Temp:  97.5 F (36.4 C) 98.2 F (36.8 C)   TempSrc:  Oral Oral   SpO2: 94% 100% 99% 97%  Weight:  60.9 kg (134 lb 4.8 oz)   Height:        Intake/Output Summary (Last 24 hours) at 01/27/16 0950 Last data filed at 01/27/16 0947  Gross per 24 hour  Intake              820 ml  Output              350 ml  Net              470 ml   Filed Weights   01/25/16 2055 01/26/16 0500 01/27/16 0513  Weight: 60.3 kg  (132 lb 14.4 oz) 61.7 kg (136 lb 1 oz) 60.9 kg (134 lb 4.8 oz)    Examination:  General exam: Appears calm and comfortable  Respiratory system: Crackles diffusely, no wheeze, on [place] O2, no resp distress  Cardiovascular system: S1 & S2 heard, RRR. No JVD, murmurs, rubs, gallops or clicks. No pedal edema. Gastrointestinal system: Abdomen is nondistended, soft and nontender. No organomegaly or masses felt. Normal bowel sounds heard. Central nervous system: Alert and oriented. No focal neurological deficits. Extremities: Symmetric 5 x 5 power. Skin: No rashes, lesions or ulcers Psychiatry: Judgement and insight appear normal. Mood & affect appropriate.   Data Reviewed: I have personally reviewed following labs and imaging studies  CBC:  Recent Labs Lab 01/25/16 1341 01/26/16 0351  WBC 10.8* 7.6  NEUTROABS 7.1  --   HGB 13.2 11.3*  HCT 39.4 34.3*  MCV 80.9 80.9  PLT 415* 741*   Basic Metabolic Panel:  Recent Labs Lab 01/25/16 1341 01/25/16 2128 01/26/16 0351  NA 136  --  136  K 3.9  --  3.6  CL 105  --  103  CO2 23  --  20*  GLUCOSE 115*  --  219*  BUN 13  --  17  CREATININE 0.93  --  1.02*  CALCIUM 9.4  --  9.1  MG  --  1.8  --   PHOS  --  3.1  --    GFR: Estimated Creatinine Clearance: 46.5 mL/min (by C-G formula based on SCr of 1.02 mg/dL (H)). Liver Function Tests:  Recent Labs Lab 01/25/16 1341 01/26/16 0351  AST 16 19  ALT 7* 9*  ALKPHOS 58 50  BILITOT 0.2* <0.1*  PROT 7.5 6.8  ALBUMIN 2.9* 2.7*   No results for input(s): LIPASE, AMYLASE in the last 168 hours. No results for input(s): AMMONIA in the last 168 hours. Coagulation Profile: No results for input(s): INR, PROTIME in the last 168 hours. Cardiac Enzymes:  Recent Labs Lab 01/25/16 1341 01/25/16 1625  CKTOTAL  --  20*  TROPONINI <0.03  --    BNP (last 3 results) No results for input(s): PROBNP in the last 8760 hours. HbA1C:  Recent Labs  01/25/16 2128  HGBA1C 6.0*    CBG:  Recent Labs Lab 01/26/16 0815 01/26/16 1143 01/26/16 1643 01/26/16 2046 01/27/16 0814  GLUCAP 294* 156* 349* 154* 160*   Lipid Profile: No results for input(s): CHOL, HDL, LDLCALC, TRIG, CHOLHDL, LDLDIRECT in the last 72 hours. Thyroid Function Tests:  Recent Labs  01/25/16 1341  TSH 2.040   Anemia Panel: No results for input(s): VITAMINB12, FOLATE, FERRITIN, TIBC, IRON, RETICCTPCT in the last 72 hours. Sepsis Labs:  Recent Labs Lab 01/25/16 1544 01/25/16 1625 01/27/16 0432  PROCALCITON  --  <0.10 <0.10  LATICACIDVEN 0.70  --   --     Recent Results (from the past 240 hour(s))  Culture,  blood (routine x 2) Call MD if unable to obtain prior to antibiotics being given     Status: None (Preliminary result)   Collection Time: 01/25/16  4:25 PM  Result Value Ref Range Status   Specimen Description BLOOD LEFT ANTECUBITAL  Final   Special Requests BOTTLES DRAWN AEROBIC AND ANAEROBIC 5CC  Final   Culture NO GROWTH < 24 HOURS  Final   Report Status PENDING  Incomplete  Culture, blood (routine x 2) Call MD if unable to obtain prior to antibiotics being given     Status: None (Preliminary result)   Collection Time: 01/25/16  4:35 PM  Result Value Ref Range Status   Specimen Description BLOOD RIGHT HAND  Final   Special Requests BOTTLES DRAWN AEROBIC ONLY 5CC  Final   Culture NO GROWTH < 24 HOURS  Final   Report Status PENDING  Incomplete  Urine culture     Status: None (Preliminary result)   Collection Time: 01/25/16  4:59 PM  Result Value Ref Range Status   Specimen Description URINE, CLEAN CATCH  Final   Special Requests NONE  Final   Culture CULTURE REINCUBATED FOR BETTER GROWTH  Final   Report Status PENDING  Incomplete  Respiratory Panel by PCR     Status: None   Collection Time: 01/25/16  9:50 PM  Result Value Ref Range Status   Adenovirus NOT DETECTED NOT DETECTED Final   Coronavirus 229E NOT DETECTED NOT DETECTED Final   Coronavirus HKU1 NOT DETECTED  NOT DETECTED Final   Coronavirus NL63 NOT DETECTED NOT DETECTED Final   Coronavirus OC43 NOT DETECTED NOT DETECTED Final   Metapneumovirus NOT DETECTED NOT DETECTED Final   Rhinovirus / Enterovirus NOT DETECTED NOT DETECTED Final   Influenza A NOT DETECTED NOT DETECTED Final   Influenza B NOT DETECTED NOT DETECTED Final   Parainfluenza Virus 1 NOT DETECTED NOT DETECTED Final   Parainfluenza Virus 2 NOT DETECTED NOT DETECTED Final   Parainfluenza Virus 3 NOT DETECTED NOT DETECTED Final   Parainfluenza Virus 4 NOT DETECTED NOT DETECTED Final   Respiratory Syncytial Virus NOT DETECTED NOT DETECTED Final   Bordetella pertussis NOT DETECTED NOT DETECTED Final   Chlamydophila pneumoniae NOT DETECTED NOT DETECTED Final   Mycoplasma pneumoniae NOT DETECTED NOT DETECTED Final       Radiology Studies: Ct Angio Chest Pe W And/or Wo Contrast  Result Date: 01/25/2016 CLINICAL DATA:  Shortness of breath for 1 week, worsening. EXAM: CT ANGIOGRAPHY CHEST WITH CONTRAST TECHNIQUE: Multidetector CT imaging of the chest was performed using the standard protocol during bolus administration of intravenous contrast. Multiplanar CT image reconstructions and MIPs were obtained to evaluate the vascular anatomy. CONTRAST:  100 mL Isovue 370 COMPARISON:  09/05/2015 chest CT FINDINGS: Cardiovascular: Pulmonary arterial opacification is adequate to the submental level without evidence of embolus. Thoracic aortic atherosclerosis is noted without aneurysm. The heart is normal in size. Left main, left anterior descending, and right coronary artery atherosclerotic calcification. No pericardial effusion. Mediastinum/Nodes: Multiple enlarged mediastinal and hilar lymph nodes are again seen and are stable to minimally increased in size compared to the prior study. The largest measures 1.5 cm in short axis in the right paratracheal station. The thyroid, trachea, and esophagus are unremarkable. Lungs/Pleura: Extensive subpleural  reticulation is again seen with traction bronchiectasis and honeycombing with a clear gradient with greatest changes in the bases. These changes are similar to the prior CT allowing for motion artifact on both studies (Greater on the prior examination),  however there are new areas of confluent ground-glass opacity in the left upper lobe, superior segment of the left lower lobe, and potentially in the more basilar aspects of both lower lobes though evaluation is limited by motion. Upper Abdomen: Unremarkable. Musculoskeletal: No acute osseous abnormality or suspicious osseous lesion identified. Review of the MIP images confirms the above findings. IMPRESSION: 1. Interstitial lung disease consistent with usual interstitial pneumonia. New ground-glass opacity in the left upper and lower lobes is nonspecific but may reflect superimposed pneumonia (including atypical agents), pulmonary hemorrhage, progression of chronic interstitial disease, and less likely asymmetric edema. 2. Stable to slightly increased mediastinal and hilar lymphadenopathy, likely reactive. 3. Aortic atherosclerosis.  Coronary artery atherosclerosis. Electronically Signed   By: Logan Bores M.D.   On: 01/25/2016 16:26   Dg Chest Port 1 View  Result Date: 01/25/2016 CLINICAL DATA:  Shortness of breath for 1 week EXAM: PORTABLE CHEST 1 VIEW COMPARISON:  10/25/2015 FINDINGS: Low volume chest with coarse interstitial opacities correlating with known pulmonary fibrosis. There is generalized increase in interstitial opacity. No focal consolidation, effusion, or pneumothorax. Normal heart size. IMPRESSION: Pulmonary fibrosis. Generalized increase in interstitial opacity compared to 10/25/2015 may reflect progression of interstitial lung disease or acute process, including atypical infection. Electronically Signed   By: Monte Fantasia M.D.   On: 01/25/2016 13:39      Scheduled Meds: . aspirin EC  81 mg Oral Daily  . enoxaparin (LOVENOX)  injection  40 mg Subcutaneous Q24H  . famotidine  20 mg Oral QHS  . insulin aspart  0-5 Units Subcutaneous QHS  . insulin aspart  0-9 Units Subcutaneous TID WC  . ipratropium-albuterol  3 mL Nebulization TID  . levofloxacin  500 mg Oral Daily  . levothyroxine  50 mcg Oral QAC breakfast  . losartan  100 mg Oral Daily  . mouth rinse  15 mL Mouth Rinse BID  . megestrol  200 mg Oral Daily  . methylPREDNISolone (SOLU-MEDROL) injection  60 mg Intravenous Q6H   Continuous Infusions:   LOS: 1 day    Time spent: 40 minutes   Dessa Phi, DO [facility] [email or web address] Password [email or web address] 01/27/2016, 9:50 AM

## 2016-01-28 LAB — GLUCOSE, CAPILLARY: Glucose-Capillary: 170 mg/dL — ABNORMAL HIGH (ref 65–99)

## 2016-01-28 MED ORDER — PREDNISONE 50 MG PO TABS
50.0000 mg | ORAL_TABLET | Freq: Every day | ORAL | Status: DC
Start: 2016-01-28 — End: 2016-01-28
  Administered 2016-01-28: 50 mg via ORAL
  Filled 2016-01-28: qty 1

## 2016-01-28 MED ORDER — PREDNISONE 10 MG PO TABS: ORAL_TABLET | ORAL | 0 refills | Status: DC

## 2016-01-28 MED ORDER — LEVOFLOXACIN 500 MG PO TABS: 500.0000 mg | ORAL_TABLET | Freq: Every day | ORAL | 0 refills | Status: AC

## 2016-01-28 NOTE — Progress Notes (Signed)
Patient was discharged home  With home health and oxygen by MD order; discharged instructions review and give to patient with care notes; IV DIC; skin intact; patient will be escorted to the car by nurse tech via wheelchair.

## 2016-01-28 NOTE — Care Management Note (Signed)
Case Management Note  Patient Details  Name: Kim [name] MRN: [id number] Date of Birth: May 20, 1940  Subjective/Objective:                  resp failure w/hypoxia Action/Plan: Discharge planning Expected Discharge Date:  01/28/16              Expected Discharge Plan:     In-House Referral:     Discharge planning Services  CM Consult  Post Acute Care Choice:  Home Health Choice offered to:  Patient  DME Arranged:  [place] rolling with seat, Oxygen DME Agency:  [place]:  PT [facility] Agency:  [place]  Status of Service:  Completed, signed off  If discussed at [place] of Stay Meetings, dates discussed:    Additional Comments: CM met with pt in room to offer choice of home health agency.  Pt chooses [facility] to render HHPT.  Referral called to [facility] rep, Jermaine.  CM notified [place] DME rep, Reggie to please deliver the rollator and oxygen tank for transport home and arrange for O2 setup at home.  Daughter is providing transportation home.  No other CM needs were communicated. Dellie Catholic, RN 01/28/2016, 1:06 PM

## 2016-01-28 NOTE — Discharge Summary (Addendum)
Physician Discharge Summary  Kim [name] AUQ:[id number] DOB: [birth year] DOA: 01/25/2016  PCP: Kim Peers, MD  Admit date: 01/25/2016 Discharge date: 01/28/2016  Admitted From: Home Disposition:  Home  Recommendations for Outpatient Follow-up:  1. Follow up with PCP in 1-2 weeks 2. Follow up with Kim [name] (pulmonology) this week 3. Please follow up on the following pending results: final blood culture result, sputum culture result   Home Health: PT  Equipment/Devices: Home O2   Discharge Condition: Stable CODE STATUS: Full  Diet recommendation: carb modified   Brief/Interim Summary: From H&P: Kim [name] a 75 y.o.femalewith medical history significant fordiabetes on metformin, hypertension, dyslipidemia,and hypothyroidism. She has a history of childhood asthma and developed worsening cough and dyspnea symptoms beginning in 2015 for which she was referred to pulmonology/Dr. Murvin [name]. She has had abnormal chest x-ray finding since 2006 concern for pulmonary fibrosis. Prior CT of the chest compatible with interstitial lung disease.She has experienced documented decrease in pulse oximetry with ambulation. CT of the chest in July 2017 consistent with UIP with max GERDoral therapy for 6 weeks. She returns with reports of 2 weeks of shortness of breath/DOE with generalized malaise ongoing for at least 1 month. This is been associated with a dry cough and apparent increased heart rate prior to arrival. Upon arrival to the ER her room air saturations were 86% with a heart rate of 108 and respiratory rate of 40. Oxygen was applied with improvement of sats to 94% and decrease in respiratory rate 26 breathsper minute. ABG on oxygen with PCO2 36 PO2 106. Chest x-ray demonstrating either progression of pulmonary fibrosis versus acute process such as atypical infection therefore ER has treated as atypical presentation of community-acquired pneumonia. CT of the chest negative for PE.    Interim: Patient was empirically treated for CAP with azithromax/rocephin which was switched to levaquin. She was also treated with IV solumederol which was switched to oral prednisone. She had a home O2 screen which was 95% at rest on room air and 86% with ambulation on room air and 91% with 2L [place] O2 with ambulation. She is set up with home O2 at time of discharge.   Subjective on day of discharge: States that her breathing is better. Still has minimal cough. No chest pain, nausea, vomiting. Feeling well, wants to go home.   Discharge Diagnoses:  Principal Problem:   Acute respiratory failure with hypoxia (HCC) Active Problems:   Diabetes mellitus type 2 in obese (HCC)   HTN (hypertension)   Pure hypercholesterolemia   Postinflammatory pulmonary fibrosis (history of)   Hypothyroidism   Atypical pneumonia  Acute respiratory failure with hypoxemia -Due to ILD with ?pneumonia -[name] to maintain sat > 90%. Will discharge with home O2.   Chronic interstitial lung disease -Follows with Kim [name] (pulm). Last visit 09/21/2015 which Kim [name] felt it was very unlikely to be classic UIP but more with remote ALI  -ESR: 4.7and CRP:60 -Continue prednisone - taper at discharge  -Continue PPI and H2 blocker  ? Atypical pneumonia -Radiologist questioning atypical presentation of pneumonia due to new ground-glass opacity in left upper, left lower lobes  -Procalcitonin low -Respiratory viral panel negative -Strep pneumo Ag negative -Legionella Ag negative  -Respiratory culture pending -Blood culture negative to date. Final result pending -Continue levaquin at discharge   DM type 2 -Ha1c 6.0 -Hold metformin -SSI  HTN -Continue losartan-HCTZ  Hypothyroidism, hx Grave's disease s/p RAI treatment  -Continue synthroid   Chronic diastolic heart  failure -Echo 12/1: EF 62-26%, grade 1 diastolic dysfunction   Lactobacillus in urine -Contaminant. No treatment.    Discharge  Instructions  Discharge Instructions    Diet Carb Modified    Complete by:  As directed    Increase activity slowly    Complete by:  As directed        Medication List    TAKE these medications   acetaminophen 500 MG tablet Commonly known as:  TYLENOL Take 500 mg by mouth every 6 (six) hours as needed for mild pain or moderate pain.   aspirin EC 81 MG tablet Take 81 mg by mouth daily.   famotidine 20 MG tablet Commonly known as:  PEPCID Take 1 tablet (20 mg total) by mouth at bedtime.   guaiFENesin-codeine 100-10 MG/5ML syrup Commonly known as:  ROBITUSSIN AC Take 5-10 mLs by mouth every 8 (eight) hours as needed for cough.   levofloxacin 500 MG tablet Commonly known as:  LEVAQUIN Take 1 tablet (500 mg total) by mouth daily.   levothyroxine 50 MCG tablet Commonly known as:  SYNTHROID, LEVOTHROID Take 50 mcg by mouth daily before breakfast.   losartan-hydrochlorothiazide 100-12.5 MG tablet Commonly known as:  HYZAAR Take 1 tablet by mouth daily.   megestrol 40 MG/ML suspension Commonly known as:  MEGACE Take 200 mg by mouth daily.   metFORMIN 500 MG tablet Commonly known as:  GLUCOPHAGE TAKE 1 TABLET BY MOUTH TWICE A DAY   pantoprazole 40 MG tablet Commonly known as:  PROTONIX Take 1 tablet (40 mg total) by mouth daily. Take 30-60 min before first meal of the day   predniSONE 10 MG tablet Commonly known as:  DELTASONE Take 4 tabs for 3 days, then 3 tabs for 3 days, then 2 tabs for 3 days, then 1 tab for 3 days, then 1/2 tab for 4 days.      Follow-up Information    Kim Peers, MD. Schedule an appointment as soon as possible for a visit in 1 week(s).   Specialty:  Family Medicine Contact information: [address] [place] [address] [phone]        Kim Gully, MD. Schedule an appointment as soon as possible for a visit in 1 week(s).   Specialty:  Pulmonary Disease Contact information: [address]  [address] [phone]          Allergies  Allergen Reactions  . Codeine Nausea And Vomiting    "Takes my appetite"  . Penicillins Rash    Has patient had a PCN reaction causing immediate rash, facial/tongue/throat swelling, SOB or lightheadedness with hypotension: Yes Has patient had a PCN reaction causing severe rash involving mucus membranes or skin necrosis: No Has patient had a PCN reaction that required hospitalization No Has patient had a PCN reaction occurring within the last 10 years: No If all of the above answers are "NO", then may proceed with Cephalosporin use.     Consultations:  none   Procedures/Studies: Ct Angio Chest Pe W And/or Wo Contrast  Result Date: 01/25/2016 CLINICAL DATA:  Shortness of breath for 1 week, worsening. EXAM: CT ANGIOGRAPHY CHEST WITH CONTRAST TECHNIQUE: Multidetector CT imaging of the chest was performed using the standard protocol during bolus administration of intravenous contrast. Multiplanar CT image reconstructions and MIPs were obtained to evaluate the vascular anatomy. CONTRAST:  100 mL Isovue 370 COMPARISON:  09/05/2015 chest CT FINDINGS: Cardiovascular: Pulmonary arterial opacification is adequate to the submental level without evidence of  embolus. Thoracic aortic atherosclerosis is noted without aneurysm. The heart is normal in size. Left main, left anterior descending, and right coronary artery atherosclerotic calcification. No pericardial effusion. Mediastinum/Nodes: Multiple enlarged mediastinal and hilar lymph nodes are again seen and are stable to minimally increased in size compared to the prior study. The largest measures 1.5 cm in short axis in the right paratracheal station. The thyroid, trachea, and esophagus are unremarkable. Lungs/Pleura: Extensive subpleural reticulation is again seen with traction bronchiectasis and honeycombing with a clear gradient with greatest changes in the bases. These changes are similar to the prior CT  allowing for motion artifact on both studies (Greater on the prior examination), however there are new areas of confluent ground-glass opacity in the left upper lobe, superior segment of the left lower lobe, and potentially in the more basilar aspects of both lower lobes though evaluation is limited by motion. Upper Abdomen: Unremarkable. Musculoskeletal: No acute osseous abnormality or suspicious osseous lesion identified. Review of the MIP images confirms the above findings. IMPRESSION: 1. Interstitial lung disease consistent with usual interstitial pneumonia. New ground-glass opacity in the left upper and lower lobes is nonspecific but may reflect superimposed pneumonia (including atypical agents), pulmonary hemorrhage, progression of chronic interstitial disease, and less likely asymmetric edema. 2. Stable to slightly increased mediastinal and hilar lymphadenopathy, likely reactive. 3. Aortic atherosclerosis.  Coronary artery atherosclerosis. Electronically Signed   By: Logan Bores M.D.   On: 01/25/2016 16:26   Dg Chest Port 1 View  Result Date: 01/25/2016 CLINICAL DATA:  Shortness of breath for 1 week EXAM: PORTABLE CHEST 1 VIEW COMPARISON:  10/25/2015 FINDINGS: Low volume chest with coarse interstitial opacities correlating with known pulmonary fibrosis. There is generalized increase in interstitial opacity. No focal consolidation, effusion, or pneumothorax. Normal heart size. IMPRESSION: Pulmonary fibrosis. Generalized increase in interstitial opacity compared to 10/25/2015 may reflect progression of interstitial lung disease or acute process, including atypical infection. Electronically Signed   By: Monte Fantasia M.D.   On: 01/25/2016 13:39      Discharge Exam: Vitals:   01/27/16 2208 01/28/16 0634  BP: (!) 125/51 (!) 120/54  Pulse: 95 66  Resp: 18 17  Temp: 98 F (36.7 C) 97.9 F (36.6 C)   Vitals:   01/27/16 1457 01/27/16 2014 01/27/16 2208 01/28/16 0634  BP: 118/61  (!) 125/51 (!)  120/54  Pulse: 73  95 66  Resp:   18 17  Temp: 98 F (36.7 C)  98 F (36.7 C) 97.9 F (36.6 C)  TempSrc: Oral  Oral Oral  SpO2: 100% 97% 97% 99%  Weight:    64.1 kg (141 lb 4.8 oz)  Height:       General exam: Appears calm and comfortable  Respiratory system: Crackles diffusely, no wheeze, on [place] O2, no resp distress, no conversational dyspnea   Cardiovascular system: S1 & S2 heard, RRR. No JVD, murmurs, rubs, gallops or clicks. No pedal edema. Gastrointestinal system: Abdomen is nondistended, soft and nontender. No organomegaly or masses felt. Normal bowel sounds heard. Central nervous system: Alert and oriented. No focal neurological deficits. Extremities: Symmetric 5 x 5 power. Skin: No rashes, lesions or ulcers Psychiatry: Judgement and insight appear normal. Mood & affect appropriate.     The results of significant diagnostics from this hospitalization (including imaging, microbiology, ancillary and laboratory) are listed below for reference.     Microbiology: Recent Results (from the past 240 hour(s))  Culture, blood (routine x 2) Call MD if unable to obtain prior  to antibiotics being given     Status: None (Preliminary result)   Collection Time: 01/25/16  4:25 PM  Result Value Ref Range Status   Specimen Description BLOOD LEFT ANTECUBITAL  Final   Special Requests BOTTLES DRAWN AEROBIC AND ANAEROBIC 5CC  Final   Culture NO GROWTH 2 DAYS  Final   Report Status PENDING  Incomplete  Culture, blood (routine x 2) Call MD if unable to obtain prior to antibiotics being given     Status: None (Preliminary result)   Collection Time: 01/25/16  4:35 PM  Result Value Ref Range Status   Specimen Description BLOOD RIGHT HAND  Final   Special Requests BOTTLES DRAWN AEROBIC ONLY 5CC  Final   Culture NO GROWTH 2 DAYS  Final   Report Status PENDING  Incomplete  Urine culture     Status: Abnormal   Collection Time: 01/25/16  4:59 PM  Result Value Ref Range Status   Specimen  Description URINE, CLEAN CATCH  Final   Special Requests NONE  Final   Culture (A)  Final    >=100,000 COLONIES/mL LACTOBACILLUS SPECIES Standardized susceptibility testing for this organism is not available.    Report Status 01/27/2016 FINAL  Final  Respiratory Panel by PCR     Status: None   Collection Time: 01/25/16  9:50 PM  Result Value Ref Range Status   Adenovirus NOT DETECTED NOT DETECTED Final   Coronavirus 229E NOT DETECTED NOT DETECTED Final   Coronavirus HKU1 NOT DETECTED NOT DETECTED Final   Coronavirus NL63 NOT DETECTED NOT DETECTED Final   Coronavirus OC43 NOT DETECTED NOT DETECTED Final   Metapneumovirus NOT DETECTED NOT DETECTED Final   Rhinovirus / Enterovirus NOT DETECTED NOT DETECTED Final   Influenza A NOT DETECTED NOT DETECTED Final   Influenza B NOT DETECTED NOT DETECTED Final   Parainfluenza Virus 1 NOT DETECTED NOT DETECTED Final   Parainfluenza Virus 2 NOT DETECTED NOT DETECTED Final   Parainfluenza Virus 3 NOT DETECTED NOT DETECTED Final   Parainfluenza Virus 4 NOT DETECTED NOT DETECTED Final   Respiratory Syncytial Virus NOT DETECTED NOT DETECTED Final   Bordetella pertussis NOT DETECTED NOT DETECTED Final   Chlamydophila pneumoniae NOT DETECTED NOT DETECTED Final   Mycoplasma pneumoniae NOT DETECTED NOT DETECTED Final     Labs: BNP (last 3 results)  Recent Labs  10/25/15 1840 01/25/16 1341  BNP 13.6 32.9   Basic Metabolic Panel:  Recent Labs Lab 01/25/16 1341 01/25/16 2128 01/26/16 0351  NA 136  --  136  K 3.9  --  3.6  CL 105  --  103  CO2 23  --  20*  GLUCOSE 115*  --  219*  BUN 13  --  17  CREATININE 0.93  --  1.02*  CALCIUM 9.4  --  9.1  MG  --  1.8  --   PHOS  --  3.1  --    Liver Function Tests:  Recent Labs Lab 01/25/16 1341 01/26/16 0351  AST 16 19  ALT 7* 9*  ALKPHOS 58 50  BILITOT 0.2* <0.1*  PROT 7.5 6.8  ALBUMIN 2.9* 2.7*   No results for input(s): LIPASE, AMYLASE in the last 168 hours. No results for  input(s): AMMONIA in the last 168 hours. CBC:  Recent Labs Lab 01/25/16 1341 01/26/16 0351  WBC 10.8* 7.6  NEUTROABS 7.1  --   HGB 13.2 11.3*  HCT 39.4 34.3*  MCV 80.9 80.9  PLT 415* 411*   Cardiac  Enzymes:  Recent Labs Lab 01/25/16 1341 01/25/16 1625  CKTOTAL  --  20*  TROPONINI <0.03  --    BNP: Invalid input(s): POCBNP CBG:  Recent Labs Lab 01/26/16 2046 01/27/16 0814 01/27/16 1142 01/27/16 1700 01/27/16 2207  GLUCAP 154* 160* 182* 128* 240*   D-Dimer No results for input(s): DDIMER in the last 72 hours. Hgb A1c  Recent Labs  01/25/16 2128  HGBA1C 6.0*   Lipid Profile No results for input(s): CHOL, HDL, LDLCALC, TRIG, CHOLHDL, LDLDIRECT in the last 72 hours. Thyroid function studies  Recent Labs  01/25/16 1341  TSH 2.040   Anemia work up No results for input(s): VITAMINB12, FOLATE, FERRITIN, TIBC, IRON, RETICCTPCT in the last 72 hours. Urinalysis    Component Value Date/Time   COLORURINE YELLOW 01/25/2016 [place] 01/25/2016 1659   LABSPEC 1.025 01/25/2016 1659   PHURINE 5.0 01/25/2016 1659   GLUCOSEU NEGATIVE 01/25/2016 1659   HGBUR NEGATIVE 01/25/2016 1659   BILIRUBINUR NEGATIVE 01/25/2016 1659   KETONESUR NEGATIVE 01/25/2016 1659   PROTEINUR NEGATIVE 01/25/2016 1659   UROBILINOGEN 0.2 10/04/2014 0100   NITRITE NEGATIVE 01/25/2016 1659   LEUKOCYTESUR NEGATIVE 01/25/2016 1659   Sepsis Labs Invalid input(s): PROCALCITONIN,  WBC,  LACTICIDVEN Microbiology Recent Results (from the past 240 hour(s))  Culture, blood (routine x 2) Call MD if unable to obtain prior to antibiotics being given     Status: None (Preliminary result)   Collection Time: 01/25/16  4:25 PM  Result Value Ref Range Status   Specimen Description BLOOD LEFT ANTECUBITAL  Final   Special Requests BOTTLES DRAWN AEROBIC AND ANAEROBIC 5CC  Final   Culture NO GROWTH 2 DAYS  Final   Report Status PENDING  Incomplete  Culture, blood (routine x 2) Call MD if  unable to obtain prior to antibiotics being given     Status: None (Preliminary result)   Collection Time: 01/25/16  4:35 PM  Result Value Ref Range Status   Specimen Description BLOOD RIGHT HAND  Final   Special Requests BOTTLES DRAWN AEROBIC ONLY 5CC  Final   Culture NO GROWTH 2 DAYS  Final   Report Status PENDING  Incomplete  Urine culture     Status: Abnormal   Collection Time: 01/25/16  4:59 PM  Result Value Ref Range Status   Specimen Description URINE, CLEAN CATCH  Final   Special Requests NONE  Final   Culture (A)  Final    >=100,000 COLONIES/mL LACTOBACILLUS SPECIES Standardized susceptibility testing for this organism is not available.    Report Status 01/27/2016 FINAL  Final  Respiratory Panel by PCR     Status: None   Collection Time: 01/25/16  9:50 PM  Result Value Ref Range Status   Adenovirus NOT DETECTED NOT DETECTED Final   Coronavirus 229E NOT DETECTED NOT DETECTED Final   Coronavirus HKU1 NOT DETECTED NOT DETECTED Final   Coronavirus NL63 NOT DETECTED NOT DETECTED Final   Coronavirus OC43 NOT DETECTED NOT DETECTED Final   Metapneumovirus NOT DETECTED NOT DETECTED Final   Rhinovirus / Enterovirus NOT DETECTED NOT DETECTED Final   Influenza A NOT DETECTED NOT DETECTED Final   Influenza B NOT DETECTED NOT DETECTED Final   Parainfluenza Virus 1 NOT DETECTED NOT DETECTED Final   Parainfluenza Virus 2 NOT DETECTED NOT DETECTED Final   Parainfluenza Virus 3 NOT DETECTED NOT DETECTED Final   Parainfluenza Virus 4 NOT DETECTED NOT DETECTED Final   Respiratory Syncytial Virus NOT DETECTED NOT DETECTED Final  Bordetella pertussis NOT DETECTED NOT DETECTED Final   Chlamydophila pneumoniae NOT DETECTED NOT DETECTED Final   Mycoplasma pneumoniae NOT DETECTED NOT DETECTED Final     Time coordinating discharge: Over 30 minutes  SIGNED:  Dessa Phi, DO [facility] Pager [phone]  If 7PM-7AM, please contact night-coverage [email or web address] Password  [email or web address] 01/28/2016, 7:50 AM

## 2016-01-29 LAB — GLUCOSE, CAPILLARY: Glucose-Capillary: 169 mg/dL — ABNORMAL HIGH (ref 65–99)

## 2016-01-30 ENCOUNTER — Ambulatory Visit (INDEPENDENT_AMBULATORY_CARE_PROVIDER_SITE_OTHER): Payer: Medicare Other | Admitting: Adult Health

## 2016-01-30 ENCOUNTER — Encounter: Payer: Self-pay | Admitting: Adult Health

## 2016-01-30 DIAGNOSIS — J189 Pneumonia, unspecified organism: Secondary | ICD-10-CM

## 2016-01-30 LAB — CULTURE, BLOOD (ROUTINE X 2)
CULTURE: NO GROWTH
Culture: NO GROWTH

## 2016-01-30 NOTE — Patient Instructions (Addendum)
Finish Levaquin and Prednisone as directed.  Wear Oxygen 2l/m with walking .  Order for smaller O2 tank.  Follow up Dr. Sherene SiresWert  In 3-4 weeks with chest xray and As needed   Please contact office for sooner follow up if symptoms do not improve or worsen or seek emergency care

## 2016-01-30 NOTE — Progress Notes (Signed)
Subjective:    Patient ID: Kim [name], female    DOB: 1940-11-04, 75 y.o.   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 Pulmonary Consult    Referred per Dr. Parke Simmers. Pt c/o cough x 2 months- non prod and esp worse at night and when exposed to strong smells.   indolent onset dry coughing, worse x 2 months with  day > noct  On hyzar since at least 2011 Walks up to 4 blocks when cool s  sob Main problem is smelling cigs/ cleaning solutions.  No previous hx ca, chemo, connective tissue dz, exp to macrodantin or amiodarone to her knowledge rec Pantoprazole (protonix) 40 mg   Take 30-60 min before first meal of the day and Pepcid 20 mg one bedtime until return to office - this is the best way to tell whether stomach acid is contributing to your problem.   GERD diet    12/06/2013 f/u ov/Wert re: PF presumably from remote ali related to chemical exp/ cough  Chief Complaint  Patient presents with  . Follow-up    Pt states that her cough has improved some since the last visit. She states that she mainly only coughs at night or if she gets hot.   coughs some still  But mostly p supper, not while sleeping unless gets hot  Walks up to 30 min, some inclines - no sob Not limited by breathing from desired activities rec Change pepcid to where you take it after supper to see if helps your night time cough We will refill your medications when they run out but  don't stop either acid suppressor as they may be helping your cough  Prevnar and flu shots given today     03/28/2014 f/u ov/Wert re: PF from remote ALI Chief Complaint  Patient presents with  . Follow-up    Pt c/o occasional prod cough with clear mucus, no other complaints at this time.   noct cough better on pepcid p supper  rec To get the most out of exercise, you need to be continuously aware that you are short of breath, but never out of breath, for 30 minutes daily. As you improve, it will actually be easier for you to do the same amount of exercise  in  30 minutes so always push to the level where you are short of breath.   Please schedule a follow up visit in 3 months  > did not do    09/21/2015  Consultation Wert GU:[id number]/ no longer on any gerd rx / abn CTchest c/w UIP Chief Complaint  Patient presents with  . Pulmonary Consult    Referred by Dr Margaretmary Bayley for eval of abnormal ct chest. She c/o increased cough, esp at night. Cough is prod at times with clear sputum. She also c/o occ DOE "not too often".   Not limited by breathing from desired  activities  But by knee pain so very sedentary Cough is worse after supper and hs and doesn't remember whether it flared related to when she stopped the gerd rx as the cough was indolent onset, minimally progressive and minimally productive x months s change with environment, weather or exertion >>  01/30/2016 Follow up : Post hospital follow up for PNA  Pt presents for an extended office visit . Recently admitted for CAP , ILD . She was treated with IV abx and steroids . CT chest was neg for PE, ILD changes w/ LUL and LLL GGO c/w PNA . Viral panel was neg   Discharged 2 days ago. She remains on Levaquin and Prednisone taper .  She was started on O2 with walking due to desats.  Since discharge she is starting to feel some better.  Still weak but seems to be getting better. Cough is decreased some.  Has seen blood tingged mucus some but is  better as well.  Appetite is very good, no nv/d.   Current Medications, Allergies, Complete Past Medical History, Past Surgical History, Family History, and Social History were reviewed in [facility] electronic medical record.            Review of Systems  Constitutional:   No  weight loss, night sweats,  Fevers, chills, + fatigue, or  lassitude.  HEENT:   No headaches,  Difficulty swallowing,  Tooth/dental problems, or  Sore throat,                No sneezing, itching, ear ache, nasal congestion, post nasal drip,   CV:  No chest pain,  Orthopnea, PND, swelling in lower extremities, anasarca, dizziness, palpitations, syncope.   GI  No heartburn, indigestion, abdominal pain, nausea, vomiting, diarrhea, change in bowel habits, loss of appetite, bloody stools.   Resp:    No chest wall deformity  Skin: no rash or lesions.  GU: no dysuria, change in color of urine, no urgency or frequency.  No flank pain, no hematuria   MS:  No joint pain or swelling.  No decreased range of motion.  No back pain.  Psych:  No change in mood or affect. No depression or anxiety.  No memory loss.          Objective:   Physical Exam  amb hoarse bf nad   Vital signs reviewed   Vitals:   01/30/16 1122  BP: 128/68  Pulse: (!) 117  Temp: 98.1 F (36.7 C)  TempSrc: Oral  SpO2: 98%  Weight: 142 lb 9.6 oz (64.7 kg)  Height: 5\' 9"  (1.753 m)      HEENT: upper dentures/ nl  turbinates, and oropharynx. Nl external ear canals without cough reflex   NECK :  without JVD/Nodes/TM/ nl carotid upstrokes bilaterally   LUNGS: no acc muscle use,   Min insp crackles bases bilaterally s cough on insp    CV:  RRR  no s3 or murmur or increase in P2, no edema   ABD:  soft and nontender with nl inspiratory excursion in the supine position. No bruits or organomegaly, bowel sounds nl  MS:  Nl gait/ ext warm without deformities, calf tenderness, cyanosis or clubbing No obvious joint restrictions     SKIN: warm and dry without lesions    NEURO:  alert, approp, nl sensorium with  no motor deficits      CT Chest   01/25/16  IMPRESSION: 1. Interstitial lung disease consistent with usual interstitial pneumonia. New ground-glass  opacity in the left upper and lower lobes is nonspecific but may reflect superimposed pneumonia (including atypical agents), pulmonary hemorrhage, progression of chronic interstitial disease, and less likely asymmetric edema. 2. Stable to slightly increased mediastinal and hilar lymphadenopathy, likely reactive.  Reese Senk NP-C  [facility]   01/30/2016

## 2016-01-30 NOTE — Progress Notes (Signed)
Chart and office note reviewed in detail  > agree with a/p as outlined    

## 2016-01-30 NOTE — Addendum Note (Signed)
Addended by: Abigail MiyamotoPHELPS, DENISE D on: 01/30/2016 12:18 PM   Modules accepted: Orders

## 2016-01-30 NOTE — Assessment & Plan Note (Signed)
Recent admission with CAP superimposed on ILD  Starting to slowly improve , will need follow up cxr   Plan  Patient Instructions  Finish Levaquin and Prednisone as directed.  Wear Oxygen 2l/m with walking .  Order for smaller O2 tank.  Follow up Dr. Sherene SiresWert  In 3-4 weeks with chest xray and As needed   Please contact office for sooner follow up if symptoms do not improve or worsen or seek emergency care

## 2016-02-05 ENCOUNTER — Encounter (HOSPITAL_COMMUNITY): Payer: Self-pay | Admitting: Emergency Medicine

## 2016-02-05 ENCOUNTER — Emergency Department (HOSPITAL_COMMUNITY): Payer: Medicare Other

## 2016-02-05 ENCOUNTER — Inpatient Hospital Stay (HOSPITAL_COMMUNITY)
Admission: EM | Admit: 2016-02-05 | Discharge: 2016-02-09 | DRG: 871 | Disposition: A | Discharge status: Home Health | Payer: Medicare Other | Attending: Internal Medicine | Admitting: Internal Medicine

## 2016-02-05 ENCOUNTER — Telehealth: Payer: Self-pay | Admitting: Internal Medicine

## 2016-02-05 DIAGNOSIS — J8410 Pulmonary fibrosis, unspecified: Secondary | ICD-10-CM | POA: Diagnosis present

## 2016-02-05 DIAGNOSIS — I700 Atherosclerosis of aorta: Secondary | ICD-10-CM | POA: Diagnosis present

## 2016-02-05 DIAGNOSIS — E118 Type 2 diabetes mellitus with unspecified complications: Secondary | ICD-10-CM | POA: Diagnosis present

## 2016-02-05 DIAGNOSIS — E669 Obesity, unspecified: Secondary | ICD-10-CM

## 2016-02-05 DIAGNOSIS — J189 Pneumonia, unspecified organism: Secondary | ICD-10-CM | POA: Diagnosis present

## 2016-02-05 DIAGNOSIS — J9621 Acute and chronic respiratory failure with hypoxia: Secondary | ICD-10-CM | POA: Diagnosis present

## 2016-02-05 DIAGNOSIS — R768 Other specified abnormal immunological findings in serum: Secondary | ICD-10-CM | POA: Diagnosis present

## 2016-02-05 DIAGNOSIS — R05 Cough: Secondary | ICD-10-CM | POA: Diagnosis present

## 2016-02-05 DIAGNOSIS — I10 Essential (primary) hypertension: Secondary | ICD-10-CM | POA: Diagnosis present

## 2016-02-05 DIAGNOSIS — J302 Other seasonal allergic rhinitis: Secondary | ICD-10-CM | POA: Diagnosis present

## 2016-02-05 DIAGNOSIS — K219 Gastro-esophageal reflux disease without esophagitis: Secondary | ICD-10-CM | POA: Diagnosis present

## 2016-02-05 DIAGNOSIS — J849 Interstitial pulmonary disease, unspecified: Secondary | ICD-10-CM

## 2016-02-05 DIAGNOSIS — K828 Other specified diseases of gallbladder: Secondary | ICD-10-CM | POA: Diagnosis present

## 2016-02-05 DIAGNOSIS — R058 Other specified cough: Secondary | ICD-10-CM | POA: Diagnosis present

## 2016-02-05 DIAGNOSIS — Z880 Allergy status to penicillin: Secondary | ICD-10-CM

## 2016-02-05 DIAGNOSIS — Z79899 Other long term (current) drug therapy: Secondary | ICD-10-CM

## 2016-02-05 DIAGNOSIS — Z96651 Presence of right artificial knee joint: Secondary | ICD-10-CM | POA: Diagnosis present

## 2016-02-05 DIAGNOSIS — Z7982 Long term (current) use of aspirin: Secondary | ICD-10-CM

## 2016-02-05 DIAGNOSIS — E039 Hypothyroidism, unspecified: Secondary | ICD-10-CM | POA: Diagnosis present

## 2016-02-05 DIAGNOSIS — A419 Sepsis, unspecified organism: Principal | ICD-10-CM | POA: Diagnosis present

## 2016-02-05 DIAGNOSIS — J210 Acute bronchiolitis due to respiratory syncytial virus: Secondary | ICD-10-CM | POA: Diagnosis present

## 2016-02-05 DIAGNOSIS — R0602 Shortness of breath: Secondary | ICD-10-CM

## 2016-02-05 DIAGNOSIS — Z7952 Long term (current) use of systemic steroids: Secondary | ICD-10-CM

## 2016-02-05 DIAGNOSIS — J9601 Acute respiratory failure with hypoxia: Secondary | ICD-10-CM | POA: Diagnosis present

## 2016-02-05 DIAGNOSIS — J9611 Chronic respiratory failure with hypoxia: Secondary | ICD-10-CM | POA: Diagnosis present

## 2016-02-05 DIAGNOSIS — Z7984 Long term (current) use of oral hypoglycemic drugs: Secondary | ICD-10-CM

## 2016-02-05 DIAGNOSIS — E119 Type 2 diabetes mellitus without complications: Secondary | ICD-10-CM | POA: Diagnosis present

## 2016-02-05 DIAGNOSIS — R109 Unspecified abdominal pain: Secondary | ICD-10-CM

## 2016-02-05 DIAGNOSIS — I2510 Atherosclerotic heart disease of native coronary artery without angina pectoris: Secondary | ICD-10-CM | POA: Diagnosis present

## 2016-02-05 DIAGNOSIS — Z8249 Family history of ischemic heart disease and other diseases of the circulatory system: Secondary | ICD-10-CM

## 2016-02-05 DIAGNOSIS — J84112 Idiopathic pulmonary fibrosis: Secondary | ICD-10-CM | POA: Diagnosis present

## 2016-02-05 DIAGNOSIS — Y95 Nosocomial condition: Secondary | ICD-10-CM | POA: Diagnosis present

## 2016-02-05 DIAGNOSIS — E785 Hyperlipidemia, unspecified: Secondary | ICD-10-CM | POA: Diagnosis present

## 2016-02-05 DIAGNOSIS — E1169 Type 2 diabetes mellitus with other specified complication: Secondary | ICD-10-CM

## 2016-02-05 DIAGNOSIS — Z87891 Personal history of nicotine dependence: Secondary | ICD-10-CM

## 2016-02-05 HISTORY — DX: Acute bronchiolitis due to respiratory syncytial virus: J21.0

## 2016-02-05 HISTORY — DX: Other specified diseases of gallbladder: K82.8

## 2016-02-05 HISTORY — DX: Other specified abnormal immunological findings in serum: R76.8

## 2016-02-05 LAB — CBC WITH DIFFERENTIAL/PLATELET
BASOS PCT: 0 %
Basophils Absolute: 0 10*3/uL (ref 0.0–0.1)
EOS PCT: 3 %
Eosinophils Absolute: 0.9 10*3/uL — ABNORMAL HIGH (ref 0.0–0.7)
HEMATOCRIT: 41 % (ref 36.0–46.0)
Hemoglobin: 13.4 g/dL (ref 12.0–15.0)
LYMPHS ABS: 5.4 10*3/uL — AB (ref 0.7–4.0)
Lymphocytes Relative: 19 %
MCH: 26.9 pg (ref 26.0–34.0)
MCHC: 32.7 g/dL (ref 30.0–36.0)
MCV: 82.3 fL (ref 78.0–100.0)
MONO ABS: 1.7 10*3/uL — AB (ref 0.1–1.0)
MONOS PCT: 6 %
NEUTROS ABS: 20.5 10*3/uL — AB (ref 1.7–7.7)
Neutrophils Relative %: 72 %
PLATELETS: 436 10*3/uL — AB (ref 150–400)
RBC: 4.98 MIL/uL (ref 3.87–5.11)
RDW: 16.2 % — AB (ref 11.5–15.5)
WBC: 28.5 10*3/uL — AB (ref 4.0–10.5)

## 2016-02-05 LAB — COMPREHENSIVE METABOLIC PANEL
ALBUMIN: 3.1 g/dL — AB (ref 3.5–5.0)
ALT: 13 U/L — AB (ref 14–54)
AST: 21 U/L (ref 15–41)
Alkaline Phosphatase: 50 U/L (ref 38–126)
Anion gap: 12 (ref 5–15)
BUN: 16 mg/dL (ref 6–20)
CHLORIDE: 100 mmol/L — AB (ref 101–111)
CO2: 25 mmol/L (ref 22–32)
CREATININE: 0.91 mg/dL (ref 0.44–1.00)
Calcium: 8.8 mg/dL — ABNORMAL LOW (ref 8.9–10.3)
GFR calc Af Amer: >60 mL/min (ref >60)
GLUCOSE: 94 mg/dL (ref 65–99)
Potassium: 4.2 mmol/L (ref 3.5–5.1)
Sodium: 137 mmol/L (ref 135–145)
Total Bilirubin: 0.3 mg/dL (ref 0.3–1.2)
Total Protein: 6.5 g/dL (ref 6.5–8.1)

## 2016-02-05 LAB — URINALYSIS, ROUTINE W REFLEX MICROSCOPIC
BILIRUBIN URINE: NEGATIVE
Bacteria, UA: NONE SEEN
Glucose, UA: NEGATIVE mg/dL
Hgb urine dipstick: NEGATIVE
KETONES UR: NEGATIVE mg/dL
Nitrite: NEGATIVE
PH: 7 (ref 5.0–8.0)
Protein, ur: NEGATIVE mg/dL
SPECIFIC GRAVITY, URINE: 1.014 (ref 1.005–1.030)

## 2016-02-05 LAB — I-STAT CG4 LACTIC ACID, ED
LACTIC ACID, VENOUS: 2.44 mmol/L — AB (ref 0.5–1.9)
Lactic Acid, Venous: 2.6 mmol/L (ref 0.5–1.9)

## 2016-02-05 LAB — I-STAT ARTERIAL BLOOD GAS, ED
Acid-Base Excess: 4 mmol/L — ABNORMAL HIGH (ref 0.0–2.0)
Bicarbonate: 27.6 mmol/L (ref 20.0–28.0)
O2 SAT: 95 %
PH ART: 7.469 — AB (ref 7.350–7.450)
TCO2: 29 mmol/L (ref 0–100)
pCO2 arterial: 38 mmHg (ref 32.0–48.0)
pO2, Arterial: 71 mmHg — ABNORMAL LOW (ref 83.0–108.0)

## 2016-02-05 LAB — I-STAT TROPONIN, ED: TROPONIN I, POC: 0 ng/mL (ref 0.00–0.08)

## 2016-02-05 LAB — BRAIN NATRIURETIC PEPTIDE: B NATRIURETIC PEPTIDE 5: 28.4 pg/mL (ref 0.0–100.0)

## 2016-02-05 MED ORDER — DEXTROSE 5 % IV SOLN
1.0000 g | Freq: Three times a day (TID) | INTRAVENOUS | Status: DC
  Administered 2016-02-06 – 2016-02-07 (×4): 1 g via INTRAVENOUS
  Filled 2016-02-05 (×6): qty 1

## 2016-02-05 MED ORDER — VANCOMYCIN HCL IN DEXTROSE 1-5 GM/200ML-% IV SOLN
1000.0000 mg | Freq: Once | INTRAVENOUS | Status: AC
  Administered 2016-02-05: 1000 mg via INTRAVENOUS
  Filled 2016-02-05: qty 200

## 2016-02-05 MED ORDER — AZTREONAM 2 G IJ SOLR: 2.0000 g | INTRAMUSCULAR | Status: DC

## 2016-02-05 MED ORDER — VANCOMYCIN HCL 500 MG IV SOLR
500.0000 mg | Freq: Two times a day (BID) | INTRAVENOUS | Status: DC
  Administered 2016-02-06 – 2016-02-07 (×3): 500 mg via INTRAVENOUS
  Filled 2016-02-05 (×5): qty 500

## 2016-02-05 MED ORDER — VANCOMYCIN HCL IN DEXTROSE 1-5 GM/200ML-% IV SOLN: 1000.0000 mg | Freq: Once | INTRAVENOUS | Status: DC

## 2016-02-05 MED ORDER — AZTREONAM 2 G IJ SOLR
2.0000 g | Freq: Once | INTRAMUSCULAR | Status: AC
  Administered 2016-02-05: 2 g via INTRAVENOUS
  Filled 2016-02-05: qty 2

## 2016-02-05 MED ORDER — SODIUM CHLORIDE 0.9 % IV BOLUS (SEPSIS)
1000.0000 mL | Freq: Once | INTRAVENOUS | Status: AC
  Administered 2016-02-05: 1000 mL via INTRAVENOUS

## 2016-02-05 MED ORDER — FENTANYL CITRATE (PF) 100 MCG/2ML IJ SOLN
50.0000 ug | Freq: Once | INTRAMUSCULAR | Status: AC
  Administered 2016-02-05: 50 ug via INTRAVENOUS
  Filled 2016-02-05: qty 2

## 2016-02-05 NOTE — Telephone Encounter (Signed)
Linford ArnoldJerilynn is the physical therapist, and concerns because of the respiratory rate.Charm Rings.Erica R Taylor

## 2016-02-05 NOTE — ED Notes (Signed)
Pt. Stated, I also have a splitting headache.

## 2016-02-05 NOTE — Telephone Encounter (Signed)
Spoke with pt. Because it is after-hours and [facility] But informed pt. Of MW message, she stated that she will call us if she decides to come in to get an appointment. At this time nothing further is needed at this time.

## 2016-02-05 NOTE — Telephone Encounter (Signed)
Nothing else to suggest over the phone - if condition worsens go to ER, if not try to get her in as a work in to see me or Tammy NP

## 2016-02-05 NOTE — Telephone Encounter (Signed)
[facility] Pt having a lot of PND, cough and elevated Resp rate (30 at rest). Taking Robitussin for cough.  Cough is very wet sounding but no congestion heard in lungs.  Still taking abx and prednisone as prescribed.  Please advise Dr Sherene SiresWert. Thanks.

## 2016-02-05 NOTE — ED Provider Notes (Signed)
[facility]-EMERGENCY DEPT Provider Note   CSN: [id number] Arrival date & time: 02/05/16  1830     History   Chief Complaint Chief Complaint  Patient presents with  . Shortness of Breath  . Cough    HPI Kim [name] is a 75 y.o. female.  Patient is a 75 year old female with a history of diabetes, hypertension and hyperlipidemia. She also has a diagnosis of interstitial lung disease and is followed by Dr. Sherene Sires with pulmonology. She was recently admitted for pneumonia and was discharged on December 3. She was discharged on Levaquin. She says that she still taking that. She states that she's continued to get worse since discharge. She has ongoing shortness of breath and cough. She denies any known fevers. She denies any chest pain. She denies any leg swelling. She was discharged on oxygen and she uses it continuously at 2 L/m.      Past Medical History:  Diagnosis Date  . Constipation due to pain medication   . Diabetes mellitus without complication (HCC)   . GERD (gastroesophageal reflux disease)   . Hyperlipemia   . Hypertension   . Postinflammatory pulmonary fibrosis (HCC)   . Seasonal allergies     Patient Active Problem List   Diagnosis Date Noted  . Acute respiratory failure with hypoxia (HCC) 01/25/2016  . Hypothyroidism 01/25/2016  . Atypical pneumonia 01/25/2016  . 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 Pure hypercholesterolemia 07/29/2012  . Acute pharyngitis 05/21/2012  . Diabetes mellitus type 2 in obese (HCC) 01/16/2010  . HTN (hypertension) 01/16/2010  . Upper airway cough syndrome 01/12/2010    Past Surgical History:  Procedure Laterality Date  . ABDOMINAL HYSTERECTOMY    . COLONOSCOPY    . HERNIA REPAIR     X 2  . SMALL INTESTINE SURGERY    . TOTAL KNEE ARTHROPLASTY Right 07/06/2014   Procedure: RIGHT TOTAL KNEE ARTHROPLASTY;  Surgeon: Mckinley Jewel, MD;  Location: [facility] OR;   Service: Orthopedics;  Laterality: Right;    OB History    No data available       Home Medications    Prior to Admission medications   Medication Sig Start Date End Date Taking? Authorizing Provider  acetaminophen (TYLENOL) 500 MG tablet Take 500 mg by mouth daily.    Yes Historical Provider, MD  aspirin EC 81 MG tablet Take 81 mg by mouth daily.   Yes Historical Provider, MD  famotidine (PEPCID) 20 MG tablet Take 1 tablet (20 mg total) by mouth at bedtime. 12/20/15  Yes Nyoka Cowden, MD  guaiFENesin-codeine [facility]) 100-10 MG/5ML syrup Take 5-10 mLs by mouth every 8 (eight) hours as needed for cough.   Yes Historical Provider, MD  levothyroxine (SYNTHROID, LEVOTHROID) 50 MCG tablet Take 50 mcg by mouth daily before breakfast.   Yes Historical Provider, MD  losartan-hydrochlorothiazide (HYZAAR) 100-12.5 MG per tablet Take 1 tablet by mouth daily.   Yes Historical Provider, MD  megestrol (MEGACE) 40 MG/ML suspension Take 200 mg by mouth daily.   Yes Historical Provider, MD  metFORMIN (GLUCOPHAGE) 500 MG tablet TAKE 1 TABLET BY MOUTH TWICE A DAY 03/31/13  Yes Eulis Foster, FNP  pantoprazole (PROTONIX) 40 MG tablet Take 1 tablet (40 mg total) by mouth daily. Take 30-60 min before first meal of the day 09/21/15  Yes Nyoka Cowden, MD  predniSONE (DELTASONE) 10 MG tablet Take 4 tabs for 3 days, then 3 tabs  for 3 days, then 2 tabs for 3 days, then 1 tab for 3 days, then 1/2 tab for 4 days. 01/28/16  Yes Jennifer Chahn-Yang Choi, DO  levofloxacin (LEVAQUIN) 500 MG tablet Take 500 mg by mouth daily. 01/28/16   Historical Provider, MD    Family History Family History  Problem Relation Age of Onset  . Stomach cancer Sister   . Heart attack Sister     Social History Social History  Substance Use Topics  . Smoking status: Former Smoker    Packs/day: 0.50    Years: 10.00    Types: Cigarettes    Quit date: 02/26/1988  . Smokeless tobacco: Never Used  . Alcohol use No      Allergies   Codeine and Penicillins   Review of Systems Review of Systems  Constitutional: Positive for fatigue. Negative for chills, diaphoresis and fever.  HENT: Positive for congestion. Negative for rhinorrhea and sneezing.   Eyes: Negative.   Respiratory: Positive for cough and shortness of breath. Negative for chest tightness.   Cardiovascular: Negative for chest pain and leg swelling.  Gastrointestinal: Negative for abdominal pain, blood in stool, diarrhea, nausea and vomiting.  Genitourinary: Negative for difficulty urinating, flank pain, frequency and hematuria.  Musculoskeletal: Positive for back pain. Negative for arthralgias.  Skin: Negative for rash.  Neurological: Negative for dizziness, speech difficulty, weakness, numbness and headaches.     Physical Exam Updated Vital Signs BP 141/81   Pulse (!) 122   Temp 98.2 F (36.8 C) (Oral)   Resp (!) 33   Ht 5\' 9"  (1.753 m)   Wt 142 lb (64.4 kg)   SpO2 97%   BMI 20.97 kg/m   Physical Exam  Constitutional: She is oriented to person, place, and time. She appears well-developed and well-nourished. She appears distressed.  HENT:  Head: Normocephalic and atraumatic.  Eyes: Pupils are equal, round, and reactive to light.  Neck: Normal range of motion. Neck supple.  Cardiovascular: Regular rhythm and normal heart sounds.  Tachycardia present.   Pulmonary/Chest: Accessory muscle usage present. Tachypnea noted. No respiratory distress. She has no wheezes. She has rhonchi. She has rales. She exhibits no tenderness.  Abdominal: Soft. Bowel sounds are normal. There is no tenderness. There is no rebound and no guarding.  Musculoskeletal: Normal range of motion. She exhibits no edema.  Lymphadenopathy:    She has no cervical adenopathy.  Neurological: She is alert and oriented to person, place, and time.  Skin: Skin is warm and dry. No rash noted.  Psychiatric: She has a normal mood and affect.     ED Treatments /  Results  Labs (all labs ordered are listed, but only abnormal results are displayed) Labs Reviewed  COMPREHENSIVE METABOLIC PANEL - Abnormal; Notable for the following:       Result Value   Chloride 100 (*)    Calcium 8.8 (*)    Albumin 3.1 (*)    ALT 13 (*)    All other components within normal limits  CBC WITH DIFFERENTIAL/PLATELET - Abnormal; Notable for the following:    WBC 28.5 (*)    RDW 16.2 (*)    Platelets 436 (*)    Neutro Abs 20.5 (*)    Lymphs Abs 5.4 (*)    Monocytes Absolute 1.7 (*)    Eosinophils Absolute 0.9 (*)    All other components within normal limits  URINALYSIS, ROUTINE W REFLEX MICROSCOPIC - Abnormal; Notable for the following:    APPearance HAZY (*)  Leukocytes, UA LARGE (*)    Squamous Epithelial / LPF 0-5 (*)    Non Squamous Epithelial 0-5 (*)    All other components within normal limits  I-STAT CG4 LACTIC ACID, ED - Abnormal; Notable for the following:    Lactic Acid, Venous 2.60 (*)    All other components within normal limits  I-STAT ARTERIAL BLOOD GAS, ED - Abnormal; Notable for the following:    pH, Arterial 7.469 (*)    pO2, Arterial 71.0 (*)    Acid-Base Excess 4.0 (*)    All other components within normal limits  I-STAT CG4 LACTIC ACID, ED - Abnormal; Notable for the following:    Lactic Acid, Venous 2.44 (*)    All other components within normal limits  CULTURE, BLOOD (ROUTINE X 2)  CULTURE, BLOOD (ROUTINE X 2)  URINE CULTURE  BRAIN NATRIURETIC PEPTIDE  BLOOD GAS, ARTERIAL  I-STAT TROPOININ, ED    EKG  EKG Interpretation  Date/Time:  Monday February 05 2016 19:04:26 EST Ventricular Rate:  139 PR Interval:  138 QRS Duration: 68 QT Interval:  272 QTC Calculation: 413 R Axis:   -11 Text Interpretation:  Sinus tachycardia Possible Left atrial enlargement Borderline ECG Confirmed by Buelah Rennie  MD, Ketura Sirek ([id number]) on 02/05/2016 7:36:36 PM       Radiology Dg Chest Portable 1 View  Result Date: 02/05/2016 CLINICAL DATA:   Shortness of breath and right-sided back pain for 2 weeks. EXAM: PORTABLE CHEST 1 VIEW COMPARISON:  Chest x-ray a 01/25/2016 and chest CT 01/24/2014 FINDINGS: Chronic severe interstitial lung disease without definite superimposed acute pulmonary process. No pleural effusions. The heart is normal in size stable. IMPRESSION: Severe chronic lung disease without definite acute overlying pulmonary process. Electronically Signed   By: Rudie Meyer M.D.   On: 02/05/2016 20:39   Ct Renal Stone Study  Result Date: 02/05/2016 CLINICAL DATA:  Right flank pain EXAM: CT ABDOMEN AND PELVIS WITHOUT CONTRAST TECHNIQUE: Multidetector CT imaging of the abdomen and pelvis was performed following the standard protocol without IV contrast. COMPARISON:  10/03/2014 FINDINGS: Lower chest: Respiratory motion artifact limits evaluation of lung base parenchyma. Extensive fibrosis is again visualized at the bilateral lung bases. Possible ground-glass density at the right greater than left lung base could relate to acute superimposed inflammation or infection. No large pleural effusion. Heart size nonenlarged. Coronary artery calcifications. Hepatobiliary: No biliary dilatation. No focal hepatic abnormality. Punctate calcifications within the gallbladder and possibly within the wall of the gallbladder. No wall thickening. Pancreas: Unremarkable. No pancreatic ductal dilatation or surrounding inflammatory changes. Spleen: Normal in size without focal abnormality. Adrenals/Urinary Tract: Adrenal glands are within normal limits. Low-density foci in the right kidney compatible with cysts, unchanged. No hydronephrosis. No calcified stones along the course of the ureter. Bladder is unremarkable. Stomach/Bowel: Stomach is nonenlarged. No dilated small bowel. Large amount of stool throughout the colon. Appendix not well identified. Moderate to large retained feces in the rectum Vascular/Lymphatic: Aortic atherosclerosis. No enlarged abdominal or  pelvic lymph nodes. Reproductive: Status post hysterectomy. No adnexal masses. Other: No free air or free fluid. Numerous surgical clips adjacent to the distal aorta and along the iliac vessels. Musculoskeletal: Degenerative changes. No acute osseous abnormality. Partially calcified fatty mass within the anterior abdominal wall fat again visualized and unchanged. IMPRESSION: 1. Respiratory motion artifact limits the exam. Extensive fibrosis and honeycombing within the bilateral lung bases. Suspect that there is mild superimposed ground-glass density suggesting acute inflammation or infection on chronic change. No effusion. 2.  No evidence for renal stone or hydronephrosis. 3. Peripherally oriented gallbladder calcifications suggests gallbladder wall calcification as opposed to intraluminal stones. No biliary dilatation. Electronically Signed   By: Jasmine Pang M.D.   On: 02/05/2016 23:35    Procedures Procedures (including critical care time)  Medications Ordered in ED Medications  aztreonam (AZACTAM) 1 g in dextrose 5 % 50 mL IVPB (not administered)  vancomycin (VANCOCIN) 500 mg in sodium chloride 0.9 % 100 mL IVPB (not administered)  aztreonam (AZACTAM) 2 g in dextrose 5 % 50 mL IVPB (0 g Intravenous Stopped 02/05/16 2119)  vancomycin (VANCOCIN) IVPB 1000 mg/200 mL premix (0 mg Intravenous Stopped 02/05/16 2322)  sodium chloride 0.9 % bolus 1,000 mL (0 mLs Intravenous Stopped 02/05/16 2322)  fentaNYL (SUBLIMAZE) injection 50 mcg (50 mcg Intravenous Given 02/05/16 2205)     Initial Impression / Assessment and Plan / ED Course  I have reviewed the triage vital signs and the nursing notes.  Pertinent labs & imaging results that were available during my care of the patient were reviewed by me and considered in my medical decision making (see chart for details).  Clinical Course as of Feb 05 2352  Mon Feb 05, 2016  2021 Patient noted to be tachypneic and tachycardic. Code sepsis was initiated.  Her lactate is noted to be 2.6. She was started on IV fluids and antibiotics for possible healthcare associated pneumonia. Chest x-ray pending.  [MB]  2117 Pt's x-ray looks non-concerning.  No evidence of pneumonia.  Still Tachycardic and tachypneic.  She has complaints of pain in her right lower back that radiates to her right lower abdomen. Given her elevated lactate and WBC count, I will go ahead and check a CT of abdomen and pelvis. She's had no  episodes of hypotension. She is maintaining oxygen saturations of 100% at her baseline 2 L/m of oxygen. She reports shortness of breath but states it's the same as it was when she was in the hospital 2 weeks ago. She had a CT scan of her chest which did not show any evidence of pulmonary emboli within the last 2 weeks.  [MB]    Clinical Course User Index [MB] Rolan Bucco, MD    Patient presents with marked shortness of breath tachypnea and tachycardia. Chest x-ray doesn't show any evidence of focal pneumonia. She had a CT scan that was negative for PE within the last 2 weeks a low suspicion for PE. She did have some associated abdominal pain and I did do a CT of her abdomen which was negative for acute disease. She has improved in the ED. Her heart rate has come down to the 110s. Her respiratory rate is still in the upper 20s to low 30s. Her oxygen saturation saturations are 97% on her nasal cannula 2 L. She's feeling better. Her abdominal CT showed some increased inflammatory changes in the lungs. I suspect she's got worsening pneumonia. She has a markedly elevated white count and an elevated lactate. She was given 1 L IV fluids but was not given referral 30 cc/kg given that she's had no episodes of hypotension and her lactate is not greater than 4. She was started on broad-spectrum antibiotics.  I have consulted the hospitalist for admission.  I spoke with Dr. Toniann Fail who will admit the pt.  Final Clinical Impressions(s) / ED Diagnoses   Final  diagnoses:  Shortness of breath  HCAP (healthcare-associated pneumonia)  Sepsis, due to unspecified organism [facility])    New Prescriptions New  Prescriptions   No medications on file     Rolan BuccoMelanie Huntley Knoop, MD 02/06/16 [id number]

## 2016-02-05 NOTE — Progress Notes (Signed)
ABG drawn while patient wearing oxygen set at 2lpm,

## 2016-02-05 NOTE — ED Triage Notes (Signed)
Pt. Stated, I started last night breathing hard and real SOB and a cough.  I was discharged from the hospital last Sunday with pneumonia .  I started getting worse last night.  Trying to encourage pt. To slow down her breathing .

## 2016-02-05 NOTE — Progress Notes (Signed)
ANTIBIOTIC CONSULT NOTE - INITIAL  Pharmacy Consult for Vanco/Aztreonam Indication: sepsis  Allergies  Allergen Reactions  . Codeine Nausea And Vomiting    "Takes my appetite"  . Penicillins Rash    Has patient had a PCN reaction causing immediate rash, facial/tongue/throat swelling, SOB or lightheadedness with hypotension: Yes Has patient had a PCN reaction causing severe rash involving mucus membranes or skin necrosis: No Has patient had a PCN reaction that required hospitalization No Has patient had a PCN reaction occurring within the last 10 years: No If all of the above answers are "NO", then may proceed with Cephalosporin use.     Patient Measurements: Height: 5\' 9"  (175.3 cm) Weight: 142 lb (64.4 kg) IBW/kg (Calculated) : 66.2 Adjusted Body Weight:    Vital Signs: Temp: 98.2 F (36.8 C) (12/11 1920) Temp Source: Oral (12/11 1920) BP: 159/66 (12/11 2100) Pulse Rate: 133 (12/11 2100) Intake/Output from previous day: No intake/output data recorded. Intake/Output from this shift: No intake/output data recorded.  Labs:  Recent Labs  02/05/16 1913  WBC 28.5*  HGB 13.4  PLT 436*  CREATININE 0.91   Estimated Creatinine Clearance: 55.1 mL/min (by C-G formula based on SCr of 0.91 mg/dL). No results for input(s): VANCOTROUGH, VANCOPEAK, VANCORANDOM, GENTTROUGH, GENTPEAK, GENTRANDOM, TOBRATROUGH, TOBRAPEAK, TOBRARND, AMIKACINPEAK, AMIKACINTROU, AMIKACIN in the last 72 hours.   Microbiology:   Medical History: Past Medical History:  Diagnosis Date  . Constipation due to pain medication   . Diabetes mellitus without complication (HCC)   . GERD (gastroesophageal reflux disease)   . Hyperlipemia   . Hypertension   . Postinflammatory pulmonary fibrosis (HCC)   . Seasonal allergies     Medications:  F/u med rec  Assessment: 75 y/o M presents with SOB and cough. Recent discharged from hospital with PNA. Afebrile. BP ok HR 153, elevated WBC.  PMH: HTN, low  thyroid, DM, HLD, GERD, PF, arthritis of knee  ID: Sepsis, recent hospitalization for PNA. LA 2.6, WBC 28.5 Antimicrobials this admission:  Aztreonam 12/11>> Vanco 12/11>>  Goal of Therapy:  Vancomycin trough level 15-20 mcg/ml  Plan:  Aztreonam 2g IV x 1 in ED then 1g IV q8hr. Vancomycin 1g IV x 1 in ED then 500mg  IV q12h Vanco trough after 3-5 doses at steady state.   Kim [name], PharmD, BCPS Clinical Staff Pharmacist Pager [phone]  Misty Stanleyobertson, Kim [name] 02/05/2016,9:24 PM

## 2016-02-06 ENCOUNTER — Inpatient Hospital Stay (HOSPITAL_COMMUNITY): Payer: Medicare Other

## 2016-02-06 ENCOUNTER — Encounter (HOSPITAL_COMMUNITY): Payer: Self-pay | Admitting: Internal Medicine

## 2016-02-06 DIAGNOSIS — R0602 Shortness of breath: Secondary | ICD-10-CM | POA: Diagnosis present

## 2016-02-06 DIAGNOSIS — K219 Gastro-esophageal reflux disease without esophagitis: Secondary | ICD-10-CM | POA: Diagnosis present

## 2016-02-06 DIAGNOSIS — R05 Cough: Secondary | ICD-10-CM

## 2016-02-06 DIAGNOSIS — E039 Hypothyroidism, unspecified: Secondary | ICD-10-CM

## 2016-02-06 DIAGNOSIS — I10 Essential (primary) hypertension: Secondary | ICD-10-CM | POA: Diagnosis not present

## 2016-02-06 DIAGNOSIS — J9611 Chronic respiratory failure with hypoxia: Secondary | ICD-10-CM | POA: Diagnosis present

## 2016-02-06 DIAGNOSIS — Y95 Nosocomial condition: Secondary | ICD-10-CM | POA: Diagnosis present

## 2016-02-06 DIAGNOSIS — I2510 Atherosclerotic heart disease of native coronary artery without angina pectoris: Secondary | ICD-10-CM | POA: Diagnosis present

## 2016-02-06 DIAGNOSIS — I700 Atherosclerosis of aorta: Secondary | ICD-10-CM | POA: Diagnosis present

## 2016-02-06 DIAGNOSIS — Z79899 Other long term (current) drug therapy: Secondary | ICD-10-CM | POA: Diagnosis not present

## 2016-02-06 DIAGNOSIS — E119 Type 2 diabetes mellitus without complications: Secondary | ICD-10-CM | POA: Diagnosis present

## 2016-02-06 DIAGNOSIS — J189 Pneumonia, unspecified organism: Secondary | ICD-10-CM | POA: Diagnosis present

## 2016-02-06 DIAGNOSIS — A419 Sepsis, unspecified organism: Secondary | ICD-10-CM | POA: Diagnosis present

## 2016-02-06 DIAGNOSIS — Z8249 Family history of ischemic heart disease and other diseases of the circulatory system: Secondary | ICD-10-CM | POA: Diagnosis not present

## 2016-02-06 DIAGNOSIS — E669 Obesity, unspecified: Secondary | ICD-10-CM

## 2016-02-06 DIAGNOSIS — Z87891 Personal history of nicotine dependence: Secondary | ICD-10-CM | POA: Diagnosis not present

## 2016-02-06 DIAGNOSIS — E1169 Type 2 diabetes mellitus with other specified complication: Secondary | ICD-10-CM

## 2016-02-06 DIAGNOSIS — R768 Other specified abnormal immunological findings in serum: Secondary | ICD-10-CM | POA: Diagnosis not present

## 2016-02-06 DIAGNOSIS — K828 Other specified diseases of gallbladder: Secondary | ICD-10-CM

## 2016-02-06 DIAGNOSIS — Z7984 Long term (current) use of oral hypoglycemic drugs: Secondary | ICD-10-CM | POA: Diagnosis not present

## 2016-02-06 DIAGNOSIS — Z7982 Long term (current) use of aspirin: Secondary | ICD-10-CM | POA: Diagnosis not present

## 2016-02-06 DIAGNOSIS — J9621 Acute and chronic respiratory failure with hypoxia: Secondary | ICD-10-CM | POA: Diagnosis present

## 2016-02-06 DIAGNOSIS — Z96651 Presence of right artificial knee joint: Secondary | ICD-10-CM | POA: Diagnosis present

## 2016-02-06 DIAGNOSIS — Z880 Allergy status to penicillin: Secondary | ICD-10-CM | POA: Diagnosis not present

## 2016-02-06 DIAGNOSIS — J8410 Pulmonary fibrosis, unspecified: Secondary | ICD-10-CM

## 2016-02-06 DIAGNOSIS — J210 Acute bronchiolitis due to respiratory syncytial virus: Secondary | ICD-10-CM | POA: Diagnosis present

## 2016-02-06 DIAGNOSIS — J9601 Acute respiratory failure with hypoxia: Secondary | ICD-10-CM | POA: Diagnosis not present

## 2016-02-06 DIAGNOSIS — J302 Other seasonal allergic rhinitis: Secondary | ICD-10-CM | POA: Diagnosis present

## 2016-02-06 DIAGNOSIS — Z7952 Long term (current) use of systemic steroids: Secondary | ICD-10-CM | POA: Diagnosis not present

## 2016-02-06 DIAGNOSIS — J84112 Idiopathic pulmonary fibrosis: Secondary | ICD-10-CM | POA: Diagnosis present

## 2016-02-06 DIAGNOSIS — E785 Hyperlipidemia, unspecified: Secondary | ICD-10-CM | POA: Diagnosis present

## 2016-02-06 HISTORY — DX: Other specified diseases of gallbladder: K82.8

## 2016-02-06 LAB — CBC WITH DIFFERENTIAL/PLATELET
BASOS PCT: 0 %
Basophils Absolute: 0 10*3/uL (ref 0.0–0.1)
Eosinophils Absolute: 0.2 10*3/uL (ref 0.0–0.7)
Eosinophils Relative: 1 %
HEMATOCRIT: 34.9 % — AB (ref 36.0–46.0)
Hemoglobin: 11.1 g/dL — ABNORMAL LOW (ref 12.0–15.0)
LYMPHS PCT: 9 %
Lymphs Abs: 2.1 10*3/uL (ref 0.7–4.0)
MCH: 26.4 pg (ref 26.0–34.0)
MCHC: 31.8 g/dL (ref 30.0–36.0)
MCV: 82.9 fL (ref 78.0–100.0)
MONO ABS: 0.3 10*3/uL (ref 0.1–1.0)
MONOS PCT: 1 %
NEUTROS ABS: 19.9 10*3/uL — AB (ref 1.7–7.7)
Neutrophils Relative %: 89 %
Platelets: 388 10*3/uL (ref 150–400)
RBC: 4.21 MIL/uL (ref 3.87–5.11)
RDW: 16.6 % — AB (ref 11.5–15.5)
WBC: 22.5 10*3/uL — ABNORMAL HIGH (ref 4.0–10.5)

## 2016-02-06 LAB — COMPREHENSIVE METABOLIC PANEL
ALK PHOS: 42 U/L (ref 38–126)
ALT: 10 U/L — AB (ref 14–54)
ANION GAP: 8 (ref 5–15)
AST: 21 U/L (ref 15–41)
Albumin: 2.5 g/dL — ABNORMAL LOW (ref 3.5–5.0)
BUN: 15 mg/dL (ref 6–20)
CALCIUM: 7.9 mg/dL — AB (ref 8.9–10.3)
CO2: 25 mmol/L (ref 22–32)
Chloride: 101 mmol/L (ref 101–111)
Creatinine, Ser: 0.97 mg/dL (ref 0.44–1.00)
GFR calc Af Amer: >60 mL/min (ref >60)
GFR calc non Af Amer: 56 mL/min — ABNORMAL LOW (ref >60)
GLUCOSE: 185 mg/dL — AB (ref 65–99)
Potassium: 4.4 mmol/L (ref 3.5–5.1)
SODIUM: 134 mmol/L — AB (ref 135–145)
Total Bilirubin: 0.3 mg/dL (ref 0.3–1.2)
Total Protein: 5.4 g/dL — ABNORMAL LOW (ref 6.5–8.1)

## 2016-02-06 LAB — SEDIMENTATION RATE: SED RATE: 35 mm/h — AB (ref 0–22)

## 2016-02-06 LAB — GLUCOSE, CAPILLARY
GLUCOSE-CAPILLARY: 158 mg/dL — AB (ref 65–99)
GLUCOSE-CAPILLARY: 127 mg/dL — AB (ref 65–99)
Glucose-Capillary: 297 mg/dL — ABNORMAL HIGH (ref 65–99)
Glucose-Capillary: 181 mg/dL — ABNORMAL HIGH (ref 65–99)

## 2016-02-06 LAB — MRSA PCR SCREENING: MRSA BY PCR: NEGATIVE

## 2016-02-06 LAB — LACTIC ACID, PLASMA
Lactic Acid, Venous: 2.7 mmol/L (ref 0.5–1.9)
Lactic Acid, Venous: 3.1 mmol/L (ref 0.5–1.9)

## 2016-02-06 LAB — TSH: TSH: 0.779 u[IU]/mL (ref 0.350–4.500)

## 2016-02-06 LAB — PROCALCITONIN
Procalcitonin: 0.12 ng/mL
Procalcitonin: 0.17 ng/mL

## 2016-02-06 LAB — CBG MONITORING, ED: Glucose-Capillary: 300 mg/dL — ABNORMAL HIGH (ref 65–99)

## 2016-02-06 MED ORDER — PANTOPRAZOLE SODIUM 40 MG PO TBEC
40.0000 mg | DELAYED_RELEASE_TABLET | Freq: Every day | ORAL | Status: DC
  Administered 2016-02-06 – 2016-02-09 (×4): 40 mg via ORAL
  Filled 2016-02-06 (×4): qty 1

## 2016-02-06 MED ORDER — LOSARTAN POTASSIUM 50 MG PO TABS
100.0000 mg | ORAL_TABLET | Freq: Every day | ORAL | Status: DC
  Administered 2016-02-06: 100 mg via ORAL
  Filled 2016-02-06: qty 2

## 2016-02-06 MED ORDER — ACETAMINOPHEN 325 MG PO TABS
650.0000 mg | ORAL_TABLET | Freq: Four times a day (QID) | ORAL | Status: DC | PRN
Start: 2016-02-06 — End: 2016-02-09
  Administered 2016-02-08 – 2016-02-09 (×2): 650 mg via ORAL
  Filled 2016-02-06 (×2): qty 2

## 2016-02-06 MED ORDER — ACETAMINOPHEN 650 MG RE SUPP: 650.0000 mg | Freq: Four times a day (QID) | RECTAL | Status: DC | PRN

## 2016-02-06 MED ORDER — FAMOTIDINE 20 MG PO TABS
20.0000 mg | ORAL_TABLET | Freq: Every day | ORAL | Status: DC
  Administered 2016-02-06 – 2016-02-08 (×3): 20 mg via ORAL
  Filled 2016-02-06 (×3): qty 1

## 2016-02-06 MED ORDER — METHYLPREDNISOLONE SODIUM SUCC 125 MG IJ SOLR
125.0000 mg | Freq: Once | INTRAMUSCULAR | Status: AC
  Administered 2016-02-06: 125 mg via INTRAVENOUS
  Filled 2016-02-06: qty 2

## 2016-02-06 MED ORDER — ONDANSETRON HCL 4 MG PO TABS: 4.0000 mg | ORAL_TABLET | Freq: Four times a day (QID) | ORAL | Status: DC | PRN

## 2016-02-06 MED ORDER — ONDANSETRON HCL 4 MG/2ML IJ SOLN: 4.0000 mg | Freq: Four times a day (QID) | INTRAMUSCULAR | Status: DC | PRN

## 2016-02-06 MED ORDER — SODIUM CHLORIDE 0.9 % IV SOLN
INTRAVENOUS | Status: AC
  Administered 2016-02-06: 06:00:00 via INTRAVENOUS

## 2016-02-06 MED ORDER — INSULIN ASPART 100 UNIT/ML ~~LOC~~ SOLN
0.0000 [IU] | Freq: Three times a day (TID) | SUBCUTANEOUS | Status: DC
  Administered 2016-02-06: 5 [IU] via SUBCUTANEOUS
  Administered 2016-02-06: 2 [IU] via SUBCUTANEOUS
  Administered 2016-02-06: 5 [IU] via SUBCUTANEOUS
  Administered 2016-02-07: 9 [IU] via SUBCUTANEOUS
  Administered 2016-02-07: 2 [IU] via SUBCUTANEOUS
  Administered 2016-02-08: 5 [IU] via SUBCUTANEOUS
  Administered 2016-02-08 – 2016-02-09 (×2): 2 [IU] via SUBCUTANEOUS
  Filled 2016-02-06: qty 1

## 2016-02-06 MED ORDER — ASPIRIN EC 81 MG PO TBEC
81.0000 mg | DELAYED_RELEASE_TABLET | Freq: Every day | ORAL | Status: DC
  Administered 2016-02-06 – 2016-02-09 (×4): 81 mg via ORAL
  Filled 2016-02-06 (×4): qty 1

## 2016-02-06 MED ORDER — METHYLPREDNISOLONE SODIUM SUCC 40 MG IJ SOLR
40.0000 mg | Freq: Every day | INTRAMUSCULAR | Status: DC
  Administered 2016-02-06 – 2016-02-07 (×2): 40 mg via INTRAVENOUS
  Filled 2016-02-06 (×2): qty 1

## 2016-02-06 MED ORDER — AZTREONAM 2 G IJ SOLR: 2.0000 g | Freq: Three times a day (TID) | INTRAMUSCULAR | Status: DC

## 2016-02-06 MED ORDER — LEVOTHYROXINE SODIUM 50 MCG PO TABS
50.0000 ug | ORAL_TABLET | Freq: Every day | ORAL | Status: DC
  Administered 2016-02-06 – 2016-02-09 (×4): 50 ug via ORAL
  Filled 2016-02-06 (×5): qty 1

## 2016-02-06 MED ORDER — MEGESTROL ACETATE 40 MG/ML PO SUSP
200.0000 mg | Freq: Every day | ORAL | Status: DC
  Administered 2016-02-07 – 2016-02-09 (×3): 200 mg via ORAL
  Filled 2016-02-06 (×4): qty 5

## 2016-02-06 MED ORDER — ENOXAPARIN SODIUM 40 MG/0.4ML ~~LOC~~ SOLN
40.0000 mg | Freq: Every day | SUBCUTANEOUS | Status: DC
  Administered 2016-02-07: 40 mg via SUBCUTANEOUS
  Filled 2016-02-06 (×2): qty 0.4

## 2016-02-06 MED ORDER — SODIUM CHLORIDE 0.9 % IV BOLUS (SEPSIS)
1000.0000 mL | Freq: Once | INTRAVENOUS | Status: AC
  Administered 2016-02-06: 1000 mL via INTRAVENOUS

## 2016-02-06 NOTE — ED Notes (Signed)
[facility] contacted for vanc.

## 2016-02-06 NOTE — ED Notes (Signed)
Dr. Darnelle Catalanama paged for lab results.

## 2016-02-06 NOTE — ED Notes (Signed)
Dr. Darnelle Catalanama contacted with increased lactic.

## 2016-02-06 NOTE — Progress Notes (Signed)
Progress Note    Kim [name]  MVH:[id number] DOB: [birth year]  DOA: 02/05/2016 PCP: Geraldo PitterBLAND,VEITA J, MD    Brief Narrative:   Chief complaint: Follow-up sepsis  Kim [name] is an 75 y.o. female with a PMH of interstitial lung disease, recent hospitalization for treatment of dyspnea secondary to pneumonia, discharged on a prednisone taper, who was readmitted 02/05/16 with worsening shortness of breath. In the ED, the patient was found to be tachycardic with elevated serum lactate. CT of the abdomen showed an incidentally discovered calcified call bladder with worsening infiltrates in the lower parts of the lungs.  Assessment/Plan:   Principal Problem:   Sepsis (HCC) secondary to HCAP Patient was given fluid volume resuscitation in the ED. She had persistent elevation of serum lactate this morning, so was given an additional 1 L bolus of fluids. Continue aztreonam and vancomycin. Follow-up blood cultures. Re-evaluated patient at the bedside after being called by the nurse due to persistent tachypnea, tachycardia and hypotension. Pro calcitonin 0.12.  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 LFTs okay. We'll hold off on HIDA scan for now given patient's tachypnea and hypotension. Spoke with Lianne BushySarah Gribbon about whether or not this needs to be evaluated emergently given possible recurrent sepsis as the potential etiology.  Abdominal exam unimpressive.    Diabetes mellitus type 2 in obese [facility](HCC) Currently being managed with insulin sensitive SSI 3 times a day. Monitor for glycemic control.    HTN (hypertension) Hold antihypertensives. Was hypotensive this morning.    Upper airway cough syndrome/Postinflammatory pulmonary fibrosis (history of)/acute respiratory failure with hypoxia Pulmonology consultation pending. Continue Solu-Medrol. Spoke with Dr. Craige CottaSood and Dr. Arsenio LoaderSommer about the patient's condition as no consult note has yet been  written as of 3:15 pm.  Dr. Arsenio LoaderSommer will investigate.    Hypothyroidism Continue Synthroid.   Family Communication/Anticipated D/C date and plan/Code Status   DVT prophylaxis: Lovenox ordered. Code Status: Full Code.  Family Communication: No family currently at bedside. Disposition Plan: Home when respiratory status stable, likely several more days.   Medical Consultants:    Pulmonology   Procedures:    None  Anti-Infectives:    Aztreonam 02/06/16--->  Vancomycin 02/06/16--->  Subjective:   The patient reports that her shortness of breath is better. Review of symptoms is positive for cough with clear sputum and negative for abdominal pain, nausea or vomiting.  Objective:    Vitals:   02/06/16 1115 02/06/16 1130 02/06/16 1145 02/06/16 1227  BP: 123/64 120/61 143/57 136/73  Pulse: 104 99 88 98  Resp: (!) 34 26 (!) 27 (!) 28  Temp:    98.3 F (36.8 C)  TempSrc:    Oral  SpO2: 98% 99% 100% 100%  Weight:      Height:        Intake/Output Summary (Last 24 hours) at 02/06/16 1513 Last data filed at 02/05/16 2322  Gross per 24 hour  Intake             1200 ml  Output                0 ml  Net             1200 ml   Filed Weights   02/05/16 1909  Weight: 64.4 kg (142 lb)    Exam: General exam: Appears calm and comfortable.  Respiratory system: Diminished with tachypnea and increased respiratory effort. Cardiovascular system: S1 & S2 heard,  tachycardic rate, regular rhythm. No JVD,  rubs, gallops or clicks. No murmurs. Gastrointestinal system: Abdomen is nondistended, soft and nontender. No organomegaly or masses felt. Normal bowel sounds heard. Central nervous system: Alert and oriented. No focal neurological deficits. Extremities: No clubbing,  or cyanosis. No edema. Skin: No rashes, lesions or ulcers. Psychiatry: Judgement and insight appear normal. Mood & affect appropriate.   Data Reviewed:   I have personally reviewed following labs and imaging  studies:  Labs: Basic Metabolic Panel:  Recent Labs Lab 02/05/16 1913 02/06/16 0326  NA 137 134*  K 4.2 4.4  CL 100* 101  CO2 25 25  GLUCOSE 94 185*  BUN 16 15  CREATININE 0.91 0.97  CALCIUM 8.8* 7.9*   GFR Estimated Creatinine Clearance: 51.7 mL/min (by C-G formula based on SCr of 0.97 mg/dL). Liver Function Tests:  Recent Labs Lab 02/05/16 1913 02/06/16 0326  AST 21 21  ALT 13* 10*  ALKPHOS 50 42  BILITOT 0.3 0.3  PROT 6.5 5.4*  ALBUMIN 3.1* 2.5*   CBC:  Recent Labs Lab 02/05/16 1913 02/06/16 0326  WBC 28.5* 22.5*  NEUTROABS 20.5* 19.9*  HGB 13.4 11.1*  HCT 41.0 34.9*  MCV 82.3 82.9  PLT 436* 388   CBG:  Recent Labs Lab 02/06/16 0821 02/06/16 1232  GLUCAP 300* 158*   Thyroid function studies:  Recent Labs  02/06/16 0716  TSH 0.779   Sepsis Labs:  Recent Labs Lab 02/05/16 1913 02/05/16 1954 02/05/16 2206 02/06/16 0326 02/06/16 0703 02/06/16 0931  PROCALCITON  --   --   --   --  0.12  --   WBC 28.5*  --   --  22.5*  --   --   LATICACIDVEN  --  2.60* 2.44*  --  2.7* 3.1*    Microbiology Recent Results (from the past 240 hour(s))  Blood Culture (routine x 2)     Status: None (Preliminary result)   Collection Time: 02/05/16  7:43 PM  Result Value Ref Range Status   Specimen Description BLOOD RIGHT HAND  Final   Special Requests IN PEDIATRIC BOTTLE 1CC  Final   Culture NO GROWTH < 24 HOURS  Final   Report Status PENDING  Incomplete  Blood Culture (routine x 2)     Status: None (Preliminary result)   Collection Time: 02/05/16  8:43 PM  Result Value Ref Range Status   Specimen Description BLOOD LEFT HAND  Final   Special Requests BOTTLES DRAWN AEROBIC ONLY 6CC  Final   Culture NO GROWTH < 24 HOURS  Final   Report Status PENDING  Incomplete    Radiology: Dg Chest Portable 1 View  Result Date: 02/05/2016 CLINICAL DATA:  Shortness of breath and right-sided back pain for 2 weeks. EXAM: PORTABLE CHEST 1 VIEW COMPARISON:  Chest  x-ray a 01/25/2016 and chest CT 01/24/2014 FINDINGS: Chronic severe interstitial lung disease without definite superimposed acute pulmonary process. No pleural effusions. The heart is normal in size stable. IMPRESSION: Severe chronic lung disease without definite acute overlying pulmonary process. Electronically Signed   By: Rudie MeyerP.  Gallerani M.D.   On: 02/05/2016 20:39   Ct Renal Stone Study  Result Date: 02/05/2016 CLINICAL DATA:  Right flank pain EXAM: CT ABDOMEN AND PELVIS WITHOUT CONTRAST TECHNIQUE: Multidetector CT imaging of the abdomen and pelvis was performed following the standard protocol without IV contrast. COMPARISON:  10/03/2014 FINDINGS: Lower chest: Respiratory motion artifact limits evaluation of lung base parenchyma. Extensive fibrosis is again visualized at the bilateral  lung bases. Possible ground-glass density at the right greater than left lung base could relate to acute superimposed inflammation or infection. No large pleural effusion. Heart size nonenlarged. Coronary artery calcifications. Hepatobiliary: No biliary dilatation. No focal hepatic abnormality. Punctate calcifications within the gallbladder and possibly within the wall of the gallbladder. No wall thickening. Pancreas: Unremarkable. No pancreatic ductal dilatation or surrounding inflammatory changes. Spleen: Normal in size without focal abnormality. Adrenals/Urinary Tract: Adrenal glands are within normal limits. Low-density foci in the right kidney compatible with cysts, unchanged. No hydronephrosis. No calcified stones along the course of the ureter. Bladder is unremarkable. Stomach/Bowel: Stomach is nonenlarged. No dilated small bowel. Large amount of stool throughout the colon. Appendix not well identified. Moderate to large retained feces in the rectum Vascular/Lymphatic: Aortic atherosclerosis. No enlarged abdominal or pelvic lymph nodes. Reproductive: Status post hysterectomy. No adnexal masses. Other: No free air or free  fluid. Numerous surgical clips adjacent to the distal aorta and along the iliac vessels. Musculoskeletal: Degenerative changes. No acute osseous abnormality. Partially calcified fatty mass within the anterior abdominal wall fat again visualized and unchanged. IMPRESSION: 1. Respiratory motion artifact limits the exam. Extensive fibrosis and honeycombing within the bilateral lung bases. Suspect that there is mild superimposed ground-glass density suggesting acute inflammation or infection on chronic change. No effusion. 2. No evidence for renal stone or hydronephrosis. 3. Peripherally oriented gallbladder calcifications suggests gallbladder wall calcification as opposed to intraluminal stones. No biliary dilatation. Electronically Signed   By: Jasmine Pang M.D.   On: 02/05/2016 23:35    Medications:   . aspirin EC  81 mg Oral Daily  . aztreonam  1 g Intravenous Q8H  . enoxaparin (LOVENOX) injection  40 mg Subcutaneous Daily  . famotidine  20 mg Oral QHS  . insulin aspart  0-9 Units Subcutaneous TID WC  . levothyroxine  50 mcg Oral QAC breakfast  . megestrol  200 mg Oral Daily  . methylPREDNISolone (SOLU-MEDROL) injection  40 mg Intravenous Daily  . pantoprazole  40 mg Oral Daily  . vancomycin  500 mg Intravenous Q12H   Continuous Infusions: . sodium chloride 75 mL/hr at 02/06/16 0724   Additional critical care time spent: 30 minutes. Time in: 10:19 AM, time out 10:39 AM.   LOS: 0 days   Azeneth Carbonell  [facility] Pager [phone]. If unable to reach me by pager, please call my cell phone at [phone].  *Please refer to [email or web address], password [email or web address] to get updated schedule on who will round on this patient, as hospitalists switch teams weekly. If 7PM-7AM, please contact night-coverage at [email or web address], password [email or web address] for any overnight needs.  02/06/2016, 3:13 PM

## 2016-02-06 NOTE — Consult Note (Signed)
Name: Kim [name] MRN: [id number] DOB: 07/24/1940    ADMISSION DATE:  02/05/2016 CONSULTATION DATE:  12/12   REFERRING MD :  Dr. Darnelle Catalan  CHIEF COMPLAINT:  Dyspnea   BRIEF PATIENT DESCRIPTION:  75 year old with PMH of interstitial lung disease (Dr. Sherene Sires), DM, GERD, hyperlipemia, hypothymism, and HTN presents to ED on 12/11 with worsening SOB, right flank pain, and non-productive cough. In ED patient tachycardic with elevated lactate levels, CT abdomen shows calcified gallbladder with worsening infiltrates in the lower part of the lung. Worked up for PNA, given empiric antibiotics and IV solu-medrol.    SIGNIFICANT EVENTS  12/11 > Presents to ED with Dyspnea   STUDIES:  Echo 12/1 > G1DD EF 65-70% CXR 12/11 > Chronic lung disease, no acute findings.  CT Renal Stone 12/11 > Negative Stone, gallbladder wall calcification   HISTORY OF PRESENT ILLNESS:   Admitted from 11/30-12/3 with reported 2 weeks of dyspnea, worked up for atypical presentation of CAP with azithromax/rocephin then changed to Levaquin, also treated with IV solumedrol and then tapered to po prednisone. Patient was discharged with 2L [place]. Was seen in the outpatient pulmonary office on 12/5  PAST MEDICAL HISTORY :   has a past medical history of Constipation due to pain medication; Diabetes mellitus without complication (HCC); GERD (gastroesophageal reflux disease); Hyperlipemia; Hypertension; Postinflammatory pulmonary fibrosis (HCC); and Seasonal allergies.  has a past surgical history that includes Abdominal hysterectomy; Small intestine surgery; Hernia repair; Colonoscopy; and Total knee arthroplasty (Right, 07/06/2014). Prior to Admission medications   Medication Sig Start Date End Date Taking? Authorizing Provider  acetaminophen (TYLENOL) 500 MG tablet Take 500 mg by mouth daily.    Yes Historical Provider, MD  aspirin EC 81 MG tablet Take 81 mg by mouth daily.   Yes Historical Provider, MD  famotidine (PEPCID) 20 MG  tablet Take 1 tablet (20 mg total) by mouth at bedtime. 12/20/15  Yes Nyoka Cowden, MD  guaiFENesin-codeine [facility]) 100-10 MG/5ML syrup Take 5-10 mLs by mouth every 8 (eight) hours as needed for cough.   Yes Historical Provider, MD  levothyroxine (SYNTHROID, LEVOTHROID) 50 MCG tablet Take 50 mcg by mouth daily before breakfast.   Yes Historical Provider, MD  losartan-hydrochlorothiazide (HYZAAR) 100-12.5 MG per tablet Take 1 tablet by mouth daily.   Yes Historical Provider, MD  megestrol (MEGACE) 40 MG/ML suspension Take 200 mg by mouth daily.   Yes Historical Provider, MD  metFORMIN (GLUCOPHAGE) 500 MG tablet TAKE 1 TABLET BY MOUTH TWICE A DAY 03/31/13  Yes Eulis Foster, FNP  pantoprazole (PROTONIX) 40 MG tablet Take 1 tablet (40 mg total) by mouth daily. Take 30-60 min before first meal of the day 09/21/15  Yes Nyoka Cowden, MD  predniSONE (DELTASONE) 10 MG tablet Take 4 tabs for 3 days, then 3 tabs for 3 days, then 2 tabs for 3 days, then 1 tab for 3 days, then 1/2 tab for 4 days. 01/28/16  Yes Jennifer Chahn-Yang Choi, DO  levofloxacin (LEVAQUIN) 500 MG tablet Take 500 mg by mouth daily. 01/28/16   Historical Provider, MD   Allergies  Allergen Reactions  . Codeine Nausea And Vomiting    "Takes my appetite"  . Penicillins Rash    Has patient had a PCN reaction causing immediate rash, facial/tongue/throat swelling, SOB or lightheadedness with hypotension: Yes Has patient had a PCN reaction causing severe rash involving mucus membranes or skin necrosis: No Has patient had a PCN reaction that required hospitalization  No Has patient had a PCN reaction occurring within the last 10 years: No If all of the above answers are "NO", then may proceed with Cephalosporin use.     FAMILY HISTORY:  family history includes Heart attack in her sister; Stomach cancer in her sister. SOCIAL HISTORY:  reports that she quit smoking about 27 years ago. Her smoking use included Cigarettes. She has a  5.00 pack-year smoking history. She has never used smokeless tobacco. She reports that she does not drink alcohol or use drugs.  REVIEW OF SYSTEMS:  Positive in [place].  Constitutional: Negative for fever, chills, weight loss, malaise/fatigue and diaphoresis.  HENT: Negative for hearing loss, ear pain, nosebleeds, congestion, sore throat, neck pain, tinnitus and ear discharge.   Eyes: Negative for blurred vision, double vision, photophobia, pain, discharge and redness.  Respiratory: Negative for cough, hemoptysis, sputum production, shortness of breath, wheezing and stridor.   Cardiovascular: Negative for chest pain, palpitations, orthopnea, claudication, leg swelling and PND.  Gastrointestinal: Negative for heartburn, nausea, vomiting, abdominal pain, diarrhea, constipation, blood in stool and melena.  Genitourinary: Negative for dysuria, urgency, frequency, hematuria and flank pain.  Musculoskeletal: Negative for myalgias, back pain, joint pain and falls.  Skin: Negative for itching and rash.  Neurological: Negative for dizziness, tingling, tremors, sensory change, speech change, focal weakness, seizures, loss of consciousness, weakness and headaches.  Endo/Heme/Allergies: Negative for environmental allergies and polydipsia. Does not bruise/bleed easily.  SUBJECTIVE:  Admitted to ED yesterday. On 2L [place] (home dose), no distress  VITAL SIGNS: Temp:  [98.2 F (36.8 C)-98.3 F (36.8 C)] 98.3 F (36.8 C) (12/12 1227) Pulse Rate:  [88-153] 98 (12/12 1227) Resp:  [16-50] 28 (12/12 1227) BP: (88-159)/(53-90) 136/73 (12/12 1227) SpO2:  [97 %-100 %] 100 % (12/12 1227) Weight:  [64.4 kg (142 lb)] 64.4 kg (142 lb) (12/11 1909)  PHYSICAL EXAMINATION: General:  Elderly female, no distress, lying in bed  Neuro:  Alert, oriented, follows commands  HEENT:  Normocephalic  Cardiovascular:  No MRG, tachy, s1/s2 Lungs: unlabored, diminished at bases, on [place]   Abdomen:  Non-distend, non-tender, active  bowel sounds Musculoskeletal:  No deformities  Skin:  Warm, dry, intact    Recent Labs Lab 02/05/16 1913 02/06/16 0326  NA 137 134*  K 4.2 4.4  CL 100* 101  CO2 25 25  BUN 16 15  CREATININE 0.91 0.97  GLUCOSE 94 185*    Recent Labs Lab 02/05/16 1913 02/06/16 0326  HGB 13.4 11.1*  HCT 41.0 34.9*  WBC 28.5* 22.5*  PLT 436* 388   Dg Chest Portable 1 View  Result Date: 02/05/2016 CLINICAL DATA:  Shortness of breath and right-sided back pain for 2 weeks. EXAM: PORTABLE CHEST 1 VIEW COMPARISON:  Chest x-ray a 01/25/2016 and chest CT 01/24/2014 FINDINGS: Chronic severe interstitial lung disease without definite superimposed acute pulmonary process. No pleural effusions. The heart is normal in size stable. IMPRESSION: Severe chronic lung disease without definite acute overlying pulmonary process. Electronically Signed   By: Rudie MeyerP.  Gallerani M.D.   On: 02/05/2016 20:39   Ct Renal Stone Study  Result Date: 02/05/2016 CLINICAL DATA:  Right flank pain EXAM: CT ABDOMEN AND PELVIS WITHOUT CONTRAST TECHNIQUE: Multidetector CT imaging of the abdomen and pelvis was performed following the standard protocol without IV contrast. COMPARISON:  10/03/2014 FINDINGS: Lower chest: Respiratory motion artifact limits evaluation of lung base parenchyma. Extensive fibrosis is again visualized at the bilateral lung bases. Possible ground-glass density at the right greater than left lung  base could relate to acute superimposed inflammation or infection. No large pleural effusion. Heart size nonenlarged. Coronary artery calcifications. Hepatobiliary: No biliary dilatation. No focal hepatic abnormality. Punctate calcifications within the gallbladder and possibly within the wall of the gallbladder. No wall thickening. Pancreas: Unremarkable. No pancreatic ductal dilatation or surrounding inflammatory changes. Spleen: Normal in size without focal abnormality. Adrenals/Urinary Tract: Adrenal glands are within normal  limits. Low-density foci in the right kidney compatible with cysts, unchanged. No hydronephrosis. No calcified stones along the course of the ureter. Bladder is unremarkable. Stomach/Bowel: Stomach is nonenlarged. No dilated small bowel. Large amount of stool throughout the colon. Appendix not well identified. Moderate to large retained feces in the rectum Vascular/Lymphatic: Aortic atherosclerosis. No enlarged abdominal or pelvic lymph nodes. Reproductive: Status post hysterectomy. No adnexal masses. Other: No free air or free fluid. Numerous surgical clips adjacent to the distal aorta and along the iliac vessels. Musculoskeletal: Degenerative changes. No acute osseous abnormality. Partially calcified fatty mass within the anterior abdominal wall fat again visualized and unchanged. IMPRESSION: 1. Respiratory motion artifact limits the exam. Extensive fibrosis and honeycombing within the bilateral lung bases. Suspect that there is mild superimposed ground-glass density suggesting acute inflammation or infection on chronic change. No effusion. 2. No evidence for renal stone or hydronephrosis. 3. Peripherally oriented gallbladder calcifications suggests gallbladder wall calcification as opposed to intraluminal stones. No biliary dilatation. Electronically Signed   By: Jasmine PangKim  Fujinaga M.D.   On: 02/05/2016 23:35    ASSESSMENT / PLAN:  Acute Hypoxic Respiratory Failure secondary to Atypical PNA Plan  Continue Azactam  Follow Cultures, Collect Sputum culture if able  Trend Lactic Acid and Procal Gentle Fluid as patient has G1DD Trend Fever and WBC curve   Interstitial lung disease - followed by Dr. Sherene SiresWert  Plan  Supplemental oxygen to maintain saturation > 88 Continue IV solu-medrol   Jovita KussmaulKatalina Jonatan Wilsey, AG-ACNP [facility]  Pgr: [phone]  PCCM Pgr: [phone]

## 2016-02-06 NOTE — ED Notes (Signed)
Pt ambulated to restroom, pt tolerated well.  

## 2016-02-06 NOTE — H&P (Signed)
History and Physical    Kim [name] ZOX:[id number] DOB: 09-19-1940 DOA: 02/05/2016  PCP: Geraldo Pitter, MD  Patient coming from: Home.  Chief Complaint: Shortness of breath.  HPI: Kim [name] is a 75 y.o. female with interstitial lung disease was recently admitted to the hospital for shortness of breath and discharged 10 days ago on prednisone taper and was treated also for pneumonia presents to the ER because of worsening shortness of breath. Patient states her shortness of breath started worsening last few days after going home. Has been having some dry cough denies any fever chills or chest pain. In the ER patient was found to be significantly tachycardic with elevated lactate levels. CT abdomen was done because patient was complaining of right flank pain. CT scan of the abdomen shows calcified gallbladder with worsening infiltrates in the lower part of the lung. Patient was given fluid bolus and started on empiric antibiotics for pneumonia and also placed on IV Solu-Medrol for her known case of interstitial lung disease.   ED Course: IV fluid bolus given for increased lactate and tachycardia which improved after the fluid bolus. Empiric antibiotics for pneumonia and IV steroids for interstitial lung disease.  Review of Systems: As per HPI, rest all negative.   Past Medical History:  Diagnosis Date  . Constipation due to pain medication   . Diabetes mellitus without complication (HCC)   . GERD (gastroesophageal reflux disease)   . Hyperlipemia   . Hypertension   . Postinflammatory pulmonary fibrosis (HCC)   . Seasonal allergies     Past Surgical History:  Procedure Laterality Date  . ABDOMINAL HYSTERECTOMY    . COLONOSCOPY    . HERNIA REPAIR     X 2  . SMALL INTESTINE SURGERY    . TOTAL KNEE ARTHROPLASTY Right 07/06/2014   Procedure: RIGHT TOTAL KNEE ARTHROPLASTY;  Surgeon: Mckinley Jewel, MD;  Location[facility] OR;  Service: Orthopedics;  Laterality: Right;     reports  that she quit smoking about 27 years ago. Her smoking use included Cigarettes. She has a 5.00 pack-year smoking history. She has never used smokeless tobacco. She reports that she does not drink alcohol or use drugs.  Allergies  Allergen Reactions  . Codeine Nausea And Vomiting    "Takes my appetite"  . Penicillins Rash    Has patient had a PCN reaction causing immediate rash, facial/tongue/throat swelling, SOB or lightheadedness with hypotension: Yes Has patient had a PCN reaction causing severe rash involving mucus membranes or skin necrosis: No Has patient had a PCN reaction that required hospitalization No Has patient had a PCN reaction occurring within the last 10 years: No If all of the above answers are "NO", then may proceed with Cephalosporin use.     Family History  Problem Relation Age of Onset  . Stomach cancer Sister   . Heart attack Sister     Prior to Admission medications   Medication Sig Start Date End Date Taking? Authorizing Provider  acetaminophen (TYLENOL) 500 MG tablet Take 500 mg by mouth daily.    Yes Historical Provider, MD  aspirin EC 81 MG tablet Take 81 mg by mouth daily.   Yes Historical Provider, MD  famotidine (PEPCID) 20 MG tablet Take 1 tablet (20 mg total) by mouth at bedtime. 12/20/15  Yes Nyoka Cowden, MD  guaiFENesin-codeine [facility]) 100-10 MG/5ML syrup Take 5-10 mLs by mouth every 8 (eight) hours as needed for cough.   Yes Historical Provider, MD  levothyroxine (SYNTHROID, LEVOTHROID) 50 MCG tablet Take 50 mcg by mouth daily before breakfast.   Yes Historical Provider, MD  losartan-hydrochlorothiazide (HYZAAR) 100-12.5 MG per tablet Take 1 tablet by mouth daily.   Yes Historical Provider, MD  megestrol (MEGACE) 40 MG/ML suspension Take 200 mg by mouth daily.   Yes Historical Provider, MD  metFORMIN (GLUCOPHAGE) 500 MG tablet TAKE 1 TABLET BY MOUTH TWICE A DAY 03/31/13  Yes Eulis FosterPadonda B Webb, FNP  pantoprazole (PROTONIX) 40 MG tablet Take 1  tablet (40 mg total) by mouth daily. Take 30-60 min before first meal of the day 09/21/15  Yes Nyoka CowdenMichael B Wert, MD  predniSONE (DELTASONE) 10 MG tablet Take 4 tabs for 3 days, then 3 tabs for 3 days, then 2 tabs for 3 days, then 1 tab for 3 days, then 1/2 tab for 4 days. 01/28/16  Yes Jennifer Chahn-Yang Choi, DO  levofloxacin (LEVAQUIN) 500 MG tablet Take 500 mg by mouth daily. 01/28/16   Historical Provider, MD    Physical Exam: Vitals:   02/05/16 2330 02/06/16 0130 02/06/16 0200 02/06/16 0230  BP: 118/65 137/75 136/56 120/78  Pulse: 117 (!) 121 108 108  Resp: 16  (!) 40 (!) 35  Temp:      TempSrc:      SpO2: 100% 97% 98% 98%  Weight:      Height:          Constitutional: Moderately built and nourished. Vitals:   02/05/16 2330 02/06/16 0130 02/06/16 0200 02/06/16 0230  BP: 118/65 137/75 136/56 120/78  Pulse: 117 (!) 121 108 108  Resp: 16  (!) 40 (!) 35  Temp:      TempSrc:      SpO2: 100% 97% 98% 98%  Weight:      Height:       Eyes: Anicteric no pallor. ENMT: No discharge from the ears eyes nose and mouth. Neck: No mass felt. No neck rigidity. Respiratory: No rhonchi or crepitations. Cardiovascular: S1-S2 heard tachycardic. Abdomen: Soft nontender bowel sounds present. No guarding or rigidity. Musculoskeletal: No edema. No joint effusion. Skin: No rash. Skin appears warm. Neurologic: Alert awake oriented to time place and person. Moves all extremities. Psychiatric: Appears normal. Normal affect.   Labs on Admission: I have personally reviewed following labs and imaging studies  CBC:  Recent Labs Lab 02/05/16 1913  WBC 28.5*  NEUTROABS 20.5*  HGB 13.4  HCT 41.0  MCV 82.3  PLT 436*   Basic Metabolic Panel:  Recent Labs Lab 02/05/16 1913  NA 137  K 4.2  CL 100*  CO2 25  GLUCOSE 94  BUN 16  CREATININE 0.91  CALCIUM 8.8*   GFR: Estimated Creatinine Clearance: 55.1 mL/min (by C-G formula based on SCr of 0.91 mg/dL). Liver Function Tests:  Recent  Labs Lab 02/05/16 1913  AST 21  ALT 13*  ALKPHOS 50  BILITOT 0.3  PROT 6.5  ALBUMIN 3.1*   No results for input(s): LIPASE, AMYLASE in the last 168 hours. No results for input(s): AMMONIA in the last 168 hours. Coagulation Profile: No results for input(s): INR, PROTIME in the last 168 hours. Cardiac Enzymes: No results for input(s): CKTOTAL, CKMB, CKMBINDEX, TROPONINI in the last 168 hours. BNP (last 3 results) No results for input(s): PROBNP in the last 8760 hours. HbA1C: No results for input(s): HGBA1C in the last 72 hours. CBG: No results for input(s): GLUCAP in the last 168 hours. Lipid Profile: No results for input(s): CHOL, HDL, LDLCALC, TRIG, CHOLHDL, LDLDIRECT  in the last 72 hours. Thyroid Function Tests: No results for input(s): TSH, T4TOTAL, FREET4, T3FREE, THYROIDAB in the last 72 hours. Anemia Panel: No results for input(s): VITAMINB12, FOLATE, FERRITIN, TIBC, IRON, RETICCTPCT in the last 72 hours. Urine analysis:    Component Value Date/Time   COLORURINE YELLOW 02/05/2016 2233   APPEARANCEUR HAZY (A) 02/05/2016 2233   LABSPEC 1.014 02/05/2016 2233   PHURINE 7.0 02/05/2016 2233   GLUCOSEU NEGATIVE 02/05/2016 2233   HGBUR NEGATIVE 02/05/2016 2233   BILIRUBINUR NEGATIVE 02/05/2016 2233   KETONESUR NEGATIVE 02/05/2016 2233   PROTEINUR NEGATIVE 02/05/2016 2233   UROBILINOGEN 0.2 10/04/2014 0100   NITRITE NEGATIVE 02/05/2016 2233   LEUKOCYTESUR LARGE (A) 02/05/2016 2233   Sepsis Labs: @LABRCNTIP (procalcitonin:4,lacticidven:4) )No results found for this or any previous visit (from the past 240 hour(s)).   Radiological Exams on Admission: Dg Chest Portable 1 View  Result Date: 02/05/2016 CLINICAL DATA:  Shortness of breath and right-sided back pain for 2 weeks. EXAM: PORTABLE CHEST 1 VIEW COMPARISON:  Chest x-ray a 01/25/2016 and chest CT 01/24/2014 FINDINGS: Chronic severe interstitial lung disease without definite superimposed acute pulmonary process. No  pleural effusions. The heart is normal in size stable. IMPRESSION: Severe chronic lung disease without definite acute overlying pulmonary process. Electronically Signed   By: Rudie MeyerP.  Gallerani M.D.   On: 02/05/2016 20:39   Ct Renal Stone Study  Result Date: 02/05/2016 CLINICAL DATA:  Right flank pain EXAM: CT ABDOMEN AND PELVIS WITHOUT CONTRAST TECHNIQUE: Multidetector CT imaging of the abdomen and pelvis was performed following the standard protocol without IV contrast. COMPARISON:  10/03/2014 FINDINGS: Lower chest: Respiratory motion artifact limits evaluation of lung base parenchyma. Extensive fibrosis is again visualized at the bilateral lung bases. Possible ground-glass density at the right greater than left lung base could relate to acute superimposed inflammation or infection. No large pleural effusion. Heart size nonenlarged. Coronary artery calcifications. Hepatobiliary: No biliary dilatation. No focal hepatic abnormality. Punctate calcifications within the gallbladder and possibly within the wall of the gallbladder. No wall thickening. Pancreas: Unremarkable. No pancreatic ductal dilatation or surrounding inflammatory changes. Spleen: Normal in size without focal abnormality. Adrenals/Urinary Tract: Adrenal glands are within normal limits. Low-density foci in the right kidney compatible with cysts, unchanged. No hydronephrosis. No calcified stones along the course of the ureter. Bladder is unremarkable. Stomach/Bowel: Stomach is nonenlarged. No dilated small bowel. Large amount of stool throughout the colon. Appendix not well identified. Moderate to large retained feces in the rectum Vascular/Lymphatic: Aortic atherosclerosis. No enlarged abdominal or pelvic lymph nodes. Reproductive: Status post hysterectomy. No adnexal masses. Other: No free air or free fluid. Numerous surgical clips adjacent to the distal aorta and along the iliac vessels. Musculoskeletal: Degenerative changes. No acute osseous  abnormality. Partially calcified fatty mass within the anterior abdominal wall fat again visualized and unchanged. IMPRESSION: 1. Respiratory motion artifact limits the exam. Extensive fibrosis and honeycombing within the bilateral lung bases. Suspect that there is mild superimposed ground-glass density suggesting acute inflammation or infection on chronic change. No effusion. 2. No evidence for renal stone or hydronephrosis. 3. Peripherally oriented gallbladder calcifications suggests gallbladder wall calcification as opposed to intraluminal stones. No biliary dilatation. Electronically Signed   By: Jasmine PangKim  Fujinaga M.D.   On: 02/05/2016 23:35    EKG: Independently reviewed. Sinus tachycardia.  Assessment/Plan Active Problems:   Diabetes mellitus type 2 in obese (HCC)   HTN (hypertension)   Acute respiratory failure with hypoxia (HCC)   Hypothyroidism   Respiratory failure (HCC)  1. Acute respiratory failure with hypoxia with possible developing sepsis - concerning for pneumonia probably health care associated pneumonia as a cause for patient's symptoms however patient also has interstitial lung disease which also could be contributing. Patient is placed on empiric antibiotics for healthcare associated pneumonia. Follow lactate levels continue hydration and hold hydrochlorothiazide check procalcitonin levels follow blood cultures. I have consulted pulmonary critical care for their input since this is second admission within the last 3 weeks for similar complaints. 2. Abdominal pain - CT scan shows calcified gallbladder. Will check a HIDA scan. 3. Diabetes mellitus type 2 - patient is placed on sliding scale. Hold metformin due to elevated lactate. Closely follow CBGs since patient is on IV Solu-Medrol. 4. Hypertension - continue Cozaar but hold hydrochlorothiazide since patient is receiving fluids. 5. Hypothyroidism - on Synthroid post ablation. Check TSH since patient is  tachycardic. 6. Interstitial lung disease being followed by pulmonologist - I have consulted pulmonary critical care for their input.   DVT prophylaxis: Lovenox. Code Status: full code. Family Communication: Discussed with patient.  Disposition Plan: Home.  Consults called: Pulmonary critical care.  Admission status: Inpatient.    Eduard Clos MD [facility] Pager [phone].  If 7PM-7AM, please contact night-coverage [email or web address] Password [facility]  02/06/2016, 3:28 AM

## 2016-02-06 NOTE — ED Notes (Signed)
Dr, Rama contacted with increased lactic to 3.1 and increased respiratory rate. States she will come to see pt.

## 2016-02-06 NOTE — ED Notes (Signed)
Attempted to call report

## 2016-02-06 NOTE — ED Notes (Signed)
Page placed tyo Dr. Darnelle Catalanama

## 2016-02-06 NOTE — ED Notes (Signed)
Dr Rama at bedside 

## 2016-02-07 LAB — RHEUMATOID FACTOR: Rhuematoid fact SerPl-aCnc: <10 IU/mL (ref 0.0–13.9)

## 2016-02-07 LAB — BASIC METABOLIC PANEL
Anion gap: 6 (ref 5–15)
BUN: 16 mg/dL (ref 6–20)
CO2: 25 mmol/L (ref 22–32)
CREATININE: 0.87 mg/dL (ref 0.44–1.00)
Calcium: 7.9 mg/dL — ABNORMAL LOW (ref 8.9–10.3)
Chloride: 105 mmol/L (ref 101–111)
GFR calc non Af Amer: >60 mL/min (ref >60)
Glucose, Bld: 199 mg/dL — ABNORMAL HIGH (ref 65–99)
Potassium: 4.2 mmol/L (ref 3.5–5.1)
SODIUM: 136 mmol/L (ref 135–145)

## 2016-02-07 LAB — URINE CULTURE

## 2016-02-07 LAB — CBC WITH DIFFERENTIAL/PLATELET
BASOS ABS: 0 10*3/uL (ref 0.0–0.1)
BASOS PCT: 0 %
EOS PCT: 0 %
Eosinophils Absolute: 0 10*3/uL (ref 0.0–0.7)
HCT: 28.6 % — ABNORMAL LOW (ref 36.0–46.0)
Hemoglobin: 9.4 g/dL — ABNORMAL LOW (ref 12.0–15.0)
Lymphocytes Relative: 8 %
Lymphs Abs: 1.9 10*3/uL (ref 0.7–4.0)
MCH: 26.9 pg (ref 26.0–34.0)
MCHC: 32.9 g/dL (ref 30.0–36.0)
MCV: 81.7 fL (ref 78.0–100.0)
MONO ABS: 1.1 10*3/uL — AB (ref 0.1–1.0)
Monocytes Relative: 5 %
Neutro Abs: 19.3 10*3/uL — ABNORMAL HIGH (ref 1.7–7.7)
Neutrophils Relative %: 87 %
PLATELETS: 318 10*3/uL (ref 150–400)
RBC: 3.5 MIL/uL — ABNORMAL LOW (ref 3.87–5.11)
RDW: 16.4 % — AB (ref 11.5–15.5)
WBC: 22.3 10*3/uL — ABNORMAL HIGH (ref 4.0–10.5)

## 2016-02-07 LAB — RESPIRATORY PANEL BY PCR
Adenovirus: NOT DETECTED
BORDETELLA PERTUSSIS-RVPCR: NOT DETECTED
CHLAMYDOPHILA PNEUMONIAE-RVPPCR: NOT DETECTED
CORONAVIRUS HKU1-RVPPCR: NOT DETECTED
Coronavirus 229E: NOT DETECTED
Coronavirus NL63: NOT DETECTED
Coronavirus OC43: NOT DETECTED
INFLUENZA A-RVPPCR: NOT DETECTED
Influenza B: NOT DETECTED
MYCOPLASMA PNEUMONIAE-RVPPCR: NOT DETECTED
Metapneumovirus: NOT DETECTED
PARAINFLUENZA VIRUS 3-RVPPCR: NOT DETECTED
PARAINFLUENZA VIRUS 4-RVPPCR: NOT DETECTED
Parainfluenza Virus 1: NOT DETECTED
Parainfluenza Virus 2: NOT DETECTED
RHINOVIRUS / ENTEROVIRUS - RVPPCR: NOT DETECTED
Respiratory Syncytial Virus: DETECTED — AB

## 2016-02-07 LAB — MAGNESIUM: MAGNESIUM: 1.9 mg/dL (ref 1.7–2.4)

## 2016-02-07 LAB — GLUCOSE, CAPILLARY
GLUCOSE-CAPILLARY: 108 mg/dL — AB (ref 65–99)
GLUCOSE-CAPILLARY: 195 mg/dL — AB (ref 65–99)
GLUCOSE-CAPILLARY: 182 mg/dL — AB (ref 65–99)
Glucose-Capillary: 339 mg/dL — ABNORMAL HIGH (ref 65–99)

## 2016-02-07 LAB — FANA STAINING PATTERNS: Homogeneous Pattern: 1:80 {titer}

## 2016-02-07 LAB — ANTI-SCLERODERMA ANTIBODY: Scleroderma (Scl-70) (ENA) Antibody, IgG: <0.2 AI (ref 0.0–0.9)

## 2016-02-07 LAB — CYCLIC CITRUL PEPTIDE ANTIBODY, IGG/IGA: CCP Antibodies IgG/IgA: 6 units (ref 0–19)

## 2016-02-07 LAB — ANTINUCLEAR ANTIBODIES, IFA: ANTINUCLEAR ANTIBODIES, IFA: POSITIVE — AB

## 2016-02-07 LAB — PHOSPHORUS: PHOSPHORUS: 2.8 mg/dL (ref 2.5–4.6)

## 2016-02-07 LAB — GLOMERULAR BASEMENT MEMBRANE ANTIBODIES: GBM AB: 4 U (ref 0–20)

## 2016-02-07 LAB — MPO/PR-3 (ANCA) ANTIBODIES
ANCA Proteinase 3: <3.5 U/mL (ref 0.0–3.5)
Myeloperoxidase Abs: <9 U/mL (ref 0.0–9.0)

## 2016-02-07 LAB — TROPONIN I: Troponin I: 0.03 ng/mL (ref <0.03)

## 2016-02-07 LAB — LACTIC ACID, PLASMA: LACTIC ACID, VENOUS: 1.3 mmol/L (ref 0.5–1.9)

## 2016-02-07 MED ORDER — SODIUM CHLORIDE 0.9 % IV SOLN: INTRAVENOUS | Status: DC

## 2016-02-07 MED ORDER — DEXTROSE 5 % IV SOLN
1.0000 g | Freq: Three times a day (TID) | INTRAVENOUS | Status: DC
  Administered 2016-02-07 – 2016-02-08 (×3): 1 g via INTRAVENOUS
  Filled 2016-02-07 (×5): qty 1

## 2016-02-07 NOTE — Progress Notes (Signed)
Name: Kim [name] MRN: [id number] DOB: 1941-02-03    ADMISSION DATE:  02/05/2016 CONSULTATION DATE:  12/12   REFERRING MD :  Dr. Darnelle Catalanama  CHIEF COMPLAINT:  Dyspnea   BRIEF PATIENT DESCRIPTION:  75 year old with PMH of interstitial lung disease (Dr. Sherene SiresWert), DM, GERD, hyperlipemia, hypothymism, and HTN presents to ED on 12/11 with worsening SOB, right flank pain, and non-productive cough. In ED patient tachycardic with elevated lactate levels, CT abdomen shows calcified gallbladder with worsening infiltrates in the lower part of the lung. Worked up for PNA, given empiric antibiotics and IV solu-medrol.    SIGNIFICANT EVENTS  12/11 > Presents to ED with Dyspnea   STUDIES:  Echo 12/1 > G1DD EF 65-70% CXR 12/11 > Chronic lung disease, no acute findings.  CT Renal Stone 12/11 > Negative Stone, gallbladder wall calcification   HISTORY OF PRESENT ILLNESS:   Admitted from 11/30-12/3 with reported 2 weeks of dyspnea, worked up for atypical presentation of CAP with azithromax/rocephin then changed to Levaquin, also treated with IV solumedrol and then tapered to po prednisone. Patient was discharged with 2L [place]. Was seen in the outpatient pulmonary office on 12/5    SUBJECTIVE:  NAD feels better  VITAL SIGNS: Temp:  [97.6 F (36.4 C)-98.4 F (36.9 C)] 98.4 F (36.9 C) (12/13 0732) Pulse Rate:  [70-105] 70 (12/13 0732) Resp:  [21-34] 28 (12/13 0732) BP: (120-143)/(49-73) 137/60 (12/13 0732) SpO2:  [98 %-100 %] 99 % (12/13 0732) Weight:  [151 lb 3.8 oz (68.6 kg)] 151 lb 3.8 oz (68.6 kg) (12/13 0400)  PHYSICAL EXAMINATION: General:  Elderly female, no distress, sitting in chair Neuro:  Alert, oriented, follows commands  HEENT:  Normocephalic  Cardiovascular:  No MRG, tachy, s1/s2 Lungs: unlabored, diminished at bases, on [place]   Abdomen:  Non-distend, non-tender, active bowel sounds Musculoskeletal:  No deformities  Skin:  Warm, dry, intact    Recent Labs Lab 02/05/16 1913  02/06/16 0326 02/07/16 0010  NA 137 134* 136  K 4.2 4.4 4.2  CL 100* 101 105  CO2 25 25 25   BUN 16 15 16   CREATININE 0.91 0.97 0.87  GLUCOSE 94 185* 199*    Recent Labs Lab 02/05/16 1913 02/06/16 0326 02/07/16 0010  HGB 13.4 11.1* 9.4*  HCT 41.0 34.9* 28.6*  WBC 28.5* 22.5* 22.3*  PLT 436* 388 318   Ct Chest High Resolution  Result Date: 02/06/2016 CLINICAL DATA:  75 year old female with history of interstitial lung disease. Followup study. EXAM: CT CHEST WITHOUT CONTRAST TECHNIQUE: Multidetector CT imaging of the chest was performed following the standard protocol without intravenous contrast. High resolution imaging of the lungs, as well as inspiratory and expiratory imaging, was performed. COMPARISON:  Chest CT 01/25/2016. FINDINGS: Cardiovascular: Heart size is normal. There is no significant pericardial fluid, thickening or pericardial calcification. There is aortic atherosclerosis, as well as atherosclerosis of the great vessels of the mediastinum and the coronary arteries, including calcified atherosclerotic plaque in the left main, left anterior descending, left circumflex and right coronary arteries. Mediastinum/Nodes: There are multiple borderline enlarged and enlarged mediastinal and bilateral hilar lymph nodes, similar to prior examinations, measuring up to 17 mm in short axis in the right paratracheal nodal station. These are presumably chronic and reactive in the setting of interstitial lung disease. Esophagus is unremarkable in appearance. No axillary lymphadenopathy. Lungs/Pleura: High-resolution images again demonstrate widespread areas of thickening of the peribronchovascular interstitium, subpleural reticulation, parenchymal banding, traction bronchiectasis and extensive honeycombing. These findings have  a definitive craniocaudal gradient. These findings appear relatively stable compared to the most recent prior study, but appear progressed when compared to more remote  prior examinations, and are considered diagnostic from an imaging standpoint of usual interstitial pneumonia (UIP). Inspiratory and expiratory imaging is unremarkable. There is no acute consolidative airspace disease and no pleural effusions. No definite suspicious appearing pulmonary nodules or masses are noted. Upper Abdomen: Aortic atherosclerosis. Musculoskeletal: There are no aggressive appearing lytic or blastic lesions noted in the visualized portions of the skeleton. IMPRESSION: 1. 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 2. Aortic atherosclerosis, in addition to left main and 3 vessel coronary artery disease. Please note that although the presence of coronary artery calcium documents the presence of coronary artery disease, the severity of this disease and any potential stenosis cannot be assessed on this non-gated CT examination. Assessment for potential risk factor modification, dietary therapy or pharmacologic therapy may be warranted, if clinically indicated. Electronically Signed   By: Trudie Reedaniel  Entrikin M.D.   On: 02/06/2016 19:06   Dg Chest Portable 1 View  Result Date: 02/05/2016 CLINICAL DATA:  Shortness of breath and right-sided back pain for 2 weeks. EXAM: PORTABLE CHEST 1 VIEW COMPARISON:  Chest x-ray a 01/25/2016 and chest CT 01/24/2014 FINDINGS: Chronic severe interstitial lung disease without definite superimposed acute pulmonary process. No pleural effusions. The heart is normal in size stable. IMPRESSION: Severe chronic lung disease without definite acute overlying pulmonary process. Electronically Signed   By: Rudie MeyerP.  Gallerani M.D.   On: 02/05/2016 20:39   Ct Renal Stone Study  Result Date: 02/05/2016 CLINICAL DATA:  Right flank pain EXAM: CT ABDOMEN AND PELVIS WITHOUT CONTRAST TECHNIQUE: Multidetector CT imaging of the abdomen and pelvis was  performed following the standard protocol without IV contrast. COMPARISON:  10/03/2014 FINDINGS: Lower chest: Respiratory motion artifact limits evaluation of lung base parenchyma. Extensive fibrosis is again visualized at the bilateral lung bases. Possible ground-glass density at the right greater than left lung base could relate to acute superimposed inflammation or infection. No large pleural effusion. Heart size nonenlarged. Coronary artery calcifications. Hepatobiliary: No biliary dilatation. No focal hepatic abnormality. Punctate calcifications within the gallbladder and possibly within the wall of the gallbladder. No wall thickening. Pancreas: Unremarkable. No pancreatic ductal dilatation or surrounding inflammatory changes. Spleen: Normal in size without focal abnormality. Adrenals/Urinary Tract: Adrenal glands are within normal limits. Low-density foci in the right kidney compatible with cysts, unchanged. No hydronephrosis. No calcified stones along the course of the ureter. Bladder is unremarkable. Stomach/Bowel: Stomach is nonenlarged. No dilated small bowel. Large amount of stool throughout the colon. Appendix not well identified. Moderate to large retained feces in the rectum Vascular/Lymphatic: Aortic atherosclerosis. No enlarged abdominal or pelvic lymph nodes. Reproductive: Status post hysterectomy. No adnexal masses. Other: No free air or free fluid. Numerous surgical clips adjacent to the distal aorta and along the iliac vessels. Musculoskeletal: Degenerative changes. No acute osseous abnormality. Partially calcified fatty mass within the anterior abdominal wall fat again visualized and unchanged. IMPRESSION: 1. Respiratory motion artifact limits the exam. Extensive fibrosis and honeycombing within the bilateral lung bases. Suspect that there is mild superimposed ground-glass density suggesting acute inflammation or infection on chronic change. No effusion. 2. No evidence for renal stone or  hydronephrosis. 3. Peripherally oriented gallbladder calcifications suggests gallbladder wall calcification as opposed to intraluminal stones. No biliary dilatation. Electronically Signed  By: Jasmine Pang M.D.   On: 02/05/2016 23:35    ASSESSMENT / PLAN:  Acute Hypoxic Respiratory Failure secondary to Atypical PNA Improving 12/13 Plan  Continue cefepime and vanc. Consider dc vanc 12/13 Follow Cultures, Collect Sputum culture if able  Trend Lactic Acid and Procal Gentle Fluid as patient has G1DD Trend Fever and WBC curve   Interstitial lung disease - followed by Dr. Sherene Sires  Plan  Supplemental oxygen to maintain saturation > 88 Continue IV solu-medrol . Consider transition to prednisone 12/13  [facility] Firman Petrow ACNP [facility] PCCM Pager [phone] till 3 pm  If no answer page [phone] 02/07/2016, 10:45 AM

## 2016-02-07 NOTE — Progress Notes (Signed)
Progress Note    Kim [name]  ZOX:[id number] DOB: [birth year]  DOA: 02/05/2016 PCP: Geraldo Pitter, MD    Brief Narrative:   Chief complaint: Follow-up sepsis  Kim [name] is an 75 y.o. female with a PMH of interstitial lung disease, recent hospitalization for treatment of dyspnea secondary to pneumonia, discharged on a prednisone taper, who was readmitted 02/05/16 with worsening shortness of breath. In the ED, the patient was found to be tachycardic with elevated serum lactate. CT of the abdomen showed an incidentally discovered calcified call bladder with worsening infiltrates in the lower parts of the lungs.  Assessment/Plan:   Principal Problem:   Sepsis (HCC) secondary to HCAP/Acute on chronic respiratory failure Blood pressure stable after requiring fluid volume resuscitation on admission. Evaluated by pulmonologist 02/06/16. Lactic acid cleared. Follow-up blood cultures/sputum culture. High resolution CT done 02/06/16, personally reviewed, which showed findings consistent with UIP. MRSA PCR negative, so will discontinue vancomycin. Continue cefepime.  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 LFTs okay. We'll hold off on HIDA scan for now given patient's tachypnea and hypotension. Spoke with Lianne Bushy about whether or not this needs to be evaluated emergently given possible recurrent sepsis as the potential etiology.  Abdominal exam unimpressive. Consider surgery evaluation, which can likely be done as an outpatient once the patient stabilizes.    Diabetes mellitus type 2 in obese [facility]) Currently being managed with insulin sensitive SSI 3 times a day. CBGs 127-297 (297 outlier).    HTN (hypertension) Blood pressure currently stable, off antihypertensives.    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 Autoimmune workup in progress.    Hypothyroidism Continue Synthroid.    Aortic atherosclerosis/CAD Noted on high resolution CT. Troponin 0.03. Continue aspirin.   Family Communication/Anticipated D/C date and plan/Code Status   DVT prophylaxis: Lovenox ordered. Code Status: Full Code.  Family Communication: No family currently at bedside. Left message for daughter. Disposition Plan: Home when respiratory status stable, likely several more days.   Medical Consultants:    Pulmonology   Procedures:    None  Anti-Infectives:    Aztreonam 02/06/16--->02/07/16  Cefepime 02/07/16--->  Vancomycin 02/06/16--->02/07/16  Subjective:   The patient reports that her shortness of breath is better. Review of symptoms is positive for cough with yellow-clear sputum and negative for abdominal pain, nausea or vomiting.  Objective:    Vitals:   02/07/16 0000 02/07/16 0300 02/07/16 0400 02/07/16 0732  BP: 132/63 (!) 131/59  137/60  Pulse:  73  70  Resp:  (!) 21  (!) 28  Temp: 98.3 F (36.8 C) 98.3 F (36.8 C)  98.4 F (36.9 C)  TempSrc: Oral Oral  Oral  SpO2: 100% 100%  99%  Weight:   68.6 kg (151 lb 3.8 oz)   Height:        Intake/Output Summary (Last 24 hours) at 02/07/16 0739 Last data filed at 02/07/16 0630  Gross per 24 hour  Intake             1725 ml  Output             1100 ml  Net              625 ml   Filed Weights   02/05/16 1909 02/07/16 0400  Weight: 64.4 kg (142 lb) 68.6 kg (151 lb 3.8 oz)    Exam: General exam:  Appears calm and comfortable. Sitting up in chair. Respiratory system: Bilateral crackles and minimally increased respiratory effort. Cardiovascular system: S1 & S2 heard, tachycardic rate, regular rhythm. No JVD,  rubs, gallops or clicks. No murmurs. Gastrointestinal system: Abdomen is nondistended, soft and nontender. No organomegaly or masses felt. Normal bowel sounds heard. Central nervous system: Sleepy, oriented 2. No  focal neurological deficits. Extremities: No clubbing,  or cyanosis. No edema. Skin: No rashes, lesions or ulcers. Psychiatry: Judgement and insight appear normal. Mood & affect appropriate.   Data Reviewed:   I have personally reviewed following labs and imaging studies:  Labs: Basic Metabolic Panel:  Recent Labs Lab 02/05/16 1913 02/06/16 0326 02/07/16 0010  NA 137 134* 136  K 4.2 4.4 4.2  CL 100* 101 105  CO2 25 25 25   GLUCOSE 94 185* 199*  BUN 16 15 16   CREATININE 0.91 0.97 0.87  CALCIUM 8.8* 7.9* 7.9*  MG  --   --  1.9  PHOS  --   --  2.8   GFR Estimated Creatinine Clearance: 59.3 mL/min (by C-G formula based on SCr of 0.87 mg/dL). Liver Function Tests:  Recent Labs Lab 02/05/16 1913 02/06/16 0326  AST 21 21  ALT 13* 10*  ALKPHOS 50 42  BILITOT 0.3 0.3  PROT 6.5 5.4*  ALBUMIN 3.1* 2.5*   CBC:  Recent Labs Lab 02/05/16 1913 02/06/16 0326 02/07/16 0010  WBC 28.5* 22.5* 22.3*  NEUTROABS 20.5* 19.9* 19.3*  HGB 13.4 11.1* 9.4*  HCT 41.0 34.9* 28.6*  MCV 82.3 82.9 81.7  PLT 436* 388 318   CBG:  Recent Labs Lab 02/06/16 1232 02/06/16 1624 02/06/16 2042 02/06/16 2306 02/07/16 0731  GLUCAP 158* 297* 181* 127* 108*   Thyroid function studies:  Recent Labs  02/06/16 0716  TSH 0.779   Sepsis Labs:  Recent Labs Lab 02/05/16 1913  02/05/16 2206 02/06/16 0326 02/06/16 0703 02/06/16 0931 02/06/16 1654 02/07/16 0010  PROCALCITON  --   --   --   --  0.12  --  0.17  --   WBC 28.5*  --   --  22.5*  --   --   --  22.3*  LATICACIDVEN  --   < > 2.44*  --  2.7* 3.1*  --  1.3  < > = values in this interval not displayed.  Microbiology Recent Results (from the past 240 hour(s))  Blood Culture (routine x 2)     Status: None (Preliminary result)   Collection Time: 02/05/16  7:43 PM  Result Value Ref Range Status   Specimen Description BLOOD RIGHT HAND  Final   Special Requests IN PEDIATRIC BOTTLE 1CC  Final   Culture NO GROWTH < 24 HOURS   Final   Report Status PENDING  Incomplete  Blood Culture (routine x 2)     Status: None (Preliminary result)   Collection Time: 02/05/16  8:43 PM  Result Value Ref Range Status   Specimen Description BLOOD LEFT HAND  Final   Special Requests BOTTLES DRAWN AEROBIC ONLY 6CC  Final   Culture NO GROWTH < 24 HOURS  Final   Report Status PENDING  Incomplete  MRSA PCR Screening     Status: None   Collection Time: 02/06/16 12:30 PM  Result Value Ref Range Status   MRSA by PCR NEGATIVE NEGATIVE Final    Comment:        The GeneXpert MRSA Assay (FDA approved for NASAL specimens only), is one component of a comprehensive MRSA  colonization surveillance program. It is not intended to diagnose MRSA infection nor to guide or monitor treatment for MRSA infections.     Radiology: Ct Chest High Resolution  Result Date: 02/06/2016 CLINICAL DATA:  75 year old female with history of interstitial lung disease. Followup study. EXAM: CT CHEST WITHOUT CONTRAST TECHNIQUE: Multidetector CT imaging of the chest was performed following the standard protocol without intravenous contrast. High resolution imaging of the lungs, as well as inspiratory and expiratory imaging, was performed. COMPARISON:  Chest CT 01/25/2016. FINDINGS: Cardiovascular: Heart size is normal. There is no significant pericardial fluid, thickening or pericardial calcification. There is aortic atherosclerosis, as well as atherosclerosis of the great vessels of the mediastinum and the coronary arteries, including calcified atherosclerotic plaque in the left main, left anterior descending, left circumflex and right coronary arteries. Mediastinum/Nodes: There are multiple borderline enlarged and enlarged mediastinal and bilateral hilar lymph nodes, similar to prior examinations, measuring up to 17 mm in short axis in the right paratracheal nodal station. These are presumably chronic and reactive in the setting of interstitial lung disease.  Esophagus is unremarkable in appearance. No axillary lymphadenopathy. Lungs/Pleura: High-resolution images again demonstrate widespread areas of thickening of the peribronchovascular interstitium, subpleural reticulation, parenchymal banding, traction bronchiectasis and extensive honeycombing. These findings have a definitive craniocaudal gradient. 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 Inspiratory and expiratory imaging is unremarkable. There is no acute consolidative airspace disease and no pleural effusions. No definite suspicious appearing pulmonary nodules or masses are noted. Upper Abdomen: Aortic atherosclerosis. Musculoskeletal: There are no aggressive appearing lytic or blastic lesions noted in the visualized portions of the skeleton. IMPRESSION: 1. 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 2. Aortic atherosclerosis, in addition to left main and 3 vessel coronary artery disease. Please note that although the presence of coronary artery calcium documents the presence of coronary artery disease, the severity of this disease and any potential stenosis cannot be assessed on this non-gated CT examination. Assessment for potential risk factor modification, dietary therapy or pharmacologic therapy may be warranted, if clinically indicated. Electronically Signed   By: Trudie Reedaniel  Entrikin M.D.   On: 02/06/2016 19:06   Dg Chest Portable 1 View  Result Date: 02/05/2016 CLINICAL DATA:  Shortness of breath and right-sided back pain for 2 weeks. EXAM: PORTABLE CHEST 1 VIEW COMPARISON:  Chest x-ray a 01/25/2016 and chest CT 01/24/2014 FINDINGS: Chronic severe interstitial  lung disease without definite superimposed acute pulmonary process. No pleural effusions. The heart is normal in size stable. IMPRESSION: Severe chronic lung disease without definite acute overlying pulmonary process. Electronically Signed   By: Rudie MeyerP.  Gallerani M.D.   On: 02/05/2016 20:39   Ct Renal Stone Study  Result Date: 02/05/2016 CLINICAL DATA:  Right flank pain EXAM: CT ABDOMEN AND PELVIS WITHOUT CONTRAST TECHNIQUE: Multidetector CT imaging of the abdomen and pelvis was performed following the standard protocol without IV contrast. COMPARISON:  10/03/2014 FINDINGS: Lower chest: Respiratory motion artifact limits evaluation of lung base parenchyma. Extensive fibrosis is again visualized at the bilateral lung bases. Possible ground-glass density at the right greater than left lung base could relate to acute superimposed inflammation or infection. No large pleural effusion. Heart size nonenlarged. Coronary artery calcifications. Hepatobiliary: No biliary dilatation. No focal  hepatic abnormality. Punctate calcifications within the gallbladder and possibly within the wall of the gallbladder. No wall thickening. Pancreas: Unremarkable. No pancreatic ductal dilatation or surrounding inflammatory changes. Spleen: Normal in size without focal abnormality. Adrenals/Urinary Tract: Adrenal glands are within normal limits. Low-density foci in the right kidney compatible with cysts, unchanged. No hydronephrosis. No calcified stones along the course of the ureter. Bladder is unremarkable. Stomach/Bowel: Stomach is nonenlarged. No dilated small bowel. Large amount of stool throughout the colon. Appendix not well identified. Moderate to large retained feces in the rectum Vascular/Lymphatic: Aortic atherosclerosis. No enlarged abdominal or pelvic lymph nodes. Reproductive: Status post hysterectomy. No adnexal masses. Other: No free air or free fluid. Numerous surgical clips adjacent to the distal aorta and along the iliac  vessels. Musculoskeletal: Degenerative changes. No acute osseous abnormality. Partially calcified fatty mass within the anterior abdominal wall fat again visualized and unchanged. IMPRESSION: 1. Respiratory motion artifact limits the exam. Extensive fibrosis and honeycombing within the bilateral lung bases. Suspect that there is mild superimposed ground-glass density suggesting acute inflammation or infection on chronic change. No effusion. 2. No evidence for renal stone or hydronephrosis. 3. Peripherally oriented gallbladder calcifications suggests gallbladder wall calcification as opposed to intraluminal stones. No biliary dilatation. Electronically Signed   By: Jasmine Pang M.D.   On: 02/05/2016 23:35    Medications:   . aspirin EC  81 mg Oral Daily  . aztreonam  1 g Intravenous Q8H  . enoxaparin (LOVENOX) injection  40 mg Subcutaneous Daily  . famotidine  20 mg Oral QHS  . insulin aspart  0-9 Units Subcutaneous TID WC  . levothyroxine  50 mcg Oral QAC breakfast  . megestrol  200 mg Oral Daily  . methylPREDNISolone (SOLU-MEDROL) injection  40 mg Intravenous Daily  . pantoprazole  40 mg Oral Daily  . vancomycin  500 mg Intravenous Q12H   Continuous Infusions:  This is a level III, high complexity visit.   LOS: 1 day   RAMA,CHRISTINA  [facility] Pager [phone]. If unable to reach me by pager, please call my cell phone at [phone].  *Please refer to [email or web address], password [email or web address] to get updated schedule on who will round on this patient, as hospitalists switch teams weekly. If 7PM-7AM, please contact night-coverage at [email or web address], password [email or web address] for any overnight needs.  02/07/2016, 7:39 AM

## 2016-02-07 NOTE — Progress Notes (Signed)
CRITICAL VALUE ALERT  Critical value received: Troponin 0.03    Date of notification:  02/07/2016  Time of notification:  0125  Critical value read back: YES  Nurse who received alert:  Hulen LusterS. Alizea Pell  MD notified (1st page):  NP Donnamarie PoagK Kirby  Time of first page: 0127  MD notified (2nd page):  Time of second page:  Responding MD:   Time MD responded:

## 2016-02-07 NOTE — Progress Notes (Signed)
Pharmacy Antibiotic Note  Kim [name] Kim [name] is a 75 y.o. female admitted on 02/05/2016 with SOB and probably HCAP.  Pharmacy has been consulted for vancomycin and cefepime dosing.  Day #3 of abx for Sepsis, recent hospitalization for PNA. Afebrile, WBC 22.3. LA 3.1 on admission (PTA levaquin)  Plan: Stop aztreonam Start cefepime 1g IV Q8 Continue vancomycin 500mg  IV Q12 Monitor clinical picture, renal function, VT prn F/U C&S, abx deescalation / LOT  Consider stopping vancomycin as MRSA PCR is negative  Height: 5\' 9"  (175.3 cm) Weight: 151 lb 3.8 oz (68.6 kg) IBW/kg (Calculated) : 66.2  Temp (24hrs), Avg:98.1 F (36.7 C), Min:97.6 F (36.4 C), Max:98.4 F (36.9 C)   Recent Labs Lab 02/05/16 1913 02/05/16 1954 02/05/16 2206 02/06/16 0326 02/06/16 0703 02/06/16 0931 02/07/16 0010  WBC 28.5*  --   --  22.5*  --   --  22.3*  CREATININE 0.91  --   --  0.97  --   --  0.87  LATICACIDVEN  --  2.60* 2.44*  --  2.7* 3.1* 1.3    Estimated Creatinine Clearance: 59.3 mL/min (by C-G formula based on SCr of 0.87 mg/dL).    Allergies  Allergen Reactions  . Codeine Nausea And Vomiting    "Takes my appetite"  . Penicillins Rash    Has patient had a PCN reaction causing immediate rash, facial/tongue/throat swelling, SOB or lightheadedness with hypotension: Yes Has patient had a PCN reaction causing severe rash involving mucus membranes or skin necrosis: No Has patient had a PCN reaction that required hospitalization No Has patient had a PCN reaction occurring within the last 10 years: No If all of the above answers are "NO", then may proceed with Cephalosporin use.     Antimicrobials this admission: Aztreonam 12/11 >>12/13 Vanco 12/11 >> Cefepime 12/13 >>  Dose adjustments this admission:  n/a  Microbiology results:  12/11 BCx: ngtd 12/11 UCx: sent  12/12 Resp Panel  12/12 MRSA PCR: negative  Thank you for allowing pharmacy to be a part of this patient's care.  Enzo BiNathan  Elton Heid, PharmD, BCPS Clinical Pharmacist Pager [phone] 02/07/2016 8:07 AM

## 2016-02-07 NOTE — Progress Notes (Signed)
[facility]  Patient Status: Active (receiving services up to time of hospitalization)  [facility] is providing the following services: PT  If patient discharges after hours, please call [phone].   Kizzie FurnishDonna Fellmy 02/07/2016, 3:09 PM

## 2016-02-08 DIAGNOSIS — J210 Acute bronchiolitis due to respiratory syncytial virus: Secondary | ICD-10-CM | POA: Diagnosis present

## 2016-02-08 HISTORY — DX: Acute bronchiolitis due to respiratory syncytial virus: J21.0

## 2016-02-08 LAB — CBC WITH DIFFERENTIAL/PLATELET
BASOS PCT: 0 %
Basophils Absolute: 0 10*3/uL (ref 0.0–0.1)
EOS PCT: 0 %
Eosinophils Absolute: 0 10*3/uL (ref 0.0–0.7)
HCT: 30.5 % — ABNORMAL LOW (ref 36.0–46.0)
Hemoglobin: 9.8 g/dL — ABNORMAL LOW (ref 12.0–15.0)
LYMPHS ABS: 2.7 10*3/uL (ref 0.7–4.0)
Lymphocytes Relative: 16 %
MCH: 26.3 pg (ref 26.0–34.0)
MCHC: 32.1 g/dL (ref 30.0–36.0)
MCV: 82 fL (ref 78.0–100.0)
MONO ABS: 1.4 10*3/uL — AB (ref 0.1–1.0)
MONOS PCT: 8 %
NEUTROS PCT: 76 %
Neutro Abs: 13 10*3/uL — ABNORMAL HIGH (ref 1.7–7.7)
Platelets: 310 10*3/uL (ref 150–400)
RBC: 3.72 MIL/uL — AB (ref 3.87–5.11)
RDW: 16.5 % — ABNORMAL HIGH (ref 11.5–15.5)
WBC: 17.1 10*3/uL — AB (ref 4.0–10.5)

## 2016-02-08 LAB — PROCALCITONIN

## 2016-02-08 LAB — GLUCOSE, CAPILLARY
Glucose-Capillary: 111 mg/dL — ABNORMAL HIGH (ref 65–99)
Glucose-Capillary: 164 mg/dL — ABNORMAL HIGH (ref 65–99)
Glucose-Capillary: 265 mg/dL — ABNORMAL HIGH (ref 65–99)
Glucose-Capillary: 190 mg/dL — ABNORMAL HIGH (ref 65–99)

## 2016-02-08 LAB — MAGNESIUM: Magnesium: 2 mg/dL (ref 1.7–2.4)

## 2016-02-08 LAB — PHOSPHORUS: Phosphorus: 3.3 mg/dL (ref 2.5–4.6)

## 2016-02-08 MED ORDER — PREDNISONE 20 MG PO TABS
40.0000 mg | ORAL_TABLET | Freq: Every day | ORAL | Status: DC
  Administered 2016-02-08 – 2016-02-09 (×2): 40 mg via ORAL
  Filled 2016-02-08 (×2): qty 2

## 2016-02-08 MED ORDER — LEVOFLOXACIN 750 MG PO TABS
750.0000 mg | ORAL_TABLET | Freq: Every day | ORAL | Status: DC
  Administered 2016-02-08 – 2016-02-09 (×2): 750 mg via ORAL
  Filled 2016-02-08 (×2): qty 1

## 2016-02-08 MED ORDER — LOSARTAN POTASSIUM 50 MG PO TABS
100.0000 mg | ORAL_TABLET | Freq: Every day | ORAL | Status: DC
  Administered 2016-02-08 – 2016-02-09 (×2): 100 mg via ORAL
  Filled 2016-02-08 (×2): qty 2

## 2016-02-08 MED ORDER — HYDROCHLOROTHIAZIDE 12.5 MG PO CAPS
12.5000 mg | ORAL_CAPSULE | Freq: Every day | ORAL | Status: DC
  Administered 2016-02-08 – 2016-02-09 (×2): 12.5 mg via ORAL
  Filled 2016-02-08 (×2): qty 1

## 2016-02-08 MED ORDER — LOSARTAN POTASSIUM-HCTZ 100-12.5 MG PO TABS: 1.0000 | ORAL_TABLET | Freq: Every day | ORAL | Status: DC

## 2016-02-08 MED ORDER — ENOXAPARIN SODIUM 40 MG/0.4ML ~~LOC~~ SOLN
40.0000 mg | Freq: Every day | SUBCUTANEOUS | Status: DC
  Administered 2016-02-08: 40 mg via SUBCUTANEOUS
  Filled 2016-02-08: qty 0.4

## 2016-02-08 NOTE — Progress Notes (Signed)
Progress Note    Kim [name]  WGN:[id number] DOB: September 29, 1940  DOA: 02/05/2016 PCP: Geraldo PitterBLAND,VEITA J, MD    Brief Narrative:   Chief complaint: Follow-up sepsis  Kim [name] is an 75 y.o. female with a PMH of interstitial lung disease, recent hospitalization for treatment of dyspnea secondary to pneumonia, discharged on a prednisone taper, who was readmitted 02/05/16 with worsening shortness of breath. In the ED, the patient was found to be tachycardic with elevated serum lactate. CT of the abdomen showed an incidentally discovered calcified call bladder with worsening infiltrates in the lower parts of the lungs.  Assessment/Plan:   Principal Problem:   Sepsis (HCC) secondary to HCAP from RSV/Acute on chronic respiratory failure Blood pressure stable after requiring fluid volume resuscitation on admission. Evaluated by pulmonologist 02/06/16. Lactic acid cleared. Follow-up blood cultures/sputum culture. High resolution CT done 02/06/16, personally reviewed, which showed findings consistent with UIP. MRSA PCR negative,Vancomycin discontinued 02/07/16. Respiratory virus panel positive for RSV. Treatment is primarily supportive. Secondary bacterial infection is possible, so we'll continue antibiotics but switch to oral Levaquin. Mobilize.  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 LFTs okay.  Abdominal exam unimpressive. Consider surgery evaluation, which can likely be done as an outpatient once the patient stabilizes.    Diabetes mellitus type 2 in obese [facility](HCC) Currently being managed with insulin sensitive SSI 3 times a day. CBGs 108-339 (339 outlier).    HTN (hypertension) Blood pressure elevated, resume losartan/HCTZ.    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 Will need 3 weeks of steroids. Switch Solu-Medrol to  prednisone 40 mg daily.    Hypothyroidism Continue Synthroid.    Aortic atherosclerosis/CAD Noted on high resolution CT. Troponin 0.03. Continue aspirin.   Family Communication/Anticipated D/C date and plan/Code Status   DVT prophylaxis: Lovenox ordered. Code Status: Full Code.  Family Communication: No family currently at bedside.  Disposition Plan: Home when respiratory status stable, likely in the next 24 hours.   Medical Consultants:    Pulmonology   Procedures:    None  Anti-Infectives:    Aztreonam 02/06/16--->02/07/16  Cefepime 02/07/16--->02/08/16  Vancomycin 02/06/16--->02/07/16  Levaquin 02/08/16--->  Subjective:   The patient reports that her shortness of breath is better, But that she does not yet feel back to her usual baseline. Review of symptoms is positive for occasional cough and negative for abdominal pain, nausea or vomiting.  Objective:    Vitals:   02/07/16 2000 02/07/16 2300 02/08/16 0355 02/08/16 0400  BP: (!) 130/57 (!) 133/53 (!) 130/53 (!) 148/104  Pulse: 75 65 82 99  Resp: (!) 33 (!) 26 16 (!) 29  Temp:  98.7 F (37.1 C) 98.4 F (36.9 C)   TempSrc:  Oral Oral   SpO2: 100% 100% 100% 93%  Weight:    66.8 kg (147 lb 4.3 oz)  Height:        Intake/Output Summary (Last 24 hours) at 02/08/16 0729 Last data filed at 02/08/16 0601  Gross per 24 hour  Intake              100 ml  Output             1300 ml  Net            -1200 ml   Filed Weights   02/05/16 1909 02/07/16 0400 02/08/16 0400  Weight: 64.4 kg (142 lb) 68.6 kg (151  lb 3.8 oz) 66.8 kg (147 lb 4.3 oz)    Exam: General exam: Appears calm and comfortable.  Respiratory system: Bilateral crackles (Unchanged) and minimally increased respiratory effort. Cardiovascular system: S1 & S2 heard, tachycardic rate, regular rhythm. No JVD,  rubs, gallops or clicks. No murmurs. Gastrointestinal system: Abdomen is nondistended, soft and nontender. No organomegaly or masses felt.  Normal bowel sounds heard. Central nervous system: Sleepy, oriented 2. No focal neurological deficits. Extremities: No clubbing,  or cyanosis. No edema. Skin: No rashes, lesions or ulcers. Psychiatry: Judgement and insight appear normal. Mood & affect appropriate.   Data Reviewed:   I have personally reviewed following labs and imaging studies:  Labs: Basic Metabolic Panel:  Recent Labs Lab 02/05/16 1913 02/06/16 0326 02/07/16 0010 02/08/16 0450  NA 137 134* 136  --   K 4.2 4.4 4.2  --   CL 100* 101 105  --   CO2 25 25 25   --   GLUCOSE 94 185* 199*  --   BUN 16 15 16   --   CREATININE 0.91 0.97 0.87  --   CALCIUM 8.8* 7.9* 7.9*  --   MG  --   --  1.9 2.0  PHOS  --   --  2.8 3.3   GFR Estimated Creatinine Clearance: 59.3 mL/min (by C-G formula based on SCr of 0.87 mg/dL). Liver Function Tests:  Recent Labs Lab 02/05/16 1913 02/06/16 0326  AST 21 21  ALT 13* 10*  ALKPHOS 50 42  BILITOT 0.3 0.3  PROT 6.5 5.4*  ALBUMIN 3.1* 2.5*   CBC:  Recent Labs Lab 02/05/16 1913 02/06/16 0326 02/07/16 0010 02/08/16 0450  WBC 28.5* 22.5* 22.3* 17.1*  NEUTROABS 20.5* 19.9* 19.3* 13.0*  HGB 13.4 11.1* 9.4* 9.8*  HCT 41.0 34.9* 28.6* 30.5*  MCV 82.3 82.9 81.7 82.0  PLT 436* 388 318 310   CBG:  Recent Labs Lab 02/06/16 2306 02/07/16 0731 02/07/16 1239 02/07/16 1658 02/07/16 2112  GLUCAP 127* 108* 195* 339* 182*   Thyroid function studies:  Recent Labs  02/06/16 0716  TSH 0.779   Sepsis Labs:  Recent Labs Lab 02/05/16 1913  02/05/16 2206 02/06/16 0326 02/06/16 0703 02/06/16 0931 02/06/16 1654 02/07/16 0010 02/08/16 0450  PROCALCITON  --   --   --   --  0.12  --  0.17  --  <0.10  WBC 28.5*  --   --  22.5*  --   --   --  22.3* 17.1*  LATICACIDVEN  --   < > 2.44*  --  2.7* 3.1*  --  1.3  --   < > = values in this interval not displayed.  Microbiology Recent Results (from the past 240 hour(s))  Blood Culture (routine x 2)     Status: None  (Preliminary result)   Collection Time: 02/05/16  7:43 PM  Result Value Ref Range Status   Specimen Description BLOOD RIGHT HAND  Final   Special Requests IN PEDIATRIC BOTTLE 1CC  Final   Culture NO GROWTH 2 DAYS  Final   Report Status PENDING  Incomplete  Blood Culture (routine x 2)     Status: None (Preliminary result)   Collection Time: 02/05/16  8:43 PM  Result Value Ref Range Status   Specimen Description BLOOD LEFT HAND  Final   Special Requests BOTTLES DRAWN AEROBIC ONLY 6CC  Final   Culture NO GROWTH 2 DAYS  Final   Report Status PENDING  Incomplete  Urine culture  Status: Abnormal   Collection Time: 02/05/16 10:33 PM  Result Value Ref Range Status   Specimen Description URINE, CLEAN CATCH  Final   Special Requests NONE  Final   Culture MULTIPLE SPECIES PRESENT, SUGGEST RECOLLECTION (A)  Final   Report Status 02/07/2016 FINAL  Final  MRSA PCR Screening     Status: None   Collection Time: 02/06/16 12:30 PM  Result Value Ref Range Status   MRSA by PCR NEGATIVE NEGATIVE Final    Comment:        The GeneXpert MRSA Assay (FDA approved for NASAL specimens only), is one component of a comprehensive MRSA colonization surveillance program. It is not intended to diagnose MRSA infection nor to guide or monitor treatment for MRSA infections.   Respiratory Panel by PCR     Status: Abnormal   Collection Time: 02/06/16  5:51 PM  Result Value Ref Range Status   Adenovirus NOT DETECTED NOT DETECTED Final   Coronavirus 229E NOT DETECTED NOT DETECTED Final   Coronavirus HKU1 NOT DETECTED NOT DETECTED Final   Coronavirus NL63 NOT DETECTED NOT DETECTED Final   Coronavirus OC43 NOT DETECTED NOT DETECTED Final   Metapneumovirus NOT DETECTED NOT DETECTED Final   Rhinovirus / Enterovirus NOT DETECTED NOT DETECTED Final   Influenza A NOT DETECTED NOT DETECTED Final   Influenza B NOT DETECTED NOT DETECTED Final   Parainfluenza Virus 1 NOT DETECTED NOT DETECTED Final   Parainfluenza  Virus 2 NOT DETECTED NOT DETECTED Final   Parainfluenza Virus 3 NOT DETECTED NOT DETECTED Final   Parainfluenza Virus 4 NOT DETECTED NOT DETECTED Final   Respiratory Syncytial Virus DETECTED (A) NOT DETECTED Final    Comment: CRITICAL RESULT CALLED TO, READ BACK BY AND VERIFIED WITH: K. Foecking RN 12:25 02/07/16 ([email or web address])    Bordetella pertussis NOT DETECTED NOT DETECTED Final   Chlamydophila pneumoniae NOT DETECTED NOT DETECTED Final   Mycoplasma pneumoniae NOT DETECTED NOT DETECTED Final    Radiology: Ct Chest High Resolution  Result Date: 02/06/2016 CLINICAL DATA:  75 year old female with history of interstitial lung disease. Followup study. EXAM: CT CHEST WITHOUT CONTRAST TECHNIQUE: Multidetector CT imaging of the chest was performed following the standard protocol without intravenous contrast. High resolution imaging of the lungs, as well as inspiratory and expiratory imaging, was performed. COMPARISON:  Chest CT 01/25/2016. FINDINGS: Cardiovascular: Heart size is normal. There is no significant pericardial fluid, thickening or pericardial calcification. There is aortic atherosclerosis, as well as atherosclerosis of the great vessels of the mediastinum and the coronary arteries, including calcified atherosclerotic plaque in the left main, left anterior descending, left circumflex and right coronary arteries. Mediastinum/Nodes: There are multiple borderline enlarged and enlarged mediastinal and bilateral hilar lymph nodes, similar to prior examinations, measuring up to 17 mm in short axis in the right paratracheal nodal station. These are presumably chronic and reactive in the setting of interstitial lung disease. Esophagus is unremarkable in appearance. No axillary lymphadenopathy. Lungs/Pleura: High-resolution images again demonstrate widespread areas of thickening of the peribronchovascular interstitium, subpleural reticulation, parenchymal banding, traction bronchiectasis and extensive  honeycombing. These findings have a definitive craniocaudal gradient. 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 Inspiratory and expiratory imaging is unremarkable. There is no acute consolidative airspace disease and no pleural effusions. No definite suspicious appearing pulmonary nodules or masses are noted. Upper Abdomen: Aortic  atherosclerosis. Musculoskeletal: There are no aggressive appearing lytic or blastic lesions noted in the visualized portions of the skeleton. IMPRESSION: 1. 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 2. Aortic atherosclerosis, in addition to left main and 3 vessel coronary artery disease. Please note that although the presence of coronary artery calcium documents the presence of coronary artery disease, the severity of this disease and any potential stenosis cannot be assessed on this non-gated CT examination. Assessment for potential risk factor modification, dietary therapy or pharmacologic therapy may be warranted, if clinically indicated. Electronically Signed   By: Trudie Reed M.D.   On: 02/06/2016 19:06    Medications:   . aspirin EC  81 mg Oral Daily  . ceFEPime (MAXIPIME) IV  1 g Intravenous Q8H  . enoxaparin (LOVENOX) injection  40 mg Subcutaneous Daily  . famotidine  20 mg Oral QHS  . insulin aspart  0-9 Units Subcutaneous TID WC  . levothyroxine  50 mcg Oral QAC breakfast  . megestrol  200 mg Oral Daily  . methylPREDNISolone (SOLU-MEDROL) injection  40 mg Intravenous Daily  . pantoprazole  40 mg Oral Daily   Continuous Infusions: . sodium chloride 10 mL/hr at 02/07/16 1610   Patient showing signs of improvement and  medical decision making is moderate making this a level II visit.   LOS: 2 days   Elexis Pollak  [facility] Pager [phone]. If unable to reach me by pager, please call my cell phone at [phone].  *Please refer to [email or web address], password [email or web address] to get updated schedule on who will round on this patient, as hospitalists switch teams weekly. If 7PM-7AM, please contact night-coverage at [email or web address], password [email or web address] for any overnight needs.  02/08/2016, 7:29 AM

## 2016-02-08 NOTE — Progress Notes (Signed)
Report given to RN on unit [facility] [place], pt will transfer to 5W06. Pt already informed of new room number so she can inform her family.

## 2016-02-08 NOTE — Progress Notes (Signed)
Pt arrived to the floor at 1750. Pt a+ox4, hr regular, ZO:[id number]:CLEAR 2L O2. VITALS STABLE, WILL CTM

## 2016-02-09 ENCOUNTER — Encounter (HOSPITAL_COMMUNITY): Payer: Self-pay | Admitting: Internal Medicine

## 2016-02-09 DIAGNOSIS — R768 Other specified abnormal immunological findings in serum: Secondary | ICD-10-CM

## 2016-02-09 HISTORY — DX: Other specified abnormal immunological findings in serum: R76.8

## 2016-02-09 LAB — BASIC METABOLIC PANEL
Anion gap: 8 (ref 5–15)
BUN: 22 mg/dL — AB (ref 6–20)
CHLORIDE: 102 mmol/L (ref 101–111)
CO2: 27 mmol/L (ref 22–32)
Calcium: 8.1 mg/dL — ABNORMAL LOW (ref 8.9–10.3)
Creatinine, Ser: 0.95 mg/dL (ref 0.44–1.00)
GFR calc Af Amer: >60 mL/min (ref >60)
GFR calc non Af Amer: 58 mL/min — ABNORMAL LOW (ref >60)
Glucose, Bld: 159 mg/dL — ABNORMAL HIGH (ref 65–99)
POTASSIUM: 4.1 mmol/L (ref 3.5–5.1)
SODIUM: 137 mmol/L (ref 135–145)

## 2016-02-09 LAB — CBC
HEMATOCRIT: 31 % — AB (ref 36.0–46.0)
HEMOGLOBIN: 10.1 g/dL — AB (ref 12.0–15.0)
MCH: 26.4 pg (ref 26.0–34.0)
MCHC: 32.6 g/dL (ref 30.0–36.0)
MCV: 81.2 fL (ref 78.0–100.0)
Platelets: 322 10*3/uL (ref 150–400)
RBC: 3.82 MIL/uL — AB (ref 3.87–5.11)
RDW: 16.6 % — ABNORMAL HIGH (ref 11.5–15.5)
WBC: 15.1 10*3/uL — ABNORMAL HIGH (ref 4.0–10.5)

## 2016-02-09 LAB — SJOGRENS SYNDROME-A EXTRACTABLE NUCLEAR ANTIBODY

## 2016-02-09 LAB — GLUCOSE, CAPILLARY
GLUCOSE-CAPILLARY: 120 mg/dL — AB (ref 65–99)
GLUCOSE-CAPILLARY: 164 mg/dL — AB (ref 65–99)

## 2016-02-09 LAB — SJOGRENS SYNDROME-B EXTRACTABLE NUCLEAR ANTIBODY

## 2016-02-09 MED ORDER — PREDNISONE 20 MG PO TABS: 40.0000 mg | ORAL_TABLET | Freq: Every day | ORAL | 0 refills | Status: DC

## 2016-02-09 MED ORDER — LEVOFLOXACIN 750 MG PO TABS
750.0000 mg | ORAL_TABLET | Freq: Every day | ORAL | 0 refills | Status: DC
Start: 2016-02-10 — End: 2016-03-01

## 2016-02-09 NOTE — Discharge Summary (Signed)
Physician Discharge Summary  Kim [name] AVW:[id number] DOB: 06-Jul-1940 DOA: 02/05/2016  PCP: Geraldo PitterBLAND,VEITA J, MD  Admit date: 02/05/2016 Discharge date: 02/09/2016  Admitted From: Home Discharge disposition: Home   Recommendations for Outpatient Follow-Up:   1. Recommend follow-up with PCP in one week to ensure resolution of symptoms. 2. Note: Porcelain gallbladder incidentally discovered. PCP, please consider outpatient surgery referral if warranted.  Discharge Diagnosis:   Principal Problem:    Sepsis (HCC) Active Problems:    Diabetes mellitus type 2 in obese (HCC)    HTN (hypertension)    Upper airway cough syndrome    Postinflammatory pulmonary fibrosis (history of)    Acute respiratory failure with hypoxia (HCC)    Hypothyroidism    HCAP (healthcare-associated pneumonia)    Porcelain gallbladder    Acute bronchiolitis due to respiratory syncytial virus (RSV)  Discharge Condition: Improved.  Diet recommendation: Low sodium, heart healthy.  Carbohydrate-modified.    History of Present Illness:   Kim [name] is an 75 y.o. female with a PMH of interstitial lung disease, recent hospitalization for treatment of dyspnea secondary to pneumonia, discharged on a prednisone taper, who was readmitted 02/05/16 with worsening shortness of breath. In the ED, the patient was found to be tachycardic with elevated serum lactate. CT of the abdomen showed an incidentally discovered calcified call bladder with worsening infiltrates in the lower parts of the lungs.   Hospital Course by Problem:   Principal Problem:   Sepsis (HCC) secondary to HCAP from RSV/Acute on chronic respiratory failure Blood pressure stablized and lactic acid cleared after fluid volume resuscitation on admission. Evaluated by pulmonologist 02/06/16. High resolution CT done 02/06/16, personally reviewed, which showed findings consistent with UIP. MRSA PCR negative. Respiratory virus panel  positive for RSV. Treatment is primarily supportive. Secondary bacterial infection is possible, so she was treated with empiric vancomycin and cefepime while in the hospital. Vancomycin was subsequently discontinued after her MRSA nasal swab was negative. She'll be discharged on Levaquin for an additional 3 days of therapy..  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 LFTs okay.  Abdominal exam unimpressive. Consider surgery evaluation, which can likely be done as an outpatient.    Diabetes mellitus type 2 in obese (HCC) Resume metformin at discharge.    HTN (hypertension) Continue losartan/HCTZ.    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 ANA was positive, homogenous pattern, 1:80, but other workup negative. Will need 3 weeks of steroids. Discharged on 40 mg of prednisone daily and wean by 10 mg per week until off. Recommend close outpatient follow-up with pulmonology.    Hypothyroidism Continue Synthroid.    Aortic atherosclerosis/CAD Noted on high resolution CT. Troponin 0.03. Continue aspirin.   Medical Consultants:    Pulmonology   Discharge Exam:   Vitals:   02/09/16 0051 02/09/16 0521  BP: (!) 144/59 (!) 142/56  Pulse: 83 67  Resp:  18  Temp:  99.1 F (37.3 C)   Vitals:   02/08/16 2203 02/09/16 0050 02/09/16 0051 02/09/16 0521  BP: (!) 164/56  (!) 144/59 (!) 142/56  Pulse: (!) 105  83 67  Resp: 20   18  Temp: 98.4 F (36.9 C)   99.1 F (37.3 C)  TempSrc: Oral   Oral  SpO2: 95% (!) 87% 96% 100%  Weight:      Height:       General exam: Appears calm and comfortable.  Respiratory system: Bilateral crackles in the bases, normal respiratory effort. Cardiovascular system: S1 & S2 heard, tachycardic rate, regular rhythm. No JVD,  rubs, gallops or clicks. No murmurs. Gastrointestinal system: Abdomen is  nondistended, soft and nontender. No organomegaly or masses felt. Normal bowel sounds heard. Central nervous system:  Alert, oriented 2. No focal neurological deficits. Extremities: No clubbing,  or cyanosis. No edema. Skin: No rashes, lesions or ulcers. Psychiatry: Judgement and insight appear normal. Mood & affect appropriate.    The results of significant diagnostics from this hospitalization (including imaging, microbiology, ancillary and laboratory) are listed below for reference.     Procedures and Diagnostic Studies:   Ct Chest High Resolution  Result Date: 02/06/2016 CLINICAL DATA:  75 year old female with history of interstitial lung disease. Followup study. EXAM: CT CHEST WITHOUT CONTRAST TECHNIQUE: Multidetector CT imaging of the chest was performed following the standard protocol without intravenous contrast. High resolution imaging of the lungs, as well as inspiratory and expiratory imaging, was performed. COMPARISON:  Chest CT 01/25/2016. FINDINGS: Cardiovascular: Heart size is normal. There is no significant pericardial fluid, thickening or pericardial calcification. There is aortic atherosclerosis, as well as atherosclerosis of the great vessels of the mediastinum and the coronary arteries, including calcified atherosclerotic plaque in the left main, left anterior descending, left circumflex and right coronary arteries. Mediastinum/Nodes: There are multiple borderline enlarged and enlarged mediastinal and bilateral hilar lymph nodes, similar to prior examinations, measuring up to 17 mm in short axis in the right paratracheal nodal station. These are presumably chronic and reactive in the setting of interstitial lung disease. Esophagus is unremarkable in appearance. No axillary lymphadenopathy. Lungs/Pleura: High-resolution images again demonstrate widespread areas of thickening of the peribronchovascular interstitium, subpleural reticulation, parenchymal banding, traction  bronchiectasis and extensive honeycombing. These findings have a definitive craniocaudal gradient. 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 Inspiratory and expiratory imaging is unremarkable. There is no acute consolidative airspace disease and no pleural effusions. No definite suspicious appearing pulmonary nodules or masses are noted. Upper Abdomen: Aortic atherosclerosis. Musculoskeletal: There are no aggressive appearing lytic or blastic lesions noted in the visualized portions of the skeleton. IMPRESSION: 1. 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 2. Aortic atherosclerosis, in addition to left main and 3 vessel coronary artery disease. Please note that although the presence of coronary artery calcium documents the presence of coronary artery disease, the severity of this disease and any potential stenosis cannot be assessed on this non-gated CT examination. Assessment for potential risk factor modification, dietary therapy or pharmacologic therapy may be warranted, if clinically indicated. Electronically Signed   By: Trudie Reed M.D.   On: 02/06/2016 19:06   Dg Chest Portable 1 View  Result Date: 02/05/2016 CLINICAL DATA:  Shortness of breath and right-sided back pain for 2 weeks. EXAM: PORTABLE CHEST 1 VIEW COMPARISON:  Chest x-ray a 01/25/2016 and chest CT 01/24/2014 FINDINGS: Chronic severe interstitial lung disease without definite superimposed acute pulmonary process. No pleural effusions. The heart is normal in size stable. IMPRESSION: Severe chronic lung disease without definite acute overlying pulmonary process. Electronically Signed   By: Rudie Meyer M.D.   On: 02/05/2016 20:39   Ct Renal Stone Study  Result Date: 02/05/2016 CLINICAL DATA:  Right flank pain EXAM: CT ABDOMEN AND PELVIS WITHOUT CONTRAST TECHNIQUE: Multidetector CT imaging of the abdomen and pelvis was performed following the standard protocol without IV contrast. COMPARISON:  10/03/2014 FINDINGS: Lower chest: Respiratory motion artifact limits evaluation of lung base parenchyma. Extensive fibrosis is again visualized at the bilateral lung bases. Possible ground-glass density at the right greater than left lung base could relate to acute superimposed inflammation or infection. No large pleural effusion. Heart size nonenlarged. Coronary artery calcifications. Hepatobiliary: No biliary dilatation. No focal hepatic abnormality. Punctate calcifications within the gallbladder and possibly within the wall of the gallbladder. No wall thickening. Pancreas: Unremarkable. No pancreatic ductal dilatation or surrounding inflammatory changes. Spleen: Normal in size without focal abnormality. Adrenals/Urinary Tract: Adrenal glands are within normal limits. Low-density foci in the right kidney compatible with cysts, unchanged. No hydronephrosis. No calcified stones along the course of the ureter. Bladder is unremarkable. Stomach/Bowel: Stomach is nonenlarged. No dilated small bowel. Large amount of stool throughout the colon. Appendix not well identified. Moderate to large retained feces in the rectum Vascular/Lymphatic: Aortic atherosclerosis. No enlarged abdominal or pelvic lymph nodes. Reproductive: Status post hysterectomy. No adnexal masses. Other: No free air or free fluid. Numerous surgical clips adjacent to the distal aorta and along the iliac vessels. Musculoskeletal: Degenerative changes. No acute osseous abnormality. Partially calcified fatty mass within the anterior abdominal wall fat again visualized and unchanged. IMPRESSION: 1. Respiratory motion artifact limits the exam. Extensive  fibrosis and honeycombing within the bilateral lung bases. Suspect that there is mild superimposed ground-glass density suggesting acute inflammation or infection on chronic change. No effusion. 2. No evidence for renal stone or hydronephrosis. 3. Peripherally oriented gallbladder calcifications suggests gallbladder wall calcification as opposed to intraluminal stones. No biliary dilatation. Electronically Signed   By: Jasmine Pang M.D.   On: 02/05/2016 23:35     Labs:   Basic Metabolic Panel:  Recent Labs Lab 02/05/16 1913 02/06/16 0326 02/07/16 0010 02/08/16 0450 02/09/16 0357  NA 137 134* 136  --  137  K 4.2 4.4 4.2  --  4.1  CL 100* 101 105  --  102  CO2 25 25 25   --  27  GLUCOSE 94 185* 199*  --  159*  BUN 16 15 16   --  22*  CREATININE 0.91 0.97 0.87  --  0.95  CALCIUM 8.8* 7.9* 7.9*  --  8.1*  MG  --   --  1.9 2.0  --   PHOS  --   --  2.8 3.3  --    GFR Estimated Creatinine Clearance: 54.3 mL/min (by C-G formula based on SCr of 0.95 mg/dL). Liver Function Tests:  Recent Labs Lab 02/05/16 1913 02/06/16 0326  AST 21 21  ALT 13* 10*  ALKPHOS 50 42  BILITOT 0.3 0.3  PROT 6.5 5.4*  ALBUMIN 3.1* 2.5*   CBC:  Recent Labs Lab 02/05/16 1913 02/06/16 0326 02/07/16 0010 02/08/16 0450 02/09/16 0357  WBC 28.5* 22.5* 22.3* 17.1* 15.1*  NEUTROABS 20.5* 19.9* 19.3* 13.0*  --   HGB 13.4 11.1* 9.4* 9.8* 10.1*  HCT 41.0 34.9* 28.6* 30.5* 31.0*  MCV 82.3 82.9 81.7 82.0 81.2  PLT 436* 388 318 310 322   Cardiac Enzymes:  Recent Labs Lab 02/07/16 0010  TROPONINI 0.03*   CBG:  Recent Labs Lab 02/08/16 1203 02/08/16 1641 02/08/16 2159 02/09/16 0830 02/09/16 1203  GLUCAP 164* 265* 190* 120* 164*   Microbiology Recent Results (from the past 240 hour(s))  Blood Culture (  routine x 2)     Status: None (Preliminary result)   Collection Time: 02/05/16  7:43 PM  Result Value Ref Range Status   Specimen Description BLOOD RIGHT HAND  Final   Special Requests IN  PEDIATRIC BOTTLE 1CC  Final   Culture NO GROWTH 3 DAYS  Final   Report Status PENDING  Incomplete  Blood Culture (routine x 2)     Status: None (Preliminary result)   Collection Time: 02/05/16  8:43 PM  Result Value Ref Range Status   Specimen Description BLOOD LEFT HAND  Final   Special Requests BOTTLES DRAWN AEROBIC ONLY 6CC  Final   Culture NO GROWTH 3 DAYS  Final   Report Status PENDING  Incomplete  Urine culture     Status: Abnormal   Collection Time: 02/05/16 10:33 PM  Result Value Ref Range Status   Specimen Description URINE, CLEAN CATCH  Final   Special Requests NONE  Final   Culture MULTIPLE SPECIES PRESENT, SUGGEST RECOLLECTION (A)  Final   Report Status 02/07/2016 FINAL  Final  MRSA PCR Screening     Status: None   Collection Time: 02/06/16 12:30 PM  Result Value Ref Range Status   MRSA by PCR NEGATIVE NEGATIVE Final    Comment:        The GeneXpert MRSA Assay (FDA approved for NASAL specimens only), is one component of a comprehensive MRSA colonization surveillance program. It is not intended to diagnose MRSA infection nor to guide or monitor treatment for MRSA infections.   Respiratory Panel by PCR     Status: Abnormal   Collection Time: 02/06/16  5:51 PM  Result Value Ref Range Status   Adenovirus NOT DETECTED NOT DETECTED Final   Coronavirus 229E NOT DETECTED NOT DETECTED Final   Coronavirus HKU1 NOT DETECTED NOT DETECTED Final   Coronavirus NL63 NOT DETECTED NOT DETECTED Final   Coronavirus OC43 NOT DETECTED NOT DETECTED Final   Metapneumovirus NOT DETECTED NOT DETECTED Final   Rhinovirus / Enterovirus NOT DETECTED NOT DETECTED Final   Influenza A NOT DETECTED NOT DETECTED Final   Influenza B NOT DETECTED NOT DETECTED Final   Parainfluenza Virus 1 NOT DETECTED NOT DETECTED Final   Parainfluenza Virus 2 NOT DETECTED NOT DETECTED Final   Parainfluenza Virus 3 NOT DETECTED NOT DETECTED Final   Parainfluenza Virus 4 NOT DETECTED NOT DETECTED Final    Respiratory Syncytial Virus DETECTED (A) NOT DETECTED Final    Comment: CRITICAL RESULT CALLED TO, READ BACK BY AND VERIFIED WITH: K. Foecking RN 12:25 02/07/16 ([email or web address])    Bordetella pertussis NOT DETECTED NOT DETECTED Final   Chlamydophila pneumoniae NOT DETECTED NOT DETECTED Final   Mycoplasma pneumoniae NOT DETECTED NOT DETECTED Final     Discharge Instructions:   Discharge Instructions    Call MD for:  difficulty breathing, headache or visual disturbances    Complete by:  As directed    Call MD for:  extreme fatigue    Complete by:  As directed    Call MD for:  temperature >100.4    Complete by:  As directed    Diet - low sodium heart healthy    Complete by:  As directed    Diet Carb Modified    Complete by:  As directed    Increase activity slowly    Complete by:  As directed      Allergies as of 02/09/2016      Reactions   Codeine Nausea And Vomiting   "Takes  my appetite"   Penicillins Rash   Has patient had a PCN reaction causing immediate rash, facial/tongue/throat swelling, SOB or lightheadedness with hypotension: Yes Has patient had a PCN reaction causing severe rash involving mucus membranes or skin necrosis: No Has patient had a PCN reaction that required hospitalization No Has patient had a PCN reaction occurring within the last 10 years: No If all of the above answers are "NO", then may proceed with Cephalosporin use.      Medication List    TAKE these medications   acetaminophen 500 MG tablet Commonly known as:  TYLENOL Take 500 mg by mouth daily.   aspirin EC 81 MG tablet Take 81 mg by mouth daily.   famotidine 20 MG tablet Commonly known as:  PEPCID Take 1 tablet (20 mg total) by mouth at bedtime.   guaiFENesin-codeine 100-10 MG/5ML syrup Commonly known as:  ROBITUSSIN AC Take 5-10 mLs by mouth every 8 (eight) hours as needed for cough.   levofloxacin 750 MG tablet Commonly known as:  LEVAQUIN Take 1 tablet (750 mg total) by mouth  daily. Start taking on:  02/10/2016 What changed:  medication strength  how much to take   levothyroxine 50 MCG tablet Commonly known as:  SYNTHROID, LEVOTHROID Take 50 mcg by mouth daily before breakfast.   losartan-hydrochlorothiazide 100-12.5 MG tablet Commonly known as:  HYZAAR Take 1 tablet by mouth daily.   megestrol 40 MG/ML suspension Commonly known as:  MEGACE Take 200 mg by mouth daily.   metFORMIN 500 MG tablet Commonly known as:  GLUCOPHAGE TAKE 1 TABLET BY MOUTH TWICE A DAY   pantoprazole 40 MG tablet Commonly known as:  PROTONIX Take 1 tablet (40 mg total) by mouth daily. Take 30-60 min before first meal of the day   predniSONE 20 MG tablet Commonly known as:  DELTASONE Take 2 tablets (40 mg total) by mouth daily with breakfast. Start taking on:  02/10/2016 What changed:  medication strength  how much to take  how to take this  when to take this  additional instructions      Follow-up Information    BLAND,VEITA J, MD. Schedule an appointment as soon as possible for a visit in 1 week(s).   Specialty:  Family Medicine Why:  Hospital follow up. Contact information: [address] [place] [address] [phone]            Time coordinating discharge: > 35 minutes.  Signed:  Auna Mikkelsen  Pager [phone] [facility] 02/09/2016, 1:51 PM

## 2016-02-09 NOTE — Assessment & Plan Note (Signed)
Homogenous pattern 1:80 titer.

## 2016-02-09 NOTE — Care Management Note (Signed)
Case Management Note  Patient Details  Name: Hadassah Paisommie L Vanschaick MRN: [id number] Date of Birth: Jan 23, 1941  Subjective/Objective:                 Patient transferred form 4E within 24 hours. Will DC to home today. Active for PT with [facility] from DC 12/3. Added RN to services. Notified [facility] clinical liaison Lupita LeashDonna of DC and services needed. [facility]; no D[facility] needs.    Action/Plan[facility]  [facility]   Expected Discharge Date:                  Expected Discharge Plan:  Home w Home Health Services  In-House Referral:     Discharge planning Services  CM Consult  Post Acute Care Choice:  Home Health, Resumption of Svcs/PTA Provider Choice offered to:     DME Arranged:    DME Agency:     HH Arranged:  RN, PT HH Agency:  [facility]  Status of Service:  Completed, signed off  If discussed at Long Length of Stay Meetings, dates discussed:    Additional Comments:  Lawerance SabalDebbie Chaunta Bejarano, RN 02/09/2016, 11:58 AM

## 2016-02-09 NOTE — Discharge Instructions (Signed)
Community-Acquired Pneumonia, Adult °Introduction °Pneumonia is an infection of the lungs. One type of pneumonia can happen while a person is in a hospital. A different type can happen when a person is not in a hospital (community-acquired pneumonia). It is easy for this kind to spread from person to person. It can spread to you if you breathe near an infected person who coughs or sneezes. Some symptoms include: °· A dry cough. °· A wet (productive) cough. °· Fever. °· Sweating. °· Chest pain. °Follow these instructions at home: °· Take over-the-counter and prescription medicines only as told by your doctor. °¨ Only take cough medicine if you are losing sleep. °¨ If you were prescribed an antibiotic medicine, take it as told by your doctor. Do not stop taking the antibiotic even if you start to feel better. °· Sleep with your head and neck raised (elevated). You can do this by putting a few pillows under your head, or you can sleep in a recliner. °· Do not use tobacco products. These include cigarettes, chewing tobacco, and e-cigarettes. If you need help quitting, ask your doctor. °· Drink enough water to keep your pee (urine) clear or pale yellow. °A shot (vaccine) can help prevent pneumonia. Shots are often suggested for: °· People older than 74 years of age. °· People older than 74 years of age: °¨ Who are having cancer treatment. °¨ Who have long-term (chronic) lung disease. °¨ Who have problems with their body's defense system (immune system). °You may also prevent pneumonia if you take these actions: °· Get the flu (influenza) shot every year. °· Go to the dentist as often as told. °· Wash your hands often. If soap and water are not available, use hand sanitizer. °Contact a doctor if: °· You have a fever. °· You lose sleep because your cough medicine does not help. °Get help right away if: °· You are short of breath and it gets worse. °· You have more chest pain. °· Your sickness gets worse. This is very  serious if: °¨ You are an older adult. °¨ Your body's defense system is weak. °· You cough up blood. °This information is not intended to replace advice given to you by your health care provider. Make sure you discuss any questions you have with your health care provider. °Document Released: 07/31/2007 Document Revised: 07/20/2015 Document Reviewed: 06/08/2014 °© 2017 Elsevier ° °

## 2016-02-09 NOTE — Progress Notes (Signed)
Patient was discharged home by MD order; discharged instructions review and give to patient with care notes; IV DIC;  patient will be escorted to the car by nurse tech via wheelchair.  

## 2016-02-10 LAB — CULTURE, BLOOD (ROUTINE X 2)
CULTURE: NO GROWTH
Culture: NO GROWTH

## 2016-03-01 ENCOUNTER — Encounter: Payer: Self-pay | Admitting: Internal Medicine

## 2016-03-01 ENCOUNTER — Other Ambulatory Visit (INDEPENDENT_AMBULATORY_CARE_PROVIDER_SITE_OTHER): Payer: Medicare Other

## 2016-03-01 ENCOUNTER — Ambulatory Visit (INDEPENDENT_AMBULATORY_CARE_PROVIDER_SITE_OTHER): Payer: Medicare Other | Admitting: Internal Medicine

## 2016-03-01 VITALS — BP 120/62 | HR 104 | Ht 69.0 in | Wt 150.0 lb

## 2016-03-01 DIAGNOSIS — J8410 Pulmonary fibrosis, unspecified: Secondary | ICD-10-CM

## 2016-03-01 DIAGNOSIS — R058 Other specified cough: Secondary | ICD-10-CM

## 2016-03-01 DIAGNOSIS — R05 Cough: Secondary | ICD-10-CM | POA: Diagnosis not present

## 2016-03-01 LAB — BRAIN NATRIURETIC PEPTIDE: Pro B Natriuretic peptide (BNP): 31 pg/mL (ref 0.0–100.0)

## 2016-03-01 LAB — CBC WITH DIFFERENTIAL/PLATELET
BASOS ABS: 0.1 10*3/uL (ref 0.0–0.1)
Basophils Relative: 0.3 % (ref 0.0–3.0)
EOS ABS: 0 10*3/uL (ref 0.0–0.7)
Eosinophils Relative: 0.2 % (ref 0.0–5.0)
HCT: 40.3 % (ref 36.0–46.0)
Hemoglobin: 13.2 g/dL (ref 12.0–15.0)
LYMPHS ABS: 2.7 10*3/uL (ref 0.7–4.0)
Lymphocytes Relative: 15.9 % (ref 12.0–46.0)
MCHC: 32.7 g/dL (ref 30.0–36.0)
MCV: 82.5 fl (ref 78.0–100.0)
Monocytes Absolute: 0.1 10*3/uL (ref 0.1–1.0)
Monocytes Relative: 0.4 % — ABNORMAL LOW (ref 3.0–12.0)
NEUTROS ABS: 14.2 10*3/uL — AB (ref 1.4–7.7)
NEUTROS PCT: 83.2 % — AB (ref 43.0–77.0)
PLATELETS: 383 10*3/uL (ref 150.0–400.0)
RBC: 4.89 Mil/uL (ref 3.87–5.11)
RDW: 16.8 % — ABNORMAL HIGH (ref 11.5–15.5)
WBC: 17 10*3/uL — ABNORMAL HIGH (ref 4.0–10.5)

## 2016-03-01 LAB — SEDIMENTATION RATE: Sed Rate: 84 mm/hr — ABNORMAL HIGH (ref 0–30)

## 2016-03-01 MED ORDER — PREDNISONE 10 MG PO TABS: ORAL_TABLET | ORAL | 1 refills | Status: DC

## 2016-03-01 NOTE — Patient Instructions (Addendum)
Prednisone 10 mg take x 2  in am with breakfast until better then  1 daily until return   Pantoprazole (protonix) 40 mg   Take  30-60 min before first meal of the day and Pepcid (famotidine)  20 mg one @  bedtime until return to office - this is the best way to tell whether stomach acid is contributing to your problem.    GERD (REFLUX)  is an extremely common cause of respiratory symptoms just like yours , many times with no obvious heartburn at all.    It can be treated with medication, but also with lifestyle changes including elevation of the head of your bed (ideally with 6 inch  bed blocks),  Smoking cessation, avoidance of late meals, excessive alcohol, and avoid fatty foods, chocolate, peppermint, colas, red wine, and acidic juices such as orange juice.  NO MINT OR MENTHOL PRODUCTS SO NO COUGH DROPS   USE SUGARLESS CANDY INSTEAD (Jolley ranchers or Stover's or Life Savers) or even ice chips will also do - the key is to swallow to prevent all throat clearing. NO OIL BASED VITAMINS - use powdered substitutes.    Please remember to go to the lab  department downstairs for your tests - we will call you with the results when they are available.  See Tammy NP w/in 2 weeks with all your medications, even over the counter meds, separated in two separate bags, the ones you take no matter what vs the ones you stop once you feel better and take only as needed when you feel you need them.   Tammy  will generate for you a new user friendly medication calendar that will put us all on the same page re: your medication use.     Without this process, it simply isn't possible to assure that we are providing  your outpatient care  with  the attention to detail we feel you deserve.   If we cannot assure that you're getting that kind of care,  then we cannot manage your problem effectively from this clinic.  Once you have seen Tammy and we are sure that we're all on the same page with your medication use she  will arrange follow up with me.

## 2016-03-01 NOTE — Progress Notes (Signed)
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 Pulmonary Consult    Referred per Dr. Parke Simmers. Pt c/o cough x 2 months- non prod and esp worse at night and when exposed to strong smells.   indolent onset dry coughing, worse x 2 months with  day > noct  On hyzar since at least 2011 Walks up to 4 blocks when cool s  sob Main problem is smelling cigs/ cleaning solutions.  No previous hx ca, chemo, connective tissue dz, exp to macrodantin or amiodarone to her knowledge rec Pantoprazole (protonix) 40 mg   Take 30-60 min before first meal of the day and Pepcid 20 mg one bedtime until return to office - this is the best way to tell whether stomach acid is contributing to your problem.   GERD diet    12/06/2013 f/u ov/Logan Baltimore re: PF presumably from remote ali related to chemical exp/ cough  Chief Complaint  Patient presents with  . Follow-up    Pt states that her cough has improved some since the last visit. She states that she mainly only coughs at night or if she gets hot.   coughs some still  But mostly p supper, not while sleeping unless gets hot  Walks up to 30 min, some inclines - no sob Not limited by breathing from desired activities rec Change pepcid to where you take it after supper to see if helps your night time cough We will refill your medications when they run out but don't stop  either acid suppressor as they may be helping your cough  Prevnar and flu shots given today     03/28/2014 f/u ov/Briannon Boggio re: PF from remote ALI Chief Complaint  Patient presents with  . Follow-up    Pt c/o occasional prod cough with clear mucus, no other complaints at this time.   noct cough better on pepcid p supper  rec To get the most out of exercise, you need to be continuously aware that you are short of breath, but never out of breath, for 30 minutes daily. As you improve, it will actually be easier for you to do the same amount of exercise  in  30 minutes so always push to the level where you are short of breath.   Please schedule a follow up visit in 3 months  > did not do    09/21/2015  Consultation Glenford Garis WU:[id number]/ no longer on any gerd rx / abn CTchest c/w UIP Chief Complaint  Patient presents with  . Pulmonary Consult    Referred by Dr Margaretmary Bayley for eval of abnormal ct chest. She c/o increased cough, esp at night. Cough is prod at times with clear sputum. She also c/o occ DOE "not too often".   Not limited by breathing from desired activities  But by knee pain so very sedentary Cough is worse after supper and hs and doesn't remember whether it flared related to when she stopped the gerd rx as the cough was indolent onset, minimally progressive and minimally productive x months s change with environment, weather or exertion rec Pantoprazole (protonix) 40 mg   Take  30-60 min before first meal of the day and Pepcid (famotidine)  20 mg one @  bedtime until return to office - this is the best way to tell whether stomach acid is contributing to your problem.   GERD diet     01/30/2016 NP Follow up : Post hospital follow up for PNA  Pt presents for an extended office visit . Recently admitted for CAP , ILD . She was treated with IV abx and steroids . CT chest was neg for PE, ILD changes w/ LUL and LLL GGO c/w PNA . Viral panel was neg   Discharged 2 days ago. She remains on Levaquin  and Prednisone taper .  She was started on O2 with walking due to desats.  Since discharge she is starting to feel some better.  Still weak but seems to be getting better. Cough is decreased some.  Has seen blood tingged mucus some but is better as well.  rec Finish Levaquin and Prednisone as directed.  Wear Oxygen 2l/m with walking .  Order for smaller O2 tank.  Follow up Dr. Sherene SiresWert  In 3-4 weeks with chest xray and As needed     03/01/2016  extended f/u ov/Jerie Basford re: PF/ now 02 dep sob room to room  Chief Complaint  Patient presents with  . Follow-up    Pt states still having quite a bit of coughing at night. Cough is occ prod with clear sputum. She has had nose bleeds.  She states her breathing has been worse since the last visit and she sometimes gets winded just walking from room to room at home.    doe x room to room even on 02 Taking ppi after eat  Transient improvement p steroids/ worse off them   .No obvious day to day or daytime variability or assoc excess/ purulent sputum or mucus plugs or hemoptysis or cp or chest tightness, subjective wheeze or overt sinus or hb symptoms. No unusual exp hx or h/o childhood pna/ asthma or knowledge of premature birth.  Sleeping ok without nocturnal  or early am exacerbation  of respiratory  c/o's or need for noct saba. Also denies any obvious fluctuation of symptoms with weather or environmental changes or other aggravating or alleviating factors except as outlined above   Current Medications, Allergies, Complete Past Medical History, Past Surgical History, Family History, and Social History were reviewed in [facility] electronic medical record.  ROS  The following are not active complaints unless bolded sore throat, dysphagia, dental problems, itching, sneezing,  nasal congestion or excess/ purulent secretions, ear ache,   fever, chills, sweats, unintended wt loss, classically pleuritic or exertional cp,  orthopnea pnd or leg swelling,  presyncope, palpitations, abdominal pain, anorexia, nausea, vomiting, diarrhea  or change in bowel or bladder habits, change in stools or urine, dysuria,hematuria,  rash, arthralgias, visual complaints, headache, numbness, weakness or ataxia or problems with walking or coordination,  change in mood/affect or memory.                  Objective:   Physical Exam  amb hoarse bf nad  Wt Readings from Last 3 Encounters:  03/01/16 150  lb (68 kg)  02/08/16 147 lb 4.3 oz (66.8 kg)  01/30/16 142 lb 9.6 oz (64.7 kg)    Vital signs reviewed  - Note on arrival 02 sats  98% on 2lpm        HEENT: upper dentures/ nl  turbinates, and oropharynx. Nl external ear canals without cough reflex   NECK :  without JVD/Nodes/TM/ nl carotid upstrokes bilaterally   LUNGS: no acc muscle use,     insp crackles bases bilaterally s cough on insp    CV:  RRR  no s3 or murmur or increase in P2, no edema   ABD:  soft and nontender with nl inspiratory excursion in the supine position. 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   Labs ordered/ reviewed:      Chemistry      Component Value Date/Time   NA 137 02/09/2016 0357   K 4.1 02/09/2016 0357   CL 102 02/09/2016 0357   CO2 27 02/09/2016 0357   BUN 22 (H) 02/09/2016 0357   CREATININE 0.95 02/09/2016 0357      Component Value Date/Time   CALCIUM 8.1 (L) 02/09/2016 0357   ALKPHOS 42 02/06/2016 0326   AST 21 02/06/2016 0326   ALT 10 (L)  02/06/2016 0326   BILITOT 0.3 02/06/2016 0326        Lab Results  Component Value Date   WBC 17.0 (H) 03/01/2016   HGB 13.2 03/01/2016   HCT 40.3 03/01/2016   MCV 82.5 03/01/2016   PLT 383.0 03/01/2016         Lab Results  Component Value Date   TSH 0.779 02/06/2016     Lab Results  Component Value Date   PROBNP 31.0 03/01/2016       Lab Results  Component Value Date   ESRSEDRATE 84 (H) 03/01/2016   ESRSEDRATE 35 (H) 02/06/2016   ESRSEDRATE 60 (H) 01/25/2016

## 2016-03-03 NOTE — Assessment & Plan Note (Addendum)
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 > seen on plain cxr 2006  - spirometry 11/18/ 2011  VC  1.74 (60%) - PFT's attempted 11/16/13 could not perform See CXR  slt worse 10/12/13 vs 2006 but no change on 08/23/15 - 10/22/2013  Walked RA x 3 laps @ 185 ft each stopped due to  End of study, moderate to fast sat 90% at end > rx just gerd rx - 12/06/2013  Walked RA x 3 laps @ 185 ft each stopped due to  End of study, fast pace, sats 91% at end - 03/28/2014  Walked RA x 2laps @ 185 ft each stopped due to  Back pain, moderate pace, sats 89% no sob  - CT chest 09/05/15 c/w UIP > rec max gerd rx/ f/u 6 mwalk  - ESR 84 03/01/2016 started on daily pred rx and repeated approp rx for gerd  Use of PPI is associated with improved survival time and with decreased radiologic fibrosis per King's study published in AJRCCM vol 184 p1390.  Dec 2011 and also may have other beneficial effects as per the latest review in [place] vol 193 N9987 Jun 20016.  This may not always be cause and effect, but given how universally unimpressive and expensive  all the other  Drugs developed to day  have been for pf,   rec start  rx ppi / diet/ lifestyle modification and f/u with serial walking sats and lung volumes for now to put more points on the curve / establish firm baseline before considering additional measures.   Clearly responds to prednisone assoc with elevated esr and no significant obvious connective tissue dz   The goal with a chronic steroid dependent illness is always arriving at the lowest effective dose that controls the disease/symptoms and not accepting a set "formula" which is based on statistics or guidelines that don't always take into account patient  variability or the natural hx of the dz in every individual patient, which may well vary over time.  For now therefore I recommend the patient maintain  Ceiling of 20 and a floor of 10 mg daiuly  Discussed in detail all the  indications, usual   Risks(elevated sugars in her case) and alternatives  relative to the benefits with patient who agrees to proceed with  Plan as outlined

## 2016-03-03 NOTE — Assessment & Plan Note (Addendum)
Still not understanding how to take her meds(ppi should be ac, not pc)  so needs to return with all meds in hand using a trust but verify approach to confirm accurate Medication  Reconciliation The principal here is that until we are certain that the  patients are doing what we've asked, it makes no sense to ask them to do more.   I had an extended discussion with the patient reviewing all relevant studies completed to date and  lasting 15 to 20 minutes of a 25 minute visit    Each maintenance medication was reviewed in detail including most importantly the difference between maintenance and prns and under what circumstances the prns are to be triggered using an action plan format that is not reflected in the computer generated alphabetically organized AVS.    Please see AVS for specific instructions unique to this visit that I personally wrote and verbalized to the the pt in detail and then reviewed with pt  by my nurse highlighting any  changes in therapy recommended at today's visit to their plan of care.

## 2016-03-04 NOTE — Progress Notes (Signed)
Spoke with pt and notified of results per Dr. Wert. Pt verbalized understanding and denied any questions. 

## 2016-03-18 ENCOUNTER — Encounter: Payer: Medicare Other | Admitting: Adult Health

## 2016-03-19 ENCOUNTER — Other Ambulatory Visit: Payer: Self-pay | Admitting: Internal Medicine

## 2016-04-02 ENCOUNTER — Other Ambulatory Visit: Payer: Self-pay | Admitting: Internal Medicine

## 2016-04-02 ENCOUNTER — Encounter: Payer: Medicare Other | Admitting: Adult Health

## 2016-04-09 ENCOUNTER — Ambulatory Visit (INDEPENDENT_AMBULATORY_CARE_PROVIDER_SITE_OTHER): Payer: Medicare Other | Admitting: Adult Health

## 2016-04-09 ENCOUNTER — Encounter: Payer: Self-pay | Admitting: Adult Health

## 2016-04-09 ENCOUNTER — Other Ambulatory Visit (INDEPENDENT_AMBULATORY_CARE_PROVIDER_SITE_OTHER): Payer: Medicare Other

## 2016-04-09 DIAGNOSIS — J9611 Chronic respiratory failure with hypoxia: Secondary | ICD-10-CM

## 2016-04-09 DIAGNOSIS — J8410 Pulmonary fibrosis, unspecified: Secondary | ICD-10-CM | POA: Diagnosis not present

## 2016-04-09 DIAGNOSIS — Z23 Encounter for immunization: Secondary | ICD-10-CM | POA: Diagnosis not present

## 2016-04-09 LAB — SEDIMENTATION RATE: Sed Rate: 81 mm/hr — ABNORMAL HIGH (ref 0–30)

## 2016-04-09 NOTE — Assessment & Plan Note (Addendum)
Recent flare with elevated ESR , improved with steroid challenge  Cont on low dose prednsione  Check ESR today .  Steroid edcuation given  Needs flu shot.   Plan  Patient Instructions  Continue on Prednisone 32m daily  Labs today .  Continue on Oxygen 2l/m  Order for POC tank .  Saline nasal spray /gel As needed  Nasal congestion  follow up Dr. WMelvyn Novas In 6-8 weeks and As needed   Follow med calendar closely and bring to each visit.  Flu shot today

## 2016-04-09 NOTE — Assessment & Plan Note (Signed)
Cont on O2 2l/m  Order for POC device

## 2016-04-09 NOTE — Addendum Note (Signed)
Addended by: Boone MasterJONES, Jymir Dunaj E on: 04/09/2016 12:06 PM   Modules accepted: Orders

## 2016-04-09 NOTE — Progress Notes (Signed)
_0  ID: Kim [name], female    DOB: 01-07-1941, 76 y.o.   MRN: [id number]  Chief Complaint  Patient presents with  . 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 > seen on plain cxr 2006  - spirometry 11/18/ 2011  VC  1.74 (60%) - PFT's attempted 11/16/13 could not perform See CXR  slt worse 10/12/13 vs 2006 but no change on 08/23/15 - 10/22/2013  Walked RA x 3 laps @ 185 ft each stopped due to  End of study, moderate to fast sat 90% at end > rx just gerd rx - 12/06/2013  Walked RA x 3 laps @ 185 ft each stopped due to  End of study, fast pace, sats 91% at end - 03/28/2014  Walked RA x 2laps @ 185 ft each stopped due to  Back pain, moderate pace, sats 89% no sob  - 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 Last visit. Patient was started on prednisone 20 mg daily Since last office visit. Patient is feeling better w/ less coughing . Does not feel as short of breath. She has decreased to 58m .of Prednisone about 1 week ago. No flare of cough or wheezing .  ESR was elevated at 84 last ov.   We reviewed all his medications organize them into a medication count with patient education. It appears he is take his medications correctly.  Wants flu shot today .   On O2 at 2l/m . Wants POC b/c tanks too heavy .    Allergies  Allergen Reactions  . Codeine Nausea And Vomiting    "Takes my appetite"  . Penicillins Rash    Has patient had a PCN reaction causing immediate rash, facial/tongue/throat swelling, SOB or lightheadedness with hypotension: Yes Has patient had a PCN reaction causing severe rash involving mucus membranes or skin necrosis: No Has patient had a PCN reaction that  required hospitalization No Has patient had a PCN reaction occurring within the last 10 years: No If all of the above answers are "NO", then may proceed with Cephalosporin use.     Immunization History  Administered Date(s) Administered  . Influenza,inj,Quad PF,36+ Mos 12/06/2013  . Pneumococcal Conjugate-13 12/06/2013    Past Medical History:  Diagnosis Date  . Acute bronchiolitis due to respiratory syncytial virus (RSV) 02/08/2016  . ANA positive 02/09/2016  . Constipation due to pain medication   . Diabetes mellitus without complication ([place]   . GERD (gastroesophageal reflux disease)   . Hyperlipemia   . Hypertension   . 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 Postinflammatory pulmonary fibrosis ([place]   . Seasonal allergies     Tobacco History: History  Smoking Status  . Former Smoker  . Packs/day: 0.50  . Years: 10.00  . Types: Cigarettes  . Quit date: 02/26/1988  Smokeless Tobacco  . Never Used   Counseling given: Not Answered   Outpatient Encounter Prescriptions as of 04/09/2016  Medication Sig  . acetaminophen (TYLENOL) 500 MG tablet Take 500 mg by mouth daily.   .Marland Kitchenaspirin EC 81 MG tablet Take 81 mg by mouth daily.  . famotidine (PEPCID) 20 MG tablet  TAKE 1 TABLET (20 MG TOTAL) BY MOUTH AT BEDTIME.  Marland Kitchen guaiFENesin-codeine (ROBITUSSIN AC) 100-10 MG/5ML syrup Take 5-10 mLs by mouth every 8 (eight) hours as needed for cough.  . levothyroxine (SYNTHROID, LEVOTHROID) 50 MCG tablet Take 50 mcg by mouth daily before breakfast.  . losartan-hydrochlorothiazide (HYZAAR) 100-12.5 MG per tablet Take 1 tablet by mouth daily.  . megestrol (MEGACE) 40 MG/ML suspension Take 200 mg by mouth daily.  . metFORMIN (GLUCOPHAGE) 500 MG tablet TAKE 1 TABLET BY MOUTH TWICE A DAY  . OXYGEN 2 lpm 24/7  [facility]  . pantoprazole (PROTONIX) 40 MG tablet Take 1 tablet (40 mg total) by mouth daily. Take 30-60 min before first meal of the day  . predniSONE (DELTASONE) 10 MG tablet Take   2 each am    Until better then 1 daily   No facility-administered encounter medications on file as of 04/09/2016.      Review of Systems  Constitutional:   No  weight loss, night sweats,  Fevers, chills,  +fatigue, or  lassitude.  HEENT:   No headaches,  Difficulty swallowing,  Tooth/dental problems, or  Sore throat,                No sneezing, itching, ear ache,  +nasal congestion, post nasal drip,   CV:  No chest pain,  Orthopnea, PND, swelling in lower extremities, anasarca, dizziness, palpitations, syncope.   GI  No heartburn, indigestion, abdominal pain, nausea, vomiting, diarrhea, change in bowel habits, loss of appetite, bloody stools.   Resp:    No wheezing.  No chest wall deformity  Skin: no rash or lesions.  GU: no dysuria, change in color of urine, no urgency or frequency.  No flank pain, no hematuria   MS:  No joint pain or swelling.  No decreased range of motion.  No back pain.    Physical Exam  BP 134/76 (BP Location: Right Arm, Patient Position: Sitting, Cuff Size: Normal)   Pulse 92   Ht _0  (1.753 m)   Wt 159 lb 9.6 oz (72.4 kg)   SpO2 96%   BMI 23.57 kg/m   GEN: A/Ox3; pleasant , NAD, chronically ill appearing on O2   HEENT:  [place]/AT,  EACs-clear, TMs-wnl, NOSE-clear, THROAT-clear, no lesions, no postnasal drip or exudate noted.   NECK:  Supple w/ fair ROM; no JVD; normal carotid impulses w/o bruits; no thyromegaly or nodules palpated; no lymphadenopathy.    RESP  Decreased BS in bases  no accessory muscle use, no dullness to percussion  CARD:  RRR, no m/r/g, no peripheral edema, pulses intact, no cyanosis or clubbing.  GI:   Soft & nt; nml bowel sounds; no organomegaly or masses detected.   Musco: Warm bil, no deformities or joint swelling noted.   Neuro: alert, no focal deficits noted.    Skin: Warm, no lesions or rashes  Psych:  No change in mood or affect. No depression or anxiety.  No memory loss.  Lab Results:  CBC    Component Value Date/Time     WBC 17.0 (H) 03/01/2016 1537   RBC 4.89 03/01/2016 1537   HGB 13.2 03/01/2016 1537   HCT 40.3 03/01/2016 1537   PLT 383.0 03/01/2016 1537   MCV 82.5 03/01/2016 1537   MCH 26.4 02/09/2016 0357   MCHC 32.7 03/01/2016 1537   RDW 16.8 (H) 03/01/2016 1537   LYMPHSABS 2.7 03/01/2016 1537   MONOABS 0.1 03/01/2016 1537   EOSABS 0.0 03/01/2016 1537   BASOSABS  0.1 03/01/2016 1537    BMET    Component Value Date/Time   NA 137 02/09/2016 0357   K 4.1 02/09/2016 0357   CL 102 02/09/2016 0357   CO2 27 02/09/2016 0357   GLUCOSE 159 (H) 02/09/2016 0357   BUN 22 (H) 02/09/2016 0357   CREATININE 0.95 02/09/2016 0357   CALCIUM 8.1 (L) 02/09/2016 0357   GFRNONAA 58 (L) 02/09/2016 0357   GFRAA >60 02/09/2016 0357    BNP    Component Value Date/Time   BNP 28.4 02/05/2016 1913    ProBNP    Component Value Date/Time   PROBNP 31.0 03/01/2016 1537    Imaging: No results found.   Assessment & Plan:   Postinflammatory pulmonary fibrosis (history of) Recent flare with elevated ESR , improved with steroid challenge  Cont on low dose prednsione  Check ESR today .  Steroid edcuation given  Needs flu shot.   Plan  Patient Instructions  Continue on Prednisone 32m daily  Labs today .  Continue on Oxygen 2l/m  Order for POC tank .  Saline nasal spray /gel As needed  Nasal congestion  follow up Dr. WMelvyn Novas In 6-8 weeks and As needed   Follow med calendar closely and bring to each visit.  Flu shot today      Chronic respiratory failure (HBacon Cont on O2 2l/m  Order for POC device      TRexene Edison NP 04/09/2016

## 2016-04-09 NOTE — Patient Instructions (Addendum)
Continue on Prednisone 10mg  daily  Labs today .  Continue on Oxygen 2l/m  Order for POC tank .  Saline nasal spray /gel As needed  Nasal congestion  follow up Dr. Sherene SiresWert  In 6-8 weeks and As needed   Follow med calendar closely and bring to each visit.  Flu shot today

## 2016-04-09 NOTE — Addendum Note (Signed)
Addended by: Boone MasterJONES, Luiza Carranco E on: 04/09/2016 01:29 PM   Modules accepted: Orders

## 2016-04-09 NOTE — Progress Notes (Signed)
Chart and office note reviewed in detail  > agree with a/p as outlined    

## 2016-04-10 NOTE — Progress Notes (Signed)
Spoke with patient and informed her to remain on prednisone. Pt did not have any additional questions. Nothing further is needed.

## 2016-04-10 NOTE — Addendum Note (Signed)
Addended by: Pamalee LeydenWIGGINS, Stefana Lodico J on: 04/10/2016 11:38 AM   Modules accepted: Orders

## 2016-05-04 ENCOUNTER — Other Ambulatory Visit: Payer: Self-pay | Admitting: Internal Medicine

## 2016-05-21 ENCOUNTER — Encounter: Payer: Self-pay | Admitting: Internal Medicine

## 2016-05-21 ENCOUNTER — Ambulatory Visit (INDEPENDENT_AMBULATORY_CARE_PROVIDER_SITE_OTHER): Payer: Medicare Other | Admitting: Internal Medicine

## 2016-05-21 VITALS — BP 138/80 | HR 98 | Wt 152.0 lb

## 2016-05-21 DIAGNOSIS — J9611 Chronic respiratory failure with hypoxia: Secondary | ICD-10-CM | POA: Diagnosis not present

## 2016-05-21 DIAGNOSIS — J8410 Pulmonary fibrosis, unspecified: Secondary | ICD-10-CM

## 2016-05-21 MED ORDER — PANTOPRAZOLE SODIUM 40 MG PO TBEC: 40.0000 mg | DELAYED_RELEASE_TABLET | Freq: Every day | ORAL | 11 refills | Status: DC

## 2016-05-21 NOTE — Progress Notes (Signed)
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 Pulmonary Consult    Referred per Dr. Criss Rosales. Pt c/o cough x 2 months- non prod and esp worse at night and when exposed to strong smells.   indolent onset dry coughing, worse x 2 months with  day > noct  On hyzar since at least 2011 Walks up to 4 blocks when cool s  sob Main problem is smelling cigs/ cleaning solutions.  No previous hx ca, chemo, connective tissue dz, exp to macrodantin or amiodarone to her knowledge rec Pantoprazole (protonix) 40 mg   Take 30-60 min before first meal of the day and Pepcid 20 mg one bedtime until return to office - this is the best way to tell whether stomach acid is contributing to your problem.   GERD diet    12/06/2013 f/u ov/Sargon Scouten re: PF presumably from remote ali related to chemical exp/ cough  Chief Complaint  Patient presents with  . Follow-up    Pt states that her cough has improved some since the last visit. She states that she mainly only coughs at night or if she gets hot.   coughs some still  But mostly p supper, not while sleeping unless gets hot  Walks up to 30 min, some inclines - no sob Not limited by breathing from desired activities rec Change pepcid to where you take it after supper to see if helps your night time cough We  will refill your medications when they run out but don't stop either acid suppressor as they may be helping your cough  Prevnar and flu shots given today     03/28/2014 f/u ov/Churchill Grimsley re: PF from remote ALI on job  Chief Complaint  Patient presents with  . Follow-up    Pt c/o occasional prod cough with clear mucus, no other complaints at this time.   noct cough better on pepcid p supper  rec To get the most out of exercise, you need to be continuously aware that you are short of breath, but never out of breath, for 30 minutes daily. As you improve, it will actually be easier for you to do the same amount of exercise  in  30 minutes so always push to the level where you are short of breath.   Please schedule a follow up visit in 3 months  > did not do    09/21/2015  Consultation Tryton Bodi JK:QASUO/ no longer on any gerd rx / abn CTchest c/w UIP Chief Complaint  Patient presents with  . Pulmonary Consult    Referred by Dr Jeanann Lewandowsky for eval of abnormal ct chest. She c/o increased cough, esp at night. Cough is prod at times with clear sputum. She also c/o occ  DOE "not too often".   Not limited by breathing from desired activities  But by knee pain so very sedentary Cough is worse after supper and hs and doesn't remember whether it flared related to when she stopped the gerd rx as the cough was indolent onset, minimally progressive and minimally productive x months s change with environment, weather or exertion rec Pantoprazole (protonix) 40 mg   Take  30-60 min before first meal of the day and Pepcid (famotidine)  20 mg one @  bedtime until return to office - this is the best way to tell whether stomach acid is contributing to your problem.   GERD diet     01/30/2016 NP Follow up : Post hospital follow up for PNA  Pt presents for an extended office visit . Recently admitted for CAP , ILD . She was treated with IV abx and steroids . CT chest was neg for PE, ILD changes w/ LUL and LLL GGO c/w PNA .  Viral panel was neg   Discharged 2 days ago. She remains on Levaquin and Prednisone taper .  She was started on O2 with walking due to desats.  Since discharge she is starting to feel some better.  Still weak but seems to be getting better. Cough is decreased some.  Has seen blood tingged mucus some but is better as well.  rec Finish Levaquin and Prednisone as directed.  Wear Oxygen 2l/m with walking .  Order for smaller O2 tank.  Follow up Dr. Melvyn Novas  In 3-4 weeks with chest xray and As needed     03/01/2016  extended f/u ov/Havannah Streat re: PF/ now 02 dep sob room to room  Chief Complaint  Patient presents with  . Follow-up    Pt states still having quite a bit of coughing at night. Cough is occ prod with clear sputum. She has had nose bleeds.  She states her breathing has been worse since the last visit and she sometimes gets winded just walking from room to room at home.    doe x room to room even on 02 Taking ppi after eat  Transient improvement p steroids/ worse off them  rec Prednisone 10 mg take x 2  in am with breakfast until better then  1 daily until return  Pantoprazole (protonix) 40 mg   Take  30-60 min before first meal of the day and Pepcid (famotidine)  20 mg one @  bedtime until return to office - this is the best way to tell whether stomach acid is contributing to your problem.   GERD diet    05/21/2016  f/u ov/Essex Perry re: PF/ steroid responsive / no longer on gerd rx / still on pred 10 Chief Complaint  Patient presents with  . Follow-up    pulmonary fibrosis, pt reports she is doing well, seems like she coughs more at night  MMRC3 = can't walk 100 yards even at a slow pace at a flat grade s stopping due to sob  Even on 2lpm but does not titrate  Worse sob since reduced pred to 10 mg daily   No obvious day to day or daytime variability or assoc excess/ purulent sputum or mucus plugs or hemoptysis or cp or chest tightness, subjective wheeze or overt sinus or hb symptoms. No  unusual exp hx or h/o childhood pna/ asthma or knowledge of premature birth.  Sleeping ok without nocturnal  or early am exacerbation  of respiratory  c/o's or need for noct saba.  Also denies any obvious fluctuation of symptoms with weather or environmental changes or other aggravating or alleviating factors except as outlined above   Current Medications, Allergies, Complete Past Medical History, Past Surgical History, Family History, and Social History were reviewed in [facility] record.  ROS  The following are not active complaints unless bolded sore throat, dysphagia, dental problems, itching, sneezing,  nasal congestion or excess/ purulent secretions, ear ache,   fever, chills, sweats, unintended wt loss, classically pleuritic or exertional cp,  orthopnea pnd or leg swelling, presyncope, palpitations, abdominal pain, anorexia, nausea, vomiting, diarrhea  or change in bowel or bladder habits, change in stools or urine, dysuria,hematuria,  rash, arthralgias, visual complaints, headache, numbness, weakness or ataxia or problems with walking or coordination,  change in mood/affect or memory.                    Objective:   Physical Exam  amb hoarse bf nad   05/21/2016       152  03/01/16 150 lb (68 kg)  02/08/16 147 lb 4.3 oz (66.8 kg)  01/30/16 142 lb 9.6 oz (64.7 kg)    Vital signs reviewed   - Note on arrival 02 sats  99% on 4lpm        HEENT: upper dentures/ nl  turbinates, and oropharynx. Nl external ear canals without cough reflex   NECK :  without JVD/Nodes/TM/ nl carotid upstrokes bilaterally   LUNGS: no acc muscle use,     insp crackles bases bilaterally s cough on insp    CV:  RRR  no s3 or murmur or increase in P2, no edema   ABD:  soft and nontender with nl inspiratory excursion in the supine position. 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   Labs   reviewed:     Chemistry      Component Value Date/Time   NA 137 02/09/2016 0357   K 4.1 02/09/2016 0357   CL 102 02/09/2016 0357   CO2 27 02/09/2016 0357   BUN 22 (H) 02/09/2016 0357   CREATININE 0.95 02/09/2016 0357      Component Value Date/Time   CALCIUM 8.1 (L) 02/09/2016 0357   ALKPHOS 42 02/06/2016 0326   AST 21 02/06/2016 0326   ALT 10 (L) 02/06/2016 0326   BILITOT 0.3 02/06/2016 0326        Lab Results  Component Value Date   WBC 17.0 (H) 03/01/2016   HGB 13.2 03/01/2016   HCT 40.3 03/01/2016   MCV 82.5 03/01/2016   PLT 383.0 03/01/2016         Lab Results  Component Value Date   TSH 0.779 02/06/2016     Lab Results  Component Value Date   PROBNP 31.0 03/01/2016           Lab Results  Component Value Date   ESRSEDRATE 81 (H) 04/09/2016   ESRSEDRATE 84 (H) 03/01/2016   ESRSEDRATE 35 (H) 02/06/2016

## 2016-05-21 NOTE — Patient Instructions (Addendum)
Change prednisone to 20 mg per day until better then 10 mg daily alternating with 20 mg but if worse then resume the 20 mg daily   Add pantoprazole 40 mg Take 30-60 min before first meal of the day to your present medications  Change 02 to 2lpm at rest and 6lpm walking  For now  See Tammy NP win 4  weeks with all your medications, even over the counter meds, separated in two separate bags, the ones you take no matter what vs the ones you stop once you feel better and take only as needed when you feel you need them.   Tammy  will generate for you a new user friendly medication calendar that will put us all on the same page re: your medication use.

## 2016-05-24 NOTE — Assessment & Plan Note (Signed)
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 > seen on plain cxr 2006  - spirometry 11/18/ 2011  VC  1.74 (60%) - PFT's attempted 11/16/13 could not perform See CXR  slt worse 10/12/13 vs 2006 but no change on 08/23/15 - 10/22/2013  Walked RA x 3 laps @ 185 ft each stopped due to  End of study, moderate to fast sat 90% at end > rx just gerd rx - 12/06/2013  Walked RA x 3 laps @ 185 ft each stopped due to  End of study, fast pace, sats 91% at end - 03/28/2014  Walked RA x 2laps @ 185 ft each stopped due to  Back pain, moderate pace, sats 89% no sob  - CT chest 09/05/15 c/w UIP > rec max gerd rx/ f/u 6 mwalk - ESR 84 03/01/2016 started on daily pred rx    Clearly better on higher doses of prednisone so rec restart 20 mg and taper to 20/10 if/ when improves  Also, Use of PPI is associated with improved survival time and with decreased radiologic fibrosis per King's study published in AJRCCM vol 184 p1390.  Dec 2011 and also may have other beneficial effects as per the latest review in [place] vol 193 W0981 Jun 20016.  This may not always be cause and effect, but given how universally unimpressive and expensive  all the other  Drugs developed to day  have been for pf,   rec restart  rx ppi / diet/ lifestyle modification and f/u with serial walking sats and lung volumes for now to put more points on the curve / establish firm baseline before considering additional measures.   Concerned re med reconciliation.  To keep things simple, I have asked the patient to first separate medicines that are perceived as maintenance, that is to be taken daily "no matter what", from those medicines that are taken on only on an as-needed basis and I have given the patient examples of both, and then return to see our NP to generate a  detailed  medication calendar which should be followed until the next physician sees the patient and updates it.     I had an extended discussion with the patient reviewing  all relevant studies completed to date and  lasting 15 to 20 minutes of a 25 minute visit    Each maintenance medication was reviewed in detail including most importantly the difference between maintenance and prns and under what circumstances the prns are to be triggered using an action plan format that is not reflected in the computer generated alphabetically organized AVS.    Please see AVS for specific instructions unique to this visit that I personally wrote and verbalized to the the pt in detail and then reviewed with pt  by my nurse highlighting any  changes in therapy recommended at today's visit to their plan of care.

## 2016-06-18 ENCOUNTER — Other Ambulatory Visit (INDEPENDENT_AMBULATORY_CARE_PROVIDER_SITE_OTHER): Payer: Medicare Other

## 2016-06-18 ENCOUNTER — Telehealth: Payer: Self-pay | Admitting: Adult Health

## 2016-06-18 ENCOUNTER — Encounter: Payer: Self-pay | Admitting: Adult Health

## 2016-06-18 ENCOUNTER — Ambulatory Visit (INDEPENDENT_AMBULATORY_CARE_PROVIDER_SITE_OTHER): Payer: Medicare Other | Admitting: Adult Health

## 2016-06-18 VITALS — BP 130/72 | HR 74 | Ht 69.0 in | Wt 168.6 lb

## 2016-06-18 DIAGNOSIS — J8410 Pulmonary fibrosis, unspecified: Secondary | ICD-10-CM

## 2016-06-18 DIAGNOSIS — J9611 Chronic respiratory failure with hypoxia: Secondary | ICD-10-CM

## 2016-06-18 LAB — SEDIMENTATION RATE: Sed Rate: 78 mm/hr — ABNORMAL HIGH (ref 0–30)

## 2016-06-18 NOTE — Progress Notes (Signed)
@Patient  ID: Kim [name], female    DOB: 03-Apr-1940, 76 y.o.   MRN: [id number]  Chief Complaint  Patient presents with  . 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 >seen on plain cxr 2006 - spirometry 11/18/ 2011 VC 1.74 (60%) - PFT's attempted 11/16/13 could not perform See CXR slt worse 10/12/13 vs 2006 but no change on 08/23/15 - 8/28/2015Walked RA x 3 laps @ 185 ft each stopped due to End of study, moderate to fast sat 90% at end >rx just gerd rx - 10/12/2015Walked RA x 3 laps @ 185 ft each stopped due to End of study, fast pace, sats 91% at end - 2/1/2016Walked RA x 2laps @ 185 ft each stopped due to Back pain, moderate pace, sats 89% no sob  - CT chest 09/05/15 c/w UIP > rec max gerd rx/ f/u 6 mwalk - ESR 84 1/5/2018started on daily pred rx   06/18/2016 Follow up ;  ILD  Pt returns for 1 month follow up and med review  Seen last ov with flare of dyspnea /cough . Prednisone was increased to 47m daily. She is feeling some better. Back down to prednisone 128mdaily .  Denies fever or chest pain   We reviewed all her meds and organized them into a med calendar with pt education. Appears to be taking correctly .    Allergies  Allergen Reactions  . Codeine Nausea And Vomiting    "Takes my appetite"  . Penicillins Rash    Has patient had a PCN reaction causing immediate rash, facial/tongue/throat swelling, SOB or lightheadedness with hypotension: Yes Has patient had a PCN reaction causing severe rash involving mucus membranes or skin necrosis: No Has patient had a PCN reaction that required hospitalization No Has patient had a PCN reaction occurring within the last 10 years: No If all of the above answers are "NO", then may proceed with Cephalosporin use.     Immunization History    Administered Date(s) Administered  . Influenza, High Dose Seasonal PF 04/09/2016  . Influenza,inj,Quad PF,36+ Mos 12/06/2013  . Pneumococcal Conjugate-13 12/06/2013    Past Medical History:  Diagnosis Date  . Acute bronchiolitis due to respiratory syncytial virus (RSV) 02/08/2016  . ANA positive 02/09/2016  . Constipation due to pain medication   . Diabetes mellitus without complication ([facility]  . GERD (gastroesophageal reflux disease)   . Hyperlipemia   . Hypertension   . 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 Postinflammatory pulmonary fibrosis ([facility]  . Seasonal allergies     Tobacco History: History  Smoking Status  . Former Smoker  . Packs/day: 0.50  . Years: 10.00  . Types: Cigarettes  . Quit date: 02/26/1988  Smokeless Tobacco  . Never Used   Counseling given: Not Answered   Outpatient Encounter Prescriptions as of 06/18/2016  Medication Sig  . acetaminophen (TYLENOL) 500 MG tablet Use per bottle as needed.  . Marland Kitchenspirin EC 81 MG tablet Take 81 mg by mouth daily.  . famotidine (PEPCID) 20 MG tablet TAKE 1 TABLET (20 MG TOTAL) BY MOUTH AT BEDTIME.  . Marland Kitchenevothyroxine (SYNTHROID, LEVOTHROID) 50 MCG tablet Take 50 mcg by mouth daily before breakfast.  . losartan-hydrochlorothiazide (HYZAAR) 100-12.5 MG per tablet Take 1 tablet by mouth daily.  . metFORMIN (GLUCOPHAGE) 500 MG tablet TAKE  1 TABLET BY MOUTH TWICE A DAY  . OXYGEN 2 lpm 24/7  [facility]  . pantoprazole (PROTONIX) 40 MG tablet Take 1 tablet (40 mg total) by mouth daily. Take 30-60 min before first meal of the day  . predniSONE (DELTASONE) 10 MG tablet Take 1 tablet (10 mg total) by mouth daily with breakfast.  . sodium chloride (OCEAN) 0.65 % SOLN nasal spray Place 1 spray into both nostrils as needed for congestion.   No facility-administered encounter medications on file as of 06/18/2016.      Review of Systems  Constitutional:   No  weight loss, night sweats,  Fevers, chills, + fatigue, or  lassitude.  HEENT:    No headaches,  Difficulty swallowing,  Tooth/dental problems, or  Sore throat,                No sneezing, itching, ear ache, nasal congestion, post nasal drip,   CV:  No chest pain,  Orthopnea, PND, swelling in lower extremities, anasarca, dizziness, palpitations, syncope.   GI  No heartburn, indigestion, abdominal pain, nausea, vomiting, diarrhea, change in bowel habits, loss of appetite, bloody stools.   Resp: No chest wall deformity  Skin: no rash or lesions.  GU: no dysuria, change in color of urine, no urgency or frequency.  No flank pain, no hematuria   MS:  No joint pain or swelling.  No decreased range of motion.  No back pain.    Physical Exam  BP 130/72 (BP Location: Left Arm, Cuff Size: Normal)   Pulse 74   Ht 5' 9"  (1.753 m)   Wt 168 lb 9.6 oz (76.5 kg)   SpO2 96%   BMI 24.90 kg/m   GEN: A/Ox3; pleasant , NAD, elderly    HEENT:  [place]/AT,  EACs-clear, TMs-wnl, NOSE-clear, THROAT-clear, no lesions, no postnasal drip or exudate noted.   NECK:  Supple w/ fair ROM; no JVD; normal carotid impulses w/o bruits; no thyromegaly or nodules palpated; no lymphadenopathy.    RESP  Decreased BS in bases . no accessory muscle use, no dullness to percussion  CARD:  RRR, no m/r/g, no peripheral edema, pulses intact, no cyanosis or clubbing.  GI:   Soft & nt; nml bowel sounds; no organomegaly or masses detected.   Musco: Warm bil, no deformities or joint swelling noted.   Neuro: alert, no focal deficits noted.    Skin: Warm, no lesions or rashes    Lab Results:  CBC  BNP  Imaging: No results found.   Assessment & Plan:   No problem-specific Assessment & Plan notes found for this encounter.     Rexene Edison, NP 06/18/2016

## 2016-06-18 NOTE — Telephone Encounter (Signed)
Pt seen 3.27.18 by MW and O2 liter flow was increased to 2lpm at rest and 6lpm with exertion Order not placed at ov Order placed in Epic Staff message sent to [place] to let him know this has been done  Nothing further needed; will sign off

## 2016-06-18 NOTE — Progress Notes (Signed)
lmtcb

## 2016-06-18 NOTE — Patient Instructions (Signed)
Increase Prednisone  daily .  Labs today .  Continue on Oxygen 2l/m at rest , and 6l/m walking .  follow up Dr. Sherene Sires  In 2 months and As needed   Follow med calendar closely and bring to each visit.

## 2016-06-24 ENCOUNTER — Telehealth: Payer: Self-pay | Admitting: Internal Medicine

## 2016-06-24 NOTE — Telephone Encounter (Signed)
Called and spoke with Dr. Tedra Sen[place] nurse and they are aware of why the pt is taking the protonix and pepcid.  Nothing further is needed.

## 2016-06-27 NOTE — Assessment & Plan Note (Signed)
Cont on o2 . , keep sats >90% .

## 2016-06-27 NOTE — Assessment & Plan Note (Signed)
Recent flare now improving  ESR remains high -will keep prednisone at 33m daily  Patient's medications were reviewed today and patient education was given. Computerized medication calendar was adjusted/completed   Plan  Patient Instructions  Increase Prednisone 175mdaily .  Labs today .  Continue on Oxygen 2l/m at rest , and 6l/m walking .  follow up Dr. WeMelvyn NovasIn 2 months and As needed   Follow med calendar closely and bring to each visit.

## 2016-07-04 ENCOUNTER — Other Ambulatory Visit: Payer: Self-pay | Admitting: Internal Medicine

## 2016-08-07 ENCOUNTER — Other Ambulatory Visit: Payer: Self-pay | Admitting: Internal Medicine

## 2016-08-12 ENCOUNTER — Other Ambulatory Visit: Payer: Self-pay | Admitting: Internal Medicine

## 2016-08-19 ENCOUNTER — Other Ambulatory Visit (INDEPENDENT_AMBULATORY_CARE_PROVIDER_SITE_OTHER): Payer: Medicare Other

## 2016-08-19 ENCOUNTER — Encounter: Payer: Self-pay | Admitting: Internal Medicine

## 2016-08-19 ENCOUNTER — Ambulatory Visit (INDEPENDENT_AMBULATORY_CARE_PROVIDER_SITE_OTHER): Payer: Medicare Other | Admitting: Internal Medicine

## 2016-08-19 ENCOUNTER — Ambulatory Visit (INDEPENDENT_AMBULATORY_CARE_PROVIDER_SITE_OTHER)
Admission: RE | Admit: 2016-08-19 | Discharge: 2016-08-19 | Disposition: A | Discharge status: Home / Self Care | Payer: Medicare Other | Source: Ambulatory Visit | Attending: Internal Medicine | Admitting: Internal Medicine

## 2016-08-19 VITALS — BP 140/72 | HR 93 | Ht 69.0 in | Wt 176.8 lb

## 2016-08-19 DIAGNOSIS — J9611 Chronic respiratory failure with hypoxia: Secondary | ICD-10-CM | POA: Diagnosis not present

## 2016-08-19 DIAGNOSIS — J8410 Pulmonary fibrosis, unspecified: Secondary | ICD-10-CM | POA: Diagnosis not present

## 2016-08-19 LAB — SEDIMENTATION RATE: Sed Rate: 66 mm/hr — ABNORMAL HIGH (ref 0–30)

## 2016-08-19 NOTE — Progress Notes (Signed)
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 Pulmonary Consult    Referred per Dr. Criss Rosales. Pt c/o cough x 2 months- non prod and esp worse at night and when exposed to strong smells.   indolent onset dry coughing, worse x 2 months with  day > noct  On hyzar since at least 2011 Walks up to 4 blocks when cool s  sob Main problem is smelling cigs/ cleaning solutions.  No previous hx ca, chemo, connective tissue dz, exp to macrodantin or amiodarone to her knowledge rec Pantoprazole (protonix) 40 mg   Take 30-60 min before first meal of the day and Pepcid 20 mg one bedtime until return to office - this is the best way to tell whether stomach acid is contributing to your problem.   GERD diet    12/06/2013 f/u ov/Kristyne Woodring re: PF presumably from remote ali related to chemical exp/ cough  Chief Complaint  Patient presents with  . Follow-up    Pt states that her cough has improved some since the last visit. She states that she mainly only coughs at night or if she gets hot.   coughs some still  But mostly p supper, not while sleeping unless gets hot  Walks up to 30 min, some inclines - no sob Not limited by breathing from desired activities rec Change pepcid to where you take it after supper to see if helps your night time cough We  will refill your medications when they run out but don't stop either acid suppressor as they may be helping your cough  Prevnar and flu shots given today     03/28/2014 f/u ov/Ipek Westra re: PF from remote ALI on job  Chief Complaint  Patient presents with  . Follow-up    Pt c/o occasional prod cough with clear mucus, no other complaints at this time.   noct cough better on pepcid p supper  rec To get the most out of exercise, you need to be continuously aware that you are short of breath, but never out of breath, for 30 minutes daily. As you improve, it will actually be easier for you to do the same amount of exercise  in  30 minutes so always push to the level where you are short of breath.   Please schedule a follow up visit in 3 months  > did not do    09/21/2015  Consultation Rosie Golson OI:ZTIWP/ no longer on any gerd rx / abn CTchest c/w UIP Chief Complaint  Patient presents with  . Pulmonary Consult    Referred by Dr Jeanann Lewandowsky for eval of abnormal ct chest. She c/o increased cough, esp at night. Cough is prod at times with clear sputum. She also c/o occ DOE "  not too often".   Not limited by breathing from desired activities  But by knee pain so very sedentary Cough is worse after supper and hs and doesn't remember whether it flared related to when she stopped the gerd rx as the cough was indolent onset, minimally progressive and minimally productive x months s change with environment, weather or exertion rec Pantoprazole (protonix) 40 mg   Take  30-60 min before first meal of the day and Pepcid (famotidine)  20 mg one @  bedtime until return to office - this is the best way to tell whether stomach acid is contributing to your problem.   GERD diet     01/30/2016 NP Follow up : Post hospital follow up for PNA  Pt presents for an extended office visit . Recently admitted for CAP , ILD . She was treated with IV abx and steroids . CT chest was neg for PE, ILD changes w/ LUL and LLL GGO c/w PNA .  Viral panel was neg   Discharged 2 days ago. She remains on Levaquin and Prednisone taper .  She was started on O2 with walking due to desats.  Since discharge she is starting to feel some better.  Still weak but seems to be getting better. Cough is decreased some.  Has seen blood tingged mucus some but is better as well.  rec Finish Levaquin and Prednisone as directed.  Wear Oxygen 2l/m with walking .  Order for smaller O2 tank.  Follow up Dr. Melvyn Novas  In 3-4 weeks with chest xray and As needed     03/01/2016  extended f/u ov/Dawnetta Copenhaver re: PF/ now 02 dep sob room to room  Chief Complaint  Patient presents with  . Follow-up    Pt states still having quite a bit of coughing at night. Cough is occ prod with clear sputum. She has had nose bleeds.  She states her breathing has been worse since the last visit and she sometimes gets winded just walking from room to room at home.    doe x room to room even on 02 Taking ppi after eat  Transient improvement p steroids/ worse off them  rec Prednisone 10 mg take x 2  in am with breakfast until better then  1 daily until return  Pantoprazole (protonix) 40 mg   Take  30-60 min before first meal of the day and Pepcid (famotidine)  20 mg one @  bedtime until return to office - this is the best way to tell whether stomach acid is contributing to your problem.   GERD diet    05/21/2016  f/u ov/Triston Lisanti re: PF/ steroid responsive / no longer on gerd rx / still on pred 10 Chief Complaint  Patient presents with  . Follow-up    pulmonary fibrosis, pt reports she is doing well, seems like she coughs more at night  MMRC3 = can't walk 100 yards even at a slow pace at a flat grade s stopping due to sob  Even on 2lpm but does not titrate  Worse sob since reduced pred to 10 mg daily  rec Change prednisone to 20 mg per day until better then 10 mg daily alternating with 20 mg but if worse then resume the 20 mg daily  Add pantoprazole 40 mg Take 30-60 min before first meal  of the day to your present medications Change 02 to 2lpm at rest and 6lpm walking  For now See Tammy NP win 4  weeks with all your  medications     4/243/18 NP rec Increase Prednisone 85m daily .  Labs today .  Continue on Oxygen 2l/m at rest , and 6l/m walking .  follow up Dr. WMelvyn Novas In 2 months and As needed      08/19/2016  f/u ov/Barbarita Hutmacher re:  PF with high ESR on prednisone 15 mg daily  Chief Complaint  Patient presents with  . Follow-up    Breathing has been worse for the past few days. She is still coughing alot at night, non prod.   very poor insight into meds/ use of 02 but apparently  walks "2 doors down and back on 6lpm ok   No obvious day to day or daytime variability or assoc excess/ purulent sputum or mucus plugs or hemoptysis or cp or chest tightness, subjective wheeze or overt sinus or hb symptoms. No unusual exp hx or h/o childhood pna/ asthma or knowledge of premature birth.  Sleeping ok  But some noct cough s early am exacerbation or need for noct saba. Also denies any obvious fluctuation of symptoms with weather or environmental changes or other aggravating or alleviating factors except as outlined above   Current Medications, Allergies, Complete Past Medical History, Past Surgical History, Family History, and Social History were reviewed in [employer].  ROS  The following are not active complaints unless bolded sore throat, dysphagia, dental problems, itching, sneezing,  nasal congestion or excess/ purulent secretions, ear ache,   fever, chills, sweats, unintended wt loss, classically pleuritic or exertional cp,  orthopnea pnd or leg swelling, presyncope, palpitations, abdominal pain, anorexia, nausea, vomiting, diarrhea  or change in bowel or bladder habits, change in stools or urine, dysuria,hematuria,  rash, arthralgias, visual complaints, headache, numbness, weakness or ataxia or problems with walking or coordination,  change in  mood/affect or memory.                Objective:   Physical Exam  amb hoarse bf nad   08/19/2016        175   05/21/2016       152  03/01/16 150 lb (68 kg)  02/08/16 147 lb 4.3 oz (66.8 kg)  01/30/16 142 lb 9.6 oz (64.7 kg)    Vital signs reviewed   - Note on arrival 02 sats  90% on 2lpm        HEENT: upper dentures/ nl  turbinates, and oropharynx. Nl external ear canals without cough reflex   NECK :  without JVD/Nodes/TM/ nl carotid upstrokes bilaterally   LUNGS: no acc muscle use,     insp crackles bases bilaterally s cough on insp    CV:  RRR  no s3 or murmur or increase in P2, no edema   ABD:  soft and nontender with nl inspiratory excursion in the supine position. No bruits or organomegaly, bowel sounds nl  MS:  Nl gait/ ext warm without deformities, calf tenderness, cyanosis or clubbing No obvious joint restrictions   SKIN: warm and dry without lesions    NEURO:  alert, approp, nl sensorium with  no motor deficits   CXR PA and Lateral:   08/19/2016 :    I personally reviewed images and agree with radiology impression as follows:   Chronic fibrotic changes. There is no acute cardiopulmonary Abnormality.     Lab Results  Component Value Date   ESRSEDRATE 66 (H) 08/19/2016   ESRSEDRATE 78 (H) 06/18/2016   ESRSEDRATE 81 (H) 04/09/2016

## 2016-08-19 NOTE — Progress Notes (Signed)
Spoke with pt and notified of results per Dr. Wert. Pt verbalized understanding and denied any questions. 

## 2016-08-19 NOTE — Patient Instructions (Addendum)
Please remember to go to the lab and x-ray department downstairs in the basement  for your tests - we will call you with the results when they are available.            Please schedule a follow up office visit in 6 weeks, call sooner if needed  

## 2016-08-20 NOTE — Assessment & Plan Note (Signed)
05/21/2016   Walked 4lpm  2 laps @ 185 ft each stopped due to  Sob and desats on lap 2 corrected on 6lpm  - 08/19/2016   Walked 6lpm 2 laps @ 185 ft each stopped due to  desat to 87%  @ slow pace but  Min sob  As of 08/19/2016 rec 2lpm rest, sleeping, and 4lpm walking w/in house, 6lpm outside

## 2016-08-20 NOTE — Assessment & Plan Note (Signed)
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 > seen on plain cxr 2006  - spirometry 11/18/ 2011  VC  1.74 (60%) - PFT's attempted 11/16/13 could not perform See CXR  slt worse 10/12/13 vs 2006 but no change on 08/23/15 - 10/22/2013  Walked RA x 3 laps @ 185 ft each stopped due to  End of study, moderate to fast sat 90% at end > rx just gerd rx - 12/06/2013  Walked RA x 3 laps @ 185 ft each stopped due to  End of study, fast pace, sats 91% at end - 03/28/2014  Walked RA x 2laps @ 185 ft each stopped due to  Back pain, moderate pace, sats 89% no sob  - CT chest 09/05/15 c/w UIP > rec max gerd rx/ f/u 6 mwalk - ESR 84 03/01/2016 started on daily pred rx  - ESR 66 08/20/2016 on Prednisone 15 mg daily > increase to 20 mg daily until better then try 10  The goal with a chronic steroid dependent illness is always arriving at the lowest effective dose that controls the disease/symptoms and not accepting a set "formula" which is based on statistics or guidelines that don't always take into account patient  variability or the natural hx of the dz in every individual patient, which may well vary over time.  For now therefore I recommend the patient maintain  20 mg ceiling and 10 mg floor if possible until next ov    I had an extended discussion with the patient reviewing all relevant studies completed to date and  lasting 15 to 20 minutes of a 25 minute visit    Each maintenance medication was reviewed in detail including most importantly the difference between maintenance and prns and under what circumstances the prns are to be triggered using an action plan format that is not reflected in the computer generated alphabetically organized AVS.    Please see AVS for specific instructions unique to this visit that I personally wrote and verbalized to the the pt in detail and then reviewed with pt  by my nurse highlighting any  changes in therapy recommended at today's visit to their plan of care.

## 2016-08-22 ENCOUNTER — Other Ambulatory Visit: Payer: Self-pay | Admitting: Internal Medicine

## 2016-08-22 MED ORDER — PANTOPRAZOLE SODIUM 40 MG PO TBEC
40.0000 mg | DELAYED_RELEASE_TABLET | Freq: Every day | ORAL | 3 refills | Status: AC
Start: 2016-08-22 — End: ?

## 2016-09-30 ENCOUNTER — Encounter (HOSPITAL_COMMUNITY): Payer: Self-pay | Admitting: Emergency Medicine

## 2016-09-30 ENCOUNTER — Ambulatory Visit: Payer: Medicare Other | Admitting: Adult Health

## 2016-09-30 ENCOUNTER — Emergency Department (HOSPITAL_COMMUNITY): Payer: Medicare Other

## 2016-09-30 ENCOUNTER — Emergency Department (HOSPITAL_COMMUNITY)
Admission: EM | Admit: 2016-09-30 | Discharge: 2016-09-30 | Disposition: A | Discharge status: Home / Self Care | Payer: Medicare Other | Attending: Physician Assistant | Admitting: Physician Assistant

## 2016-09-30 DIAGNOSIS — E039 Hypothyroidism, unspecified: Secondary | ICD-10-CM | POA: Insufficient documentation

## 2016-09-30 DIAGNOSIS — R0602 Shortness of breath: Secondary | ICD-10-CM

## 2016-09-30 DIAGNOSIS — E119 Type 2 diabetes mellitus without complications: Secondary | ICD-10-CM | POA: Insufficient documentation

## 2016-09-30 DIAGNOSIS — I10 Essential (primary) hypertension: Secondary | ICD-10-CM | POA: Insufficient documentation

## 2016-09-30 DIAGNOSIS — Z96651 Presence of right artificial knee joint: Secondary | ICD-10-CM | POA: Diagnosis not present

## 2016-09-30 LAB — COMPREHENSIVE METABOLIC PANEL
ALBUMIN: 3.4 g/dL — AB (ref 3.5–5.0)
ALT: 6 U/L — ABNORMAL LOW (ref 14–54)
ANION GAP: 11 (ref 5–15)
AST: 15 U/L (ref 15–41)
Alkaline Phosphatase: 65 U/L (ref 38–126)
BILIRUBIN TOTAL: 0.3 mg/dL (ref 0.3–1.2)
BUN: 14 mg/dL (ref 6–20)
CO2: 26 mmol/L (ref 22–32)
Calcium: 9.3 mg/dL (ref 8.9–10.3)
Chloride: 101 mmol/L (ref 101–111)
Creatinine, Ser: 1.08 mg/dL — ABNORMAL HIGH (ref 0.44–1.00)
GFR calc non Af Amer: 49 mL/min — ABNORMAL LOW (ref >60)
GFR, EST AFRICAN AMERICAN: 57 mL/min — AB (ref >60)
GLUCOSE: 130 mg/dL — AB (ref 65–99)
POTASSIUM: 4.4 mmol/L (ref 3.5–5.1)
Sodium: 138 mmol/L (ref 135–145)
TOTAL PROTEIN: 7.3 g/dL (ref 6.5–8.1)

## 2016-09-30 LAB — CBC
HEMATOCRIT: 37.4 % (ref 36.0–46.0)
HEMOGLOBIN: 11.4 g/dL — AB (ref 12.0–15.0)
MCH: 23.6 pg — ABNORMAL LOW (ref 26.0–34.0)
MCHC: 30.5 g/dL (ref 30.0–36.0)
MCV: 77.3 fL — ABNORMAL LOW (ref 78.0–100.0)
Platelets: 421 10*3/uL — ABNORMAL HIGH (ref 150–400)
RBC: 4.84 MIL/uL (ref 3.87–5.11)
RDW: 15.6 % — AB (ref 11.5–15.5)
WBC: 16.2 10*3/uL — AB (ref 4.0–10.5)

## 2016-09-30 LAB — I-STAT CG4 LACTIC ACID, ED
LACTIC ACID, VENOUS: 2.27 mmol/L — AB (ref 0.5–1.9)
LACTIC ACID, VENOUS: 1.2 mmol/L (ref 0.5–1.9)

## 2016-09-30 LAB — I-STAT TROPONIN, ED: Troponin i, poc: 0.01 ng/mL (ref 0.00–0.08)

## 2016-09-30 MED ORDER — PREDNISONE 20 MG PO TABS: ORAL_TABLET | ORAL | 0 refills | Status: DC

## 2016-09-30 MED ORDER — AZITHROMYCIN 250 MG PO TABS: ORAL_TABLET | ORAL | 0 refills | Status: DC

## 2016-09-30 MED ORDER — ONDANSETRON 4 MG PO TBDP
4.0000 mg | ORAL_TABLET | Freq: Once | ORAL | Status: AC
  Administered 2016-09-30: 4 mg via ORAL
  Filled 2016-09-30: qty 1

## 2016-09-30 MED ORDER — PREDNISONE 20 MG PO TABS
40.0000 mg | ORAL_TABLET | Freq: Once | ORAL | Status: AC
  Administered 2016-09-30: 40 mg via ORAL
  Filled 2016-09-30: qty 2

## 2016-09-30 MED ORDER — SODIUM CHLORIDE 0.9 % IV BOLUS (SEPSIS)
500.0000 mL | Freq: Once | INTRAVENOUS | Status: AC
  Administered 2016-09-30: 500 mL via INTRAVENOUS

## 2016-09-30 MED ORDER — METHYLPREDNISOLONE SODIUM SUCC 125 MG IJ SOLR
125.0000 mg | Freq: Once | INTRAMUSCULAR | Status: AC
  Administered 2016-09-30: 125 mg via INTRAVENOUS
  Filled 2016-09-30: qty 2

## 2016-09-30 MED ORDER — AZITHROMYCIN 250 MG PO TABS
500.0000 mg | ORAL_TABLET | Freq: Once | ORAL | Status: AC
  Administered 2016-09-30: 500 mg via ORAL
  Filled 2016-09-30: qty 2

## 2016-09-30 NOTE — ED Notes (Signed)
Ambulated patient in the hallway with 6L of 02, patient stayed above 95%. Tolerated well.

## 2016-09-30 NOTE — ED Triage Notes (Signed)
Pt c/o shortness of breath that began yesterday. Pt wears 2L [place] normally, SpO2 88% on 2L. Breathing labored, speaking in short phrases.

## 2016-09-30 NOTE — Discharge Instructions (Signed)
You were seen today with shortness of breath. We are glad you're back to baseline. We think that you need to take steroids at home to help control your symptoms. Please follow-up with your pulmonologist the morning.

## 2016-09-30 NOTE — ED Notes (Signed)
Patient requested to be placed on bedpan, attempting to provide urine specimen

## 2016-09-30 NOTE — ED Provider Notes (Signed)
[facility]ENC[facility] DEPT Provider Note   CSN: [id number] Arrival date & time: 09/30/16  1722     History   Chief Complaint Chief Complaint  Patient presents with  . Shortness of Breath    HPI Kim [name] is a 76 y.o. female.  HPI   Patient is a 76 year old female with poor lung function. Patient is seen by Dr. Sherene Sires, of pulmonology. She has interstitial lung disease and is on 10 mg of prednisone at baseline. She reports last 3-4 days she's had an increased cough, sputum production and increased oxygen use. Patient at baseline uses 2 liters and 6 L while walking. Patient arrived to the emergency department tachypnic, mildly hypoxic on 2 L.  Past Medical History:  Diagnosis Date  . Acute bronchiolitis due to respiratory syncytial virus (RSV) 02/08/2016  . ANA positive 02/09/2016  . Constipation due to pain medication   . Diabetes mellitus without complication (HCC)   . GERD (gastroesophageal reflux disease)   . Hyperlipemia   . Hypertension   . 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 Postinflammatory pulmonary fibrosis (HCC)   . Seasonal allergies     Patient Active Problem List   Diagnosis Date Noted  . ANA positive 02/09/2016  . Acute bronchiolitis due to respiratory syncytial virus (RSV) 02/08/2016  . Chronic respiratory failure with hypoxia (HCC) 02/06/2016  . HCAP (healthcare-associated pneumonia) 02/06/2016  . 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 Acute respiratory failure with hypoxia (HCC) 01/25/2016  . Hypothyroidism 01/25/2016  . 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 Pure hypercholesterolemia 07/29/2012  . Diabetes mellitus type 2 in obese (HCC) 01/16/2010  . HTN (hypertension) 01/16/2010  . Upper airway cough syndrome 01/12/2010    Past Surgical History:  Procedure Laterality Date  . ABDOMINAL HYSTERECTOMY    . COLONOSCOPY    . HERNIA REPAIR     X 2  . SMALL INTESTINE SURGERY      . 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 Authorizing Provider  acetaminophen (TYLENOL) 500 MG tablet Use per bottle as needed.    [provider]  aspirin EC 81 MG tablet Take 81 mg by mouth daily.    [provider]  benzonatate (TESSALON) 200 MG capsule Take 200 mg by mouth every 8 (eight) hours as needed for cough.    [provider]  famotidine (PEPCID) 20 MG tablet TAKE 1 TABLET (20 MG TOTAL) BY MOUTH AT BEDTIME. 07/04/16   Nyoka Cowden, MD  Levothyroxine Sodium 75 MCG CAPS Take 75 mcg by mouth daily before breakfast.     [provider]  losartan-hydrochlorothiazide (HYZAAR) 100-12.5 MG per tablet Take 1 tablet by mouth daily.    [provider]  metFORMIN (GLUCOPHAGE) 500 MG tablet TAKE 1 TABLET BY MOUTH TWICE A DAY 03/31/13   Worthy Rancher B, FNP  OXYGEN 2 lpm at rest, 6 lpm with activity  [facility]    [provider]  pantoprazole (PROTONIX) 40 MG tablet Take 1 tablet (40 mg total) by mouth daily. Take 30-60 min before first meal of the day 08/22/16   Nyoka Cowden, MD  predniSONE (DELTASONE) 10  MG tablet Take 1 and 1/2 tablets by mouth once daily    [provider]  sodium chloride (OCEAN) 0.65 % SOLN nasal spray Use as needed    [provider]    Family History Family History  Problem Relation Age of Onset  . Stomach cancer Sister   . Heart attack Sister     Social History Social History  Substance Use Topics  . Smoking status: Former Smoker    Packs/day: 0.50    Years: 10.00    Types: Cigarettes    Quit date: 02/26/1988  . Smokeless tobacco: Never Used  . Alcohol use No     Allergies   Codeine and Penicillins   Review  of Systems Review of Systems  Constitutional: Positive for fatigue. Negative for fever.  Respiratory: Positive for cough and shortness of breath.   All other systems reviewed and are negative.    Physical Exam Updated Vital Signs BP (!) 178/81 (BP Location: Left Arm)   Pulse 85   Temp 98.1 F (36.7 C) (Oral)   Resp (!) 31   Ht 5\' 9"  (1.753 m)   Wt 81.6 kg (180 lb)   SpO2 100%   BMI 26.58 kg/m   Physical Exam  Constitutional: She is oriented to person, place, and time. She appears well-developed and well-nourished.  HENT:  Head: Normocephalic and atraumatic.  Eyes: Right eye exhibits no discharge. Left eye exhibits no discharge.  Cardiovascular: Regular rhythm.   No murmur heard. tachypnea  Pulmonary/Chest: She is in respiratory distress. She has no rales.  Abdominal: Soft. She exhibits no distension. There is no tenderness.  Neurological: She is oriented to person, place, and time.  Skin: Skin is warm and dry. She is not diaphoretic.  Psychiatric: She has a normal mood and affect.  Nursing note and vitals reviewed.    ED Treatments / Results  Labs (all labs ordered are listed, but only abnormal results are displayed) Labs Reviewed  CBC  COMPREHENSIVE METABOLIC PANEL  URINALYSIS, ROUTINE W REFLEX MICROSCOPIC  I-STAT TROPONIN, ED  I-STAT CG4 LACTIC ACID, ED    EKG  EKG Interpretation None       Radiology No results found.  Procedures Procedures (including critical care time)  Medications Ordered in ED Medications  methylPREDNISolone sodium succinate (SOLU-MEDROL) 125 mg/2 mL injection 125 mg (not administered)     Initial Impression / Assessment and Plan / ED Course  I have reviewed the triage vital signs and the nursing notes.  Pertinent labs & imaging results that were available during my care of the patient were reviewed by me and considered in my medical decision making (see chart for details).    Patient is a 76 year old female with poor  lung function. Patient is seen by Dr. Sherene Sires, of pulmonology. She has interstitial lung disease and is on 10 mg of prednisone at baseline. She reports last 3-4 days she's had an increased cough, sputum production and increased oxygen use. Patient at baseline uses 2 liters and 6 L while walking. Patient arrived to the emergency department tachypnic, mildly hypoxic on 2 L.  Patient requiring nonrebreather, however satting 100%. Will get chest x-ray. We'll give her higher dose of steroids.  10:04 PM Patient wean back down to her home 6 L. Patient comfortable. She was able to ambulate around the department at 6 L stayed above 95%. Patient feels much more comfortable. We'll give her steroid burst to continue at home as well as azithromycin.  We'll have her  follow-up with pulmonology this week.   Final Clinical Impressions(s) / ED Diagnoses   Final diagnoses:  None    New Prescriptions New Prescriptions   No medications on file     Abelino DerrickMackuen, Jessilynn Taft Lyn, MD 09/30/16 2204

## 2016-09-30 NOTE — ED Notes (Signed)
Pt and family understood dc material. NAD noted. [facility] at [facility]

## 2016-09-30 NOTE — ED Notes (Signed)
I stat lactic acid results given to Dr. Mackuen by B. Elmus Mathes, EMT 

## 2016-10-12 ENCOUNTER — Other Ambulatory Visit: Payer: Self-pay | Admitting: Internal Medicine

## 2016-10-22 ENCOUNTER — Encounter: Payer: Self-pay | Admitting: Adult Health

## 2016-10-22 ENCOUNTER — Ambulatory Visit (INDEPENDENT_AMBULATORY_CARE_PROVIDER_SITE_OTHER): Payer: Medicare Other | Admitting: Adult Health

## 2016-10-22 DIAGNOSIS — J8410 Pulmonary fibrosis, unspecified: Secondary | ICD-10-CM

## 2016-10-22 DIAGNOSIS — J9611 Chronic respiratory failure with hypoxia: Secondary | ICD-10-CM

## 2016-10-22 NOTE — Patient Instructions (Signed)
Continue on Prednisone 15mg  daily .  Continue on Oxygen 2l/m at rest , and 6l/m walking .  Get Flu shot as discussed .  Follow up Dr. Sherene Sires  In 2 months and As needed   Follow med calendar closely and bring to each visit.

## 2016-10-22 NOTE — Assessment & Plan Note (Signed)
Continue on oxygen 2 L at rest and 6 L with activity. Goal is to keep oxygen saturations above 88-90%.

## 2016-10-22 NOTE — Progress Notes (Signed)
Chart and office note reviewed in detail  > agree with a/p as outlined    

## 2016-10-22 NOTE — Progress Notes (Signed)
_0  ID: Kim [name], female    DOB: 05/22/1940, 76 y.o.   MRN: [id number]  Chief Complaint  Patient presents with  . Follow-up    IPF     Referring provider: Lucianne Lei, MD  HPI: 76 year old female former smoker followed for ILD from remote [facility]  No previous hx ca, chemo, connective tissue dz, exp to [facility] or amiodarone to her knowledge PF on CXR dating back to 2006.   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 Patient was seen in the emergency room earlier this month for a Bronchitis  flare. She was given a Z-Pak. And a prednisone burst. Patient says she is improved. Feels like she is back to her baseline.  She is back down to her baseline dose of prednisone to 15 mg daily. She remained on oxygen 2 L at rest and 6 L while walking.  Gets winded with walking. Has a dry cough most days. Seems to be worse at night. She takes Delsym which helps some. She does have a rolling walker with a bench seat and basket. Have encouraged her to use this to help her remain active while using her oxygen    Allergies  Allergen Reactions  . Codeine  Nausea And Vomiting and Other (See Comments)    "Takes my appetite" also  . Penicillins Rash    Has patient had a PCN reaction causing immediate rash, facial/tongue/throat swelling, SOB or lightheadedness with hypotension: Yes Has patient had a PCN reaction causing severe rash involving mucus membranes or skin necrosis: No Has patient had a PCN reaction that required hospitalization No Has patient had a PCN reaction occurring within the last 10 years: No If all of the above answers are "NO", then may proceed with Cephalosporin use.     Immunization History  Administered Date(s) Administered  . Influenza, High Dose Seasonal PF 04/09/2016  . Influenza,inj,Quad PF,6+ Mos 12/06/2013  . Pneumococcal Conjugate-13 12/06/2013    Past Medical History:  Diagnosis Date  . Acute bronchiolitis due to respiratory syncytial virus (RSV) 02/08/2016  . ANA positive 02/09/2016  . Constipation due to pain medication   . Diabetes mellitus without complication ([place])   . GERD (gastroesophageal reflux disease)   . Hyperlipemia   . Hypertension   . 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 Postinflammatory pulmonary fibrosis ([place])   . Seasonal allergies     Tobacco History: History  Smoking Status  . Former Smoker  . Packs/day: 0.50  . Years: 10.00  . Types: Cigarettes  .  Quit date: 02/26/1988  Smokeless Tobacco  . Never Used   Counseling given: Not Answered   Outpatient Encounter Prescriptions as of 10/22/2016  Medication Sig  . acetaminophen (TYLENOL) 500 MG tablet Take 500 mg by mouth every 8 (eight) hours as needed (for pain or headaches). Use per bottle as needed.  Marland Kitchen aspirin EC 81 MG tablet Take 81 mg by mouth daily.  . benzonatate (TESSALON) 200 MG capsule Take 200 mg by mouth every 8 (eight) hours as needed for cough.  . famotidine (PEPCID) 20 MG tablet TAKE 1 TABLET (20 MG TOTAL) BY MOUTH AT BEDTIME.  Marland Kitchen levothyroxine (SYNTHROID, LEVOTHROID) 75 MCG tablet Take 75 mcg by mouth daily before  breakfast.  . losartan-hydrochlorothiazide (HYZAAR) 100-12.5 MG per tablet Take 1 tablet by mouth daily.  . metFORMIN (GLUCOPHAGE) 500 MG tablet TAKE 1 TABLET BY MOUTH TWICE A DAY (Patient taking differently: Take 500 mg by mouth two times a day)  . OXYGEN Inhale 2-6 L into the lungs See admin instructions. 2 liters at rest and 6 liters with activity  . pantoprazole (PROTONIX) 40 MG tablet Take 1 tablet (40 mg total) by mouth daily. Take 30-60 min before first meal of the day  . predniSONE (DELTASONE) 10 MG tablet TAKE 1 & 1/2 TABLET EVERY DAY WITH BREAKFAST  . [DISCONTINUED] azithromycin (ZITHROMAX Z-PAK) 250 MG tablet Take one daily for 4 days (Patient not taking: Reported on 10/22/2016)  . [DISCONTINUED] predniSONE (DELTASONE) 20 MG tablet Day 1 and 2: Take 3 tabs  Day 3-5: Take 2 tabs.  Day 5-8: take 1 tab (Patient not taking: Reported on 10/22/2016)   No facility-administered encounter medications on file as of 10/22/2016.      Review of Systems  Constitutional:   No  weight loss, night sweats,  Fevers, chills,  +fatigue, or  lassitude.  HEENT:   No headaches,  Difficulty swallowing,  Tooth/dental problems, or  Sore throat,                No sneezing, itching, ear ache, nasal congestion, post nasal drip,   CV:  No chest pain,  Orthopnea, PND, swelling in lower extremities, anasarca, dizziness, palpitations, syncope.   GI  No heartburn, indigestion, abdominal pain, nausea, vomiting, diarrhea, change in bowel habits, loss of appetite, bloody stools.   Resp:    No chest wall deformity  Skin: no rash or lesions.  GU: no dysuria, change in color of urine, no urgency or frequency.  No flank pain, no hematuria   MS:  No joint pain or swelling.  No decreased range of motion.  No back pain.    Physical Exam  BP 134/76 (BP Location: Left Arm, Cuff Size: Normal)   Pulse (!) 102   Ht _0  (1.753 m)   Wt 178 lb (80.7 kg)   SpO2 97%   BMI 26.29 kg/m   GEN: A/Ox3; pleasant , NAD,  elderly, chronically ill-appearing on oxygen   HEENT:  [place]/AT,  EACs-clear, TMs-wnl, NOSE-clear, THROAT-clear, no lesions, no postnasal drip or exudate noted.   NECK:  Supple w/ fair ROM; no JVD; normal carotid impulses w/o bruits; no thyromegaly or nodules palpated; no lymphadenopathy.    RESP  bibasilar crackles no accessory muscle use, no dullness to percussion  CARD:  RRR, no m/r/g, no peripheral edema, pulses intact, no cyanosis or clubbing.  GI:   Soft & nt; nml bowel sounds; no organomegaly or masses detected.   Musco: Warm bil, no deformities or joint swelling  noted.   Neuro: alert, no focal deficits noted.    Skin: Warm, no lesions or rashes    Lab Results:  CBC  BMET  BNP   ProBNP   Imaging: Dg Chest Portable 1 View  Result Date: 09/30/2016 CLINICAL DATA:  Shortness of breath beginning yesterday. On home oxygen. EXAM: PORTABLE CHEST 1 VIEW COMPARISON:  Chest radiograph August 19, 2016 and CT chest February 06, 2016 FINDINGS: Cardiomediastinal silhouette is normal. Calcified aortic knob. Low inspiratory examination. Severe chronic interstitial changes without pleural effusion or focal consolidation. No pneumothorax. Soft tissue planes and included osseous structures are nonsuspicious. IMPRESSION: Stable severe chronic interstitial lung disease. Aortic Atherosclerosis (ICD10-I70.0). Electronically Signed   By: Elon Alas M.D.   On: 09/30/2016 18:17     Assessment & Plan:   Postinflammatory pulmonary fibrosis (history of) Recent flare with bronchitis seems to be improving. Patient's been unable to go below prednisone 15 mg without flare of symptoms.  Patient's continue on her current regimen  Chronic respiratory failure with hypoxia (HCC) Continue on oxygen 2 L at rest and 6 L with activity. Goal is to keep oxygen saturations above 88-90%.     Rexene Edison, NP 10/22/2016

## 2016-10-22 NOTE — Assessment & Plan Note (Signed)
Recent flare with bronchitis seems to be improving. Patient's been unable to go below prednisone 15 mg without flare of symptoms.  Patient's continue on her current regimen

## 2016-12-04 ENCOUNTER — Inpatient Hospital Stay (HOSPITAL_COMMUNITY)
Admission: EM | Admit: 2016-12-04 | Discharge: 2016-12-09 | DRG: 196 | Disposition: A | Discharge status: Home Health | Payer: Medicare Other | Attending: Nephrology | Admitting: Nephrology

## 2016-12-04 ENCOUNTER — Emergency Department (HOSPITAL_COMMUNITY): Payer: Medicare Other

## 2016-12-04 ENCOUNTER — Encounter (HOSPITAL_COMMUNITY): Payer: Self-pay | Admitting: Emergency Medicine

## 2016-12-04 DIAGNOSIS — E7800 Pure hypercholesterolemia, unspecified: Secondary | ICD-10-CM | POA: Diagnosis present

## 2016-12-04 DIAGNOSIS — R0602 Shortness of breath: Secondary | ICD-10-CM

## 2016-12-04 DIAGNOSIS — Z880 Allergy status to penicillin: Secondary | ICD-10-CM

## 2016-12-04 DIAGNOSIS — E039 Hypothyroidism, unspecified: Secondary | ICD-10-CM | POA: Diagnosis present

## 2016-12-04 DIAGNOSIS — J8410 Pulmonary fibrosis, unspecified: Principal | ICD-10-CM | POA: Diagnosis present

## 2016-12-04 DIAGNOSIS — Z8249 Family history of ischemic heart disease and other diseases of the circulatory system: Secondary | ICD-10-CM

## 2016-12-04 DIAGNOSIS — Z7984 Long term (current) use of oral hypoglycemic drugs: Secondary | ICD-10-CM

## 2016-12-04 DIAGNOSIS — J9601 Acute respiratory failure with hypoxia: Secondary | ICD-10-CM | POA: Diagnosis not present

## 2016-12-04 DIAGNOSIS — E119 Type 2 diabetes mellitus without complications: Secondary | ICD-10-CM | POA: Diagnosis present

## 2016-12-04 DIAGNOSIS — Z9981 Dependence on supplemental oxygen: Secondary | ICD-10-CM

## 2016-12-04 DIAGNOSIS — J9621 Acute and chronic respiratory failure with hypoxia: Secondary | ICD-10-CM | POA: Diagnosis present

## 2016-12-04 DIAGNOSIS — E118 Type 2 diabetes mellitus with unspecified complications: Secondary | ICD-10-CM | POA: Diagnosis not present

## 2016-12-04 DIAGNOSIS — I10 Essential (primary) hypertension: Secondary | ICD-10-CM | POA: Diagnosis present

## 2016-12-04 DIAGNOSIS — Z7982 Long term (current) use of aspirin: Secondary | ICD-10-CM

## 2016-12-04 DIAGNOSIS — J849 Interstitial pulmonary disease, unspecified: Secondary | ICD-10-CM

## 2016-12-04 DIAGNOSIS — D72829 Elevated white blood cell count, unspecified: Secondary | ICD-10-CM | POA: Diagnosis present

## 2016-12-04 DIAGNOSIS — Z800 Family history of malignant neoplasm of digestive organs: Secondary | ICD-10-CM

## 2016-12-04 DIAGNOSIS — R0603 Acute respiratory distress: Secondary | ICD-10-CM

## 2016-12-04 DIAGNOSIS — Z79899 Other long term (current) drug therapy: Secondary | ICD-10-CM

## 2016-12-04 DIAGNOSIS — Z96651 Presence of right artificial knee joint: Secondary | ICD-10-CM | POA: Diagnosis present

## 2016-12-04 DIAGNOSIS — Z515 Encounter for palliative care: Secondary | ICD-10-CM

## 2016-12-04 DIAGNOSIS — Z7189 Other specified counseling: Secondary | ICD-10-CM

## 2016-12-04 DIAGNOSIS — Y92230 Patient room in hospital as the place of occurrence of the external cause: Secondary | ICD-10-CM | POA: Diagnosis present

## 2016-12-04 DIAGNOSIS — Z66 Do not resuscitate: Secondary | ICD-10-CM | POA: Diagnosis present

## 2016-12-04 DIAGNOSIS — Z79890 Hormone replacement therapy (postmenopausal): Secondary | ICD-10-CM

## 2016-12-04 DIAGNOSIS — R51 Headache: Secondary | ICD-10-CM | POA: Diagnosis not present

## 2016-12-04 DIAGNOSIS — K219 Gastro-esophageal reflux disease without esophagitis: Secondary | ICD-10-CM | POA: Diagnosis present

## 2016-12-04 DIAGNOSIS — T380X5A Adverse effect of glucocorticoids and synthetic analogues, initial encounter: Secondary | ICD-10-CM | POA: Diagnosis present

## 2016-12-04 DIAGNOSIS — Z87891 Personal history of nicotine dependence: Secondary | ICD-10-CM

## 2016-12-04 DIAGNOSIS — Z885 Allergy status to narcotic agent status: Secondary | ICD-10-CM

## 2016-12-04 DIAGNOSIS — J9690 Respiratory failure, unspecified, unspecified whether with hypoxia or hypercapnia: Secondary | ICD-10-CM | POA: Diagnosis present

## 2016-12-04 DIAGNOSIS — Z90710 Acquired absence of both cervix and uterus: Secondary | ICD-10-CM

## 2016-12-04 DIAGNOSIS — Z7952 Long term (current) use of systemic steroids: Secondary | ICD-10-CM

## 2016-12-04 DIAGNOSIS — E785 Hyperlipidemia, unspecified: Secondary | ICD-10-CM | POA: Diagnosis present

## 2016-12-04 LAB — CBC WITH DIFFERENTIAL/PLATELET
BASOS ABS: 0 10*3/uL (ref 0.0–0.1)
Basophils Relative: 0 %
EOS ABS: 0.5 10*3/uL (ref 0.0–0.7)
Eosinophils Relative: 3 %
HCT: 37.7 % (ref 36.0–46.0)
HEMOGLOBIN: 11.3 g/dL — AB (ref 12.0–15.0)
LYMPHS PCT: 39 %
Lymphs Abs: 7.1 10*3/uL — ABNORMAL HIGH (ref 0.7–4.0)
MCH: 23.3 pg — ABNORMAL LOW (ref 26.0–34.0)
MCHC: 30 g/dL (ref 30.0–36.0)
MCV: 77.9 fL — ABNORMAL LOW (ref 78.0–100.0)
Monocytes Absolute: 1.1 10*3/uL — ABNORMAL HIGH (ref 0.1–1.0)
Monocytes Relative: 6 %
NEUTROS ABS: 9.5 10*3/uL — AB (ref 1.7–7.7)
Neutrophils Relative %: 52 %
Platelets: 325 10*3/uL (ref 150–400)
RBC: 4.84 MIL/uL (ref 3.87–5.11)
RDW: 15.9 % — AB (ref 11.5–15.5)
WBC: 18.2 10*3/uL — AB (ref 4.0–10.5)

## 2016-12-04 LAB — D-DIMER, QUANTITATIVE (NOT AT ARMC): D DIMER QUANT: 0.32 ug{FEU}/mL (ref 0.00–0.50)

## 2016-12-04 LAB — COMPREHENSIVE METABOLIC PANEL
ALBUMIN: 3.2 g/dL — AB (ref 3.5–5.0)
ALK PHOS: 58 U/L (ref 38–126)
ALT: 11 U/L — ABNORMAL LOW (ref 14–54)
ANION GAP: 14 (ref 5–15)
AST: 23 U/L (ref 15–41)
BUN: 11 mg/dL (ref 6–20)
CO2: 27 mmol/L (ref 22–32)
Calcium: 8.9 mg/dL (ref 8.9–10.3)
Chloride: 98 mmol/L — ABNORMAL LOW (ref 101–111)
Creatinine, Ser: 0.98 mg/dL (ref 0.44–1.00)
GFR calc Af Amer: >60 mL/min (ref >60)
GFR calc non Af Amer: 55 mL/min — ABNORMAL LOW (ref >60)
GLUCOSE: 103 mg/dL — AB (ref 65–99)
POTASSIUM: 3.4 mmol/L — AB (ref 3.5–5.1)
SODIUM: 139 mmol/L (ref 135–145)
Total Bilirubin: 0.5 mg/dL (ref 0.3–1.2)
Total Protein: 6.8 g/dL (ref 6.5–8.1)

## 2016-12-04 LAB — CBG MONITORING, ED: Glucose-Capillary: 161 mg/dL — ABNORMAL HIGH (ref 65–99)

## 2016-12-04 LAB — GLUCOSE, CAPILLARY: Glucose-Capillary: 184 mg/dL — ABNORMAL HIGH (ref 65–99)

## 2016-12-04 LAB — I-STAT TROPONIN, ED: Troponin i, poc: 0 ng/mL (ref 0.00–0.08)

## 2016-12-04 MED ORDER — LOSARTAN POTASSIUM-HCTZ 100-12.5 MG PO TABS
1.0000 | ORAL_TABLET | Freq: Every day | ORAL | Status: DC
Start: 2016-12-04 — End: 2016-12-04

## 2016-12-04 MED ORDER — METHYLPREDNISOLONE SODIUM SUCC 125 MG IJ SOLR
60.0000 mg | Freq: Two times a day (BID) | INTRAMUSCULAR | Status: DC
  Administered 2016-12-04 – 2016-12-05 (×2): 60 mg via INTRAVENOUS
  Filled 2016-12-04 (×2): qty 2

## 2016-12-04 MED ORDER — HEPARIN SODIUM (PORCINE) 5000 UNIT/ML IJ SOLN
5000.0000 [IU] | Freq: Three times a day (TID) | INTRAMUSCULAR | Status: DC
  Administered 2016-12-04 – 2016-12-06 (×5): 5000 [IU] via SUBCUTANEOUS
  Filled 2016-12-04 (×6): qty 1

## 2016-12-04 MED ORDER — ALBUTEROL SULFATE (2.5 MG/3ML) 0.083% IN NEBU
INHALATION_SOLUTION | RESPIRATORY_TRACT | Status: AC
  Filled 2016-12-04: qty 3

## 2016-12-04 MED ORDER — INSULIN ASPART 100 UNIT/ML ~~LOC~~ SOLN
0.0000 [IU] | Freq: Three times a day (TID) | SUBCUTANEOUS | Status: DC
  Administered 2016-12-04: 2 [IU] via SUBCUTANEOUS
  Administered 2016-12-05: 3 [IU] via SUBCUTANEOUS
  Administered 2016-12-05: 2 [IU] via SUBCUTANEOUS
  Administered 2016-12-05: 1 [IU] via SUBCUTANEOUS
  Administered 2016-12-06: 2 [IU] via SUBCUTANEOUS
  Administered 2016-12-06 (×2): 3 [IU] via SUBCUTANEOUS
  Administered 2016-12-07: 5 [IU] via SUBCUTANEOUS
  Administered 2016-12-07: 2 [IU] via SUBCUTANEOUS
  Administered 2016-12-07: 3 [IU] via SUBCUTANEOUS
  Administered 2016-12-08: 5 [IU] via SUBCUTANEOUS
  Administered 2016-12-08 – 2016-12-09 (×3): 2 [IU] via SUBCUTANEOUS
  Filled 2016-12-04: qty 1

## 2016-12-04 MED ORDER — METHYLPREDNISOLONE SODIUM SUCC 125 MG IJ SOLR
125.0000 mg | Freq: Once | INTRAMUSCULAR | Status: AC
  Administered 2016-12-04: 125 mg via INTRAVENOUS
  Filled 2016-12-04: qty 2

## 2016-12-04 MED ORDER — LEVOTHYROXINE SODIUM 75 MCG PO TABS
75.0000 ug | ORAL_TABLET | Freq: Every day | ORAL | Status: DC
  Administered 2016-12-05 – 2016-12-09 (×5): 75 ug via ORAL
  Filled 2016-12-04 (×5): qty 1

## 2016-12-04 MED ORDER — IPRATROPIUM-ALBUTEROL 0.5-2.5 (3) MG/3ML IN SOLN
3.0000 mL | Freq: Four times a day (QID) | RESPIRATORY_TRACT | Status: DC
  Administered 2016-12-04: 3 mL via RESPIRATORY_TRACT
  Filled 2016-12-04: qty 3

## 2016-12-04 MED ORDER — IPRATROPIUM-ALBUTEROL 0.5-2.5 (3) MG/3ML IN SOLN: 3.0000 mL | Freq: Three times a day (TID) | RESPIRATORY_TRACT | Status: DC

## 2016-12-04 MED ORDER — ENSURE ENLIVE PO LIQD
237.0000 mL | Freq: Two times a day (BID) | ORAL | Status: DC
  Administered 2016-12-05 (×2): 237 mL via ORAL

## 2016-12-04 MED ORDER — GUAIFENESIN ER 600 MG PO TB12
1200.0000 mg | ORAL_TABLET | Freq: Two times a day (BID) | ORAL | Status: DC
  Administered 2016-12-04 – 2016-12-09 (×10): 1200 mg via ORAL
  Filled 2016-12-04 (×10): qty 2

## 2016-12-04 MED ORDER — ALBUTEROL SULFATE (2.5 MG/3ML) 0.083% IN NEBU
5.0000 mg | INHALATION_SOLUTION | Freq: Once | RESPIRATORY_TRACT | Status: AC
  Administered 2016-12-04: 5 mg via RESPIRATORY_TRACT

## 2016-12-04 MED ORDER — INSULIN ASPART 100 UNIT/ML ~~LOC~~ SOLN
0.0000 [IU] | Freq: Every day | SUBCUTANEOUS | Status: DC
  Administered 2016-12-08: 2 [IU] via SUBCUTANEOUS

## 2016-12-04 MED ORDER — ASPIRIN EC 81 MG PO TBEC
81.0000 mg | DELAYED_RELEASE_TABLET | Freq: Every day | ORAL | Status: DC
  Administered 2016-12-04 – 2016-12-09 (×6): 81 mg via ORAL
  Filled 2016-12-04 (×6): qty 1

## 2016-12-04 MED ORDER — FAMOTIDINE 20 MG PO TABS
20.0000 mg | ORAL_TABLET | Freq: Every day | ORAL | Status: DC
  Administered 2016-12-04 – 2016-12-08 (×5): 20 mg via ORAL
  Filled 2016-12-04 (×5): qty 1

## 2016-12-04 MED ORDER — LOSARTAN POTASSIUM 50 MG PO TABS
100.0000 mg | ORAL_TABLET | Freq: Every day | ORAL | Status: DC
  Administered 2016-12-05 – 2016-12-09 (×5): 100 mg via ORAL
  Filled 2016-12-04 (×5): qty 2

## 2016-12-04 MED ORDER — HYDROCHLOROTHIAZIDE 12.5 MG PO CAPS
12.5000 mg | ORAL_CAPSULE | Freq: Every day | ORAL | Status: DC
  Administered 2016-12-05 – 2016-12-09 (×5): 12.5 mg via ORAL
  Filled 2016-12-04 (×5): qty 1

## 2016-12-04 NOTE — Progress Notes (Signed)
RT was requested by RN in hallway to follow with pt in resp distress to Trauma B. Pt placed on NRB with spo2 initially 83% improved to 100% when pt placed in bed. Pt coughing and having Increased WOB with cough mostly. Pt titrated to 8L HFNC with improvement in breathing. Pt with hx of pulmonary fibrosis on 2L [place] 24/7 with spo2 goal 88%. RN aware and will continue to titrate HFNC to baseline. RT will continue to closely monitor pt

## 2016-12-04 NOTE — H&P (Signed)
History and Physical    Kim ALGUIRE [name]:[id number] DOB: 1940/11/25 DOA: 12/04/2016  PCP: Renaye Rakers, MD Patient coming from: PCP office - Dr Ronnette Juniper  Chief Complaint: low oxygen and shortness of breath  HPI: Kim [name] is a 76 y.o. female with medical history significant of ILD, HTN, HLD, GERD, DM. Patient presents from her primary care physician's office and acute respiratory failure. Patient can by private vehicle after developing rapid onset shortness of breath with noted hypoxia despite supplemental oxygen administration. Patient reports several day history of worsening cough and shortness of breath. Cough is nonproductive and associated with chills but denies fevers, lower extremity swelling, chest pain, orthopnea. Time of arrival in the emergency room triage area patient was noted to be hypoxic with O2 saturations in the mid 60s despite being on her home O2 of 2 L. Patient required nebulizer treatments and steroids with oxygen administration to 6 L in order to get O2 saturations above 90%.  ED Course: as abve  Review of Systems: As per HPI otherwise all other systems reviewed and are negative  Ambulatory Status:restricted due to pulmonary status  Past Medical History:  Diagnosis Date  . Acute bronchiolitis due to respiratory syncytial virus (RSV) 02/08/2016  . ANA positive 02/09/2016  . Constipation due to pain medication   . Diabetes mellitus without complication (HCC)   . GERD (gastroesophageal reflux disease)   . Hyperlipemia   . Hypertension   . 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 Postinflammatory pulmonary fibrosis (HCC)   . Seasonal allergies     Past Surgical History:  Procedure Laterality Date  . ABDOMINAL HYSTERECTOMY    . COLONOSCOPY    . HERNIA REPAIR     X 2  . SMALL INTESTINE SURGERY    . 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 Marital status: Married    Spouse name: N/A  . Number of children: N/A  . Years of education: N/A   Occupational History  . Not on file.   Social History Main Topics  . Smoking status: Former Smoker    Packs/day: 0.50    Years: 10.00    Types: Cigarettes    Quit date: 02/26/1988  . Smokeless tobacco: Never Used  . Alcohol use No  . Drug use: No  . Sexual activity: Not on file   Other Topics Concern  . Not on file   Social History Narrative  . No narrative on file    Allergies  Allergen Reactions  . Codeine Nausea And Vomiting and Other (See Comments)    "Takes my appetite" also  . Penicillins Rash    Has patient had a PCN reaction causing immediate rash, facial/tongue/throat swelling, SOB or lightheadedness with hypotension: Yes Has patient had a PCN reaction causing severe rash involving mucus membranes or skin necrosis: No Has patient had a PCN reaction that required hospitalization No Has patient had a PCN reaction occurring within the last 10 years: No If all of the above answers are "NO", then may proceed with Cephalosporin use.     Family History  Problem Relation Age of Onset  . Stomach cancer Sister   . Heart attack Sister       Prior to Admission medications   Medication Sig Start Date End Date Taking? Authorizing Provider  acetaminophen (TYLENOL) 500 MG  tablet Take 500 mg by mouth every 8 (eight) hours as needed (for pain or headaches). Use per bottle as needed.   Yes [provider]  aspirin EC 81 MG tablet Take 81 mg by mouth daily.   Yes [provider]  benzonatate (TESSALON) 200 MG capsule Take 200 mg by mouth every 8 (eight) hours as needed for cough.   Yes [provider]  famotidine (PEPCID) 20 MG tablet TAKE 1 TABLET (20 MG TOTAL) BY MOUTH AT BEDTIME. 07/04/16  Yes Nyoka Cowden, MD  levothyroxine (SYNTHROID, LEVOTHROID) 75 MCG tablet Take 75 mcg by mouth  daily before breakfast.   Yes [provider]  losartan-hydrochlorothiazide (HYZAAR) 100-12.5 MG per tablet Take 1 tablet by mouth daily.   Yes [provider]  metFORMIN (GLUCOPHAGE) 500 MG tablet TAKE 1 TABLET BY MOUTH TWICE A DAY Patient taking differently: Take 500 mg by mouth two times a day 03/31/13  Yes Worthy Rancher B, FNP  OXYGEN Inhale 2-6 L into the lungs See admin instructions. 2 liters at rest and 6 liters with activity   Yes [provider]  predniSONE (DELTASONE) 10 MG tablet TAKE 1 & 1/2 TABLET EVERY DAY WITH BREAKFAST 10/14/16  Yes Nyoka Cowden, MD  pantoprazole (PROTONIX) 40 MG tablet Take 1 tablet (40 mg total) by mouth daily. Take 30-60 min before first meal of the day Patient not taking: Reported on 12/04/2016 08/22/16   Nyoka Cowden, MD    Physical Exam: Vitals:   12/04/16 1430 12/04/16 1453 12/04/16 1500 12/04/16 1526  BP: (!) 117/56 123/63 (!) 169/151 114/62  Pulse: 99 (!) 108 (!) 122 (!) 104  Resp: (!) 33  (!) 31 (!) 33  Temp:    98.2 F (36.8 C)  TempSrc:      SpO2: 100% 100% 95%   Weight:         General:  Appears calm and comfortable Eyes:  PERRL, EOMI, normal lids, iris ENT:  grossly normal hearing, lips & tongue, mmm Neck:  no LAD, masses or thyromegaly Cardiovascular:  RRR, no m/r/g. No LE edema.  Respiratory: Diminished throughout. Increased effort on 4 L, intermittent wheezing. Abdomen:  soft, ntnd, NABS Skin:  no rash or induration seen on limited exam Musculoskeletal:  grossly normal tone BUE/BLE, good ROM, no bony abnormality Psychiatric:  grossly normal mood and affect, speech fluent and appropriate, AOx3 Neurologic:  CN 2-12 grossly intact, moves all extremities in coordinated fashion, sensation intact  Labs on Admission: I have personally reviewed following labs and imaging studies  CBC:  Recent Labs Lab 12/04/16 1210  WBC 18.2*  NEUTROABS 9.5*  HGB 11.3*  HCT 37.7  MCV 77.9*  PLT 325   Basic  Metabolic Panel:  Recent Labs Lab 12/04/16 1210  NA 139  K 3.4*  CL 98*  CO2 27  GLUCOSE 103*  BUN 11  CREATININE 0.98  CALCIUM 8.9   GFR: Estimated Creatinine Clearance: 56.4 mL/min (by C-G formula based on SCr of 0.98 mg/dL). Liver Function Tests:  Recent Labs Lab 12/04/16 1210  AST 23  ALT 11*  ALKPHOS 58  BILITOT 0.5  PROT 6.8  ALBUMIN 3.2*   No results for input(s): LIPASE, AMYLASE in the last 168 hours. No results for input(s): AMMONIA in the last 168 hours. Coagulation Profile: No results for input(s): INR, PROTIME in the last 168 hours. Cardiac Enzymes: No results for input(s): CKTOTAL, CKMB, CKMBINDEX, TROPONINI in the last 168 hours. BNP (last 3 results)  Recent Labs  03/01/16 1537  PROBNP 31.0   HbA1C: No results for input(s): HGBA1C in the last 72 hours. CBG: No results for input(s): GLUCAP in the last 168 hours. Lipid Profile: No results for input(s): CHOL, HDL, LDLCALC, TRIG, CHOLHDL, LDLDIRECT in the last 72 hours. Thyroid Function Tests: No results for input(s): TSH, T4TOTAL, FREET4, T3FREE, THYROIDAB in the last 72 hours. Anemia Panel: No results for input(s): VITAMINB12, FOLATE, FERRITIN, TIBC, IRON, RETICCTPCT in the last 72 hours. Urine analysis:    Component Value Date/Time   COLORURINE YELLOW 02/05/2016 2233   APPEARANCEUR HAZY (A) 02/05/2016 2233   LABSPEC 1.014 02/05/2016 2233   PHURINE 7.0 02/05/2016 2233   GLUCOSEU NEGATIVE 02/05/2016 2233   HGBUR NEGATIVE 02/05/2016 2233   BILIRUBINUR NEGATIVE 02/05/2016 2233   KETONESUR NEGATIVE 02/05/2016 2233   PROTEINUR NEGATIVE 02/05/2016 2233   UROBILINOGEN 0.2 10/04/2014 0100   NITRITE NEGATIVE 02/05/2016 2233   LEUKOCYTESUR LARGE (A) 02/05/2016 2233    Creatinine Clearance: Estimated Creatinine Clearance: 56.4 mL/min (by C-G formula based on SCr of 0.98 mg/dL).  Sepsis Labs: (procalcitonin:4,lacticidven:4) )No results found for this or any previous visit (from the  past 240 hour(s)).   Radiological Exams on Admission: Dg Chest Portable 1 View  Result Date: 12/04/2016 CLINICAL DATA:  Severe shortness of breath and cough. EXAM: PORTABLE CHEST 1 VIEW COMPARISON:  09/30/2016 FINDINGS: The cardiac silhouette, mediastinal and hilar contours are within normal limits and stable. There is tortuosity and calcification of the thoracic aorta. Stable severe interstitial lung disease with pulmonary fibrosis and volume loss. No definite acute overlying pulmonary process is identified. The bony thorax is intact. IMPRESSION: Severe chronic lung disease without definite acute overlying pulmonary process. Electronically Signed   By: Rudie Meyer M.D.   On: 12/04/2016 11:33    EKG: Independently reviewed. Sinus. No ACS  Assessment/Plan Active Problems:   Respiratory failure (HCC)   Acute on chronic respiratory failure: Cause of flare unclear. O2 dropped as low as 66% on home O2 of 2L. CXR w/o acute process. D-dimer negative.  - Wean O2 as able (currently requiring 4L [place]) - Solumedrol 60 Q12 (on prednisone 15 Qday at baseline) - Respiratory viral panel  DM: - continue SSI  HTN: - continue Hyzaar  Hypothyroid: - continue synthroid  GERD: - continue pepcid  DVT prophylaxis: hep  Code Status: full  Family Communication: none  Disposition Plan: pending improvement in respiratory status  Consults called: none  Admission status: observation    Ozella Rocks MD [facility]  If 7PM-7AM, please contact night-coverage [email or web address] Password [email or web address]  12/04/2016, 3:35 PM

## 2016-12-04 NOTE — ED Provider Notes (Signed)
[facility]-EMERGENCY DEPT Provider Note   CSN: [id number] Arrival date & time: 12/04/16  1044     History   Chief Complaint Chief Complaint  Patient presents with  . Shortness of Breath    HPI Kim [name] is a 76 y.o. female.  HPI 76 year old female who presents with shortness of breath. She has a history of postinflammatory pulmonary fibrosis/ILD. She has chronic respiratory failure on 2 L at rest, and 6 L with activity. She also has a history of hypertension, hyperlipidemia, diabetes. She had a routine appointment at Dr. Tedra [place] office today. States that she suddenly became short of breath during her appointment. She has been having productive cough, chills and sweats but no known fever. No lower extremity edema, chest pain, orthopnea PND. No history of PE/DVT.  Arrived to ED by POV. Per report, she was 66% on 2 L. Placed on 6L on arrival with pulse ox 87%.   Past Medical History:  Diagnosis Date  . Acute bronchiolitis due to respiratory syncytial virus (RSV) 02/08/2016  . ANA positive 02/09/2016  . Constipation due to pain medication   . Diabetes mellitus without complication (HCC)   . GERD (gastroesophageal reflux disease)   . Hyperlipemia   . Hypertension   . 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 Postinflammatory pulmonary fibrosis (HCC)   . Seasonal allergies     Patient Active Problem List   Diagnosis Date Noted  . Respiratory failure (HCC) 12/04/2016  . ANA positive 02/09/2016  . Acute bronchiolitis due to respiratory syncytial virus (RSV) 02/08/2016  . Chronic respiratory failure with hypoxia (HCC) 02/06/2016  . HCAP (healthcare-associated pneumonia) 02/06/2016  . 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 Acute respiratory failure with hypoxia (HCC) 01/25/2016  . Hypothyroidism 01/25/2016  . 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 Pure hypercholesterolemia 07/29/2012  . Diabetes mellitus  type 2 in obese (HCC) 01/16/2010  . HTN (hypertension) 01/16/2010  . Upper airway cough syndrome 01/12/2010    Past Surgical History:  Procedure Laterality Date  . ABDOMINAL HYSTERECTOMY    . COLONOSCOPY    . HERNIA REPAIR     X 2  . SMALL INTESTINE SURGERY    . TOTAL KNEE ARTHROPLASTY Right 07/06/2014   Procedure: RIGHT TOTAL KNEE ARTHROPLASTY;  Surgeon: Mckinley Jewel, MD;  Location: [facility] OR;  Service: Orthopedics;  Laterality: Right;    OB History    No data available       Home Medications    Prior to Admission medications   Medication Sig Start Date End Date Taking? Authorizing Provider  acetaminophen (TYLENOL) 500 MG tablet Take 500 mg by mouth every 8 (eight) hours as needed (for pain or headaches). Use per bottle as needed.   Yes [provider]  aspirin EC 81 MG tablet Take 81 mg by mouth daily.   Yes [provider]  benzonatate (TESSALON) 200 MG capsule Take 200 mg by mouth every 8 (eight) hours as needed for cough.   Yes [provider]  famotidine (PEPCID) 20 MG tablet TAKE 1 TABLET (20 MG TOTAL) BY MOUTH AT BEDTIME. 07/04/16  Yes Nyoka Cowden, MD  levothyroxine (SYNTHROID, LEVOTHROID) 75 MCG tablet Take 75 mcg by mouth daily before breakfast.   Yes [provider]  losartan-hydrochlorothiazide (HYZAAR) 100-12.5 MG per tablet Take 1 tablet by mouth daily.   Yes [provider]  metFORMIN (GLUCOPHAGE) 500 MG tablet TAKE 1 TABLET BY MOUTH TWICE A DAY  Patient taking differently: Take 500 mg by mouth two times a day 03/31/13  Yes Worthy Rancher B, FNP  OXYGEN Inhale 2-6 L into the lungs See admin instructions. 2 liters at rest and 6 liters with activity   Yes [provider]  predniSONE (DELTASONE) 10 MG tablet TAKE 1 & 1/2 TABLET EVERY DAY WITH BREAKFAST 10/14/16  Yes Nyoka Cowden, MD  pantoprazole (PROTONIX) 40 MG tablet Take 1 tablet (40 mg total) by mouth daily. Take 30-60 min before first meal of the day Patient not  taking: Reported on 12/04/2016 08/22/16   Nyoka Cowden, MD    Family History Family History  Problem Relation Age of Onset  . Stomach cancer Sister   . Heart attack Sister     Social History Social History  Substance Use Topics  . Smoking status: Former Smoker    Packs/day: 0.50    Years: 10.00    Types: Cigarettes    Quit date: 02/26/1988  . Smokeless tobacco: Never Used  . Alcohol use No     Allergies   Codeine and Penicillins   Review of Systems Review of Systems  Constitutional: Negative for fever.  Respiratory: Positive for cough and shortness of breath.   Cardiovascular: Negative for chest pain and leg swelling.  Gastrointestinal: Negative for abdominal pain, nausea and vomiting.  All other systems reviewed and are negative.    Physical Exam Updated Vital Signs BP 114/62   Pulse (!) 104   Temp 98.2 F (36.8 C)   Resp (!) 33   Wt 80.7 kg (178 lb)   SpO2 95%   BMI 26.29 kg/m   Physical Exam Physical Exam  Nursing note and vitals reviewed. Constitutional:  non-toxic, and in moderate respiratory distress Head: Normocephalic and atraumatic.  Mouth/Throat: Oropharynx is clear and moist.  Neck: Normal range of motion. Neck supple.  Cardiovascular: Tachycardic rate and regular rhythm.   Pulmonary/Chest: Effort increased and breath sounds normal. No wheezes rales or rhonchi. Abdominal: Soft. There is no tenderness. There is no rebound and no guarding.  Musculoskeletal: Normal range of motion. No edema. Neurological: Alert, no facial droop, fluent speech, moves all extremities symmetrically Skin: Skin is warm and dry.  Psychiatric: Cooperative   ED Treatments / Results  Labs (all labs ordered are listed, but only abnormal results are displayed) Labs Reviewed  CBC WITH DIFFERENTIAL/PLATELET - Abnormal; Notable for the following:       Result Value   WBC 18.2 (*)    Hemoglobin 11.3 (*)    MCV 77.9 (*)    MCH 23.3 (*)    RDW 15.9 (*)    Neutro Abs  9.5 (*)    Lymphs Abs 7.1 (*)    Monocytes Absolute 1.1 (*)    All other components within normal limits  COMPREHENSIVE METABOLIC PANEL - Abnormal; Notable for the following:    Potassium 3.4 (*)    Chloride 98 (*)    Glucose, Bld 103 (*)    Albumin 3.2 (*)    ALT 11 (*)    GFR calc non Af Amer 55 (*)    All other components within normal limits  D-DIMER, QUANTITATIVE (NOT AT [facility])  I-STAT TROPONIN, ED    EKG  EKG Interpretation  Date/Time:  Wednesday December 04 2016 10:59:26 EDT Ventricular Rate:  121 PR Interval:  160 QRS Duration: 82 QT Interval:  296 QTC Calculation: 420 R Axis:   15 Text Interpretation:  Sinus tachycardia Possible Anterior infarct , age  undetermined Abnormal ECG other than tachycardia, no significant changes  Confirmed by Crista Curb [phone]) on 12/04/2016 12:21:17 PM       Radiology Dg Chest Portable 1 View  Result Date: 12/04/2016 CLINICAL DATA:  Severe shortness of breath and cough. EXAM: PORTABLE CHEST 1 VIEW COMPARISON:  09/30/2016 FINDINGS: The cardiac silhouette, mediastinal and hilar contours are within normal limits and stable. There is tortuosity and calcification of the thoracic aorta. Stable severe interstitial lung disease with pulmonary fibrosis and volume loss. No definite acute overlying pulmonary process is identified. The bony thorax is intact. IMPRESSION: Severe chronic lung disease without definite acute overlying pulmonary process. Electronically Signed   By: Rudie Meyer M.D.   On: 12/04/2016 11:33    Procedures Procedures (including critical care time)  Medications Ordered in ED Medications  albuterol (PROVENTIL) (2.5 MG/3ML) 0.083% nebulizer solution 5 mg (5 mg Nebulization Not Given 12/04/16 1100)  methylPREDNISolone sodium succinate (SOLU-MEDROL) 125 mg/2 mL injection 125 mg (125 mg Intravenous Given 12/04/16 1243)     Initial Impression / Assessment and Plan / ED Course  I have reviewed the triage vital signs and the  nursing notes.  Pertinent labs & imaging results that were available during my care of the patient were reviewed by me and considered in my medical decision making (see chart for details).     Initially, in severe respiratory distress. Speaking in short sentences, accessory muscle usage. Lungs with crackles, c/w ILD.   Initially on NRB, and then on 6L high flow [place] with pulse ox 100% and gradual improvement in work of breathing. CXR with fibrotic changes, but no infiltrate or other acute cardiopulmonary process. EKG w/ tachycardia but no ischemic changes. Troponin negative. ddimer negative and no further evaluation for PE felt necessary as low risk.  Gradually weaning down on oxygen, now 4L [place]. Still has tachypnea and with tachycardia. With leukocytosis, but on chronic steroids. Did receive solumedrol for possible ILD flare. Will continue to wean oxygen as tolerated, but will plan on admission for observation for treatment of possible ILD flare  Final Clinical Impressions(s) / ED Diagnoses   Final diagnoses:  ILD (interstitial lung disease) (HCC)  SOB (shortness of breath)    New Prescriptions New Prescriptions   No medications on file     Lavera Guise, MD 12/04/16 1536

## 2016-12-04 NOTE — ED Triage Notes (Signed)
PT in respiratory distress. Pt 89% on 5L O2 via nasal cannulla. Bumped to 6L, 92%. Placed on breathing tx. Denies any chest pain. Pt has productive cough. Afebrile with oral temp at triage however pt states chills and sweats. Pt daughter states she has a lung disease to 1 lung and wears 2L at rest, 6 with activity. Pt O2 tank only goes up to 4L. Pt not able to tolerate activity w the 4L.

## 2016-12-04 NOTE — ED Notes (Signed)
Pt to trauma B from triage with resp distress-- high pitched cough, resp therapist at bedside, O2 on NRB-- switched to high flow nasal cannula-- pt breathing easier-- daughter at bedside.  Was at Dr. Tedra [place] office and sent here by private vehicle-

## 2016-12-04 NOTE — Progress Notes (Signed)
Arrived from ED in stable condition. VSS; O2-100% on 4L [place]. Oriented to room and unit. Daughters present at bedside. Orders reviewed. Assessment completed as charted. Will continue to monitor.

## 2016-12-04 NOTE — ED Notes (Signed)
Patient states she is feeling better , Dr. Joni Fears  Wants to wean patient to her normal. 02 decreased to 4 liters [place] Deputy

## 2016-12-04 NOTE — ED Notes (Signed)
Attempted report x1. 

## 2016-12-04 NOTE — ED Notes (Signed)
Patient is breathing much better , able to speak in complete sentences.

## 2016-12-04 NOTE — ED Notes (Signed)
Sl headache

## 2016-12-05 ENCOUNTER — Observation Stay (HOSPITAL_COMMUNITY): Payer: Medicare Other

## 2016-12-05 ENCOUNTER — Inpatient Hospital Stay (HOSPITAL_COMMUNITY): Payer: Medicare Other

## 2016-12-05 DIAGNOSIS — Y92230 Patient room in hospital as the place of occurrence of the external cause: Secondary | ICD-10-CM | POA: Diagnosis present

## 2016-12-05 DIAGNOSIS — Z87891 Personal history of nicotine dependence: Secondary | ICD-10-CM | POA: Diagnosis not present

## 2016-12-05 DIAGNOSIS — Z66 Do not resuscitate: Secondary | ICD-10-CM | POA: Diagnosis present

## 2016-12-05 DIAGNOSIS — Z79899 Other long term (current) drug therapy: Secondary | ICD-10-CM | POA: Diagnosis not present

## 2016-12-05 DIAGNOSIS — Z96651 Presence of right artificial knee joint: Secondary | ICD-10-CM | POA: Diagnosis present

## 2016-12-05 DIAGNOSIS — J8410 Pulmonary fibrosis, unspecified: Secondary | ICD-10-CM | POA: Diagnosis present

## 2016-12-05 DIAGNOSIS — Z79890 Hormone replacement therapy (postmenopausal): Secondary | ICD-10-CM | POA: Diagnosis not present

## 2016-12-05 DIAGNOSIS — E785 Hyperlipidemia, unspecified: Secondary | ICD-10-CM | POA: Diagnosis present

## 2016-12-05 DIAGNOSIS — Z880 Allergy status to penicillin: Secondary | ICD-10-CM | POA: Diagnosis not present

## 2016-12-05 DIAGNOSIS — J9621 Acute and chronic respiratory failure with hypoxia: Secondary | ICD-10-CM | POA: Diagnosis not present

## 2016-12-05 DIAGNOSIS — Z7984 Long term (current) use of oral hypoglycemic drugs: Secondary | ICD-10-CM | POA: Diagnosis not present

## 2016-12-05 DIAGNOSIS — E118 Type 2 diabetes mellitus with unspecified complications: Secondary | ICD-10-CM | POA: Diagnosis not present

## 2016-12-05 DIAGNOSIS — E119 Type 2 diabetes mellitus without complications: Secondary | ICD-10-CM | POA: Diagnosis present

## 2016-12-05 DIAGNOSIS — Z9981 Dependence on supplemental oxygen: Secondary | ICD-10-CM | POA: Diagnosis not present

## 2016-12-05 DIAGNOSIS — D72829 Elevated white blood cell count, unspecified: Secondary | ICD-10-CM | POA: Diagnosis present

## 2016-12-05 DIAGNOSIS — Z7952 Long term (current) use of systemic steroids: Secondary | ICD-10-CM | POA: Diagnosis not present

## 2016-12-05 DIAGNOSIS — J9601 Acute respiratory failure with hypoxia: Secondary | ICD-10-CM | POA: Diagnosis not present

## 2016-12-05 DIAGNOSIS — I10 Essential (primary) hypertension: Secondary | ICD-10-CM | POA: Diagnosis not present

## 2016-12-05 DIAGNOSIS — R0602 Shortness of breath: Secondary | ICD-10-CM | POA: Diagnosis not present

## 2016-12-05 DIAGNOSIS — J84112 Idiopathic pulmonary fibrosis: Secondary | ICD-10-CM | POA: Diagnosis not present

## 2016-12-05 DIAGNOSIS — K219 Gastro-esophageal reflux disease without esophagitis: Secondary | ICD-10-CM | POA: Diagnosis present

## 2016-12-05 DIAGNOSIS — J849 Interstitial pulmonary disease, unspecified: Secondary | ICD-10-CM

## 2016-12-05 DIAGNOSIS — Z515 Encounter for palliative care: Secondary | ICD-10-CM | POA: Diagnosis not present

## 2016-12-05 DIAGNOSIS — Z800 Family history of malignant neoplasm of digestive organs: Secondary | ICD-10-CM | POA: Diagnosis not present

## 2016-12-05 DIAGNOSIS — Z90710 Acquired absence of both cervix and uterus: Secondary | ICD-10-CM | POA: Diagnosis not present

## 2016-12-05 DIAGNOSIS — R51 Headache: Secondary | ICD-10-CM | POA: Diagnosis not present

## 2016-12-05 DIAGNOSIS — Z8249 Family history of ischemic heart disease and other diseases of the circulatory system: Secondary | ICD-10-CM | POA: Diagnosis not present

## 2016-12-05 DIAGNOSIS — T380X5A Adverse effect of glucocorticoids and synthetic analogues, initial encounter: Secondary | ICD-10-CM | POA: Diagnosis present

## 2016-12-05 DIAGNOSIS — Z7982 Long term (current) use of aspirin: Secondary | ICD-10-CM | POA: Diagnosis not present

## 2016-12-05 DIAGNOSIS — Z7189 Other specified counseling: Secondary | ICD-10-CM | POA: Diagnosis not present

## 2016-12-05 DIAGNOSIS — Z885 Allergy status to narcotic agent status: Secondary | ICD-10-CM | POA: Diagnosis not present

## 2016-12-05 DIAGNOSIS — E039 Hypothyroidism, unspecified: Secondary | ICD-10-CM | POA: Diagnosis present

## 2016-12-05 LAB — GLUCOSE, CAPILLARY
GLUCOSE-CAPILLARY: 155 mg/dL — AB (ref 65–99)
GLUCOSE-CAPILLARY: 231 mg/dL — AB (ref 65–99)
Glucose-Capillary: 129 mg/dL — ABNORMAL HIGH (ref 65–99)
Glucose-Capillary: 165 mg/dL — ABNORMAL HIGH (ref 65–99)

## 2016-12-05 LAB — RESPIRATORY PANEL BY PCR
Adenovirus: NOT DETECTED
BORDETELLA PERTUSSIS-RVPCR: NOT DETECTED
CORONAVIRUS 229E-RVPPCR: NOT DETECTED
Chlamydophila pneumoniae: NOT DETECTED
Coronavirus HKU1: NOT DETECTED
Coronavirus NL63: NOT DETECTED
Coronavirus OC43: NOT DETECTED
INFLUENZA B-RVPPCR: NOT DETECTED
Influenza A: NOT DETECTED
Metapneumovirus: NOT DETECTED
Mycoplasma pneumoniae: NOT DETECTED
Parainfluenza Virus 1: NOT DETECTED
Parainfluenza Virus 2: NOT DETECTED
Parainfluenza Virus 3: NOT DETECTED
Parainfluenza Virus 4: NOT DETECTED
RESPIRATORY SYNCYTIAL VIRUS-RVPPCR: NOT DETECTED
Rhinovirus / Enterovirus: NOT DETECTED

## 2016-12-05 LAB — BLOOD GAS, ARTERIAL
ACID-BASE EXCESS: 2.5 mmol/L — AB (ref 0.0–2.0)
BICARBONATE: 27.1 mmol/L (ref 20.0–28.0)
DRAWN BY: 331761
O2 CONTENT: 8 L/min
O2 SAT: 98.7 %
PATIENT TEMPERATURE: 98.1
pCO2 arterial: 45.5 mmHg (ref 32.0–48.0)
pH, Arterial: 7.39 (ref 7.350–7.450)
pO2, Arterial: 140 mmHg — ABNORMAL HIGH (ref 83.0–108.0)

## 2016-12-05 LAB — BASIC METABOLIC PANEL
ANION GAP: 12 (ref 5–15)
BUN: 13 mg/dL (ref 6–20)
CALCIUM: 8.8 mg/dL — AB (ref 8.9–10.3)
CO2: 26 mmol/L (ref 22–32)
Chloride: 98 mmol/L — ABNORMAL LOW (ref 101–111)
Creatinine, Ser: 1.02 mg/dL — ABNORMAL HIGH (ref 0.44–1.00)
GFR, EST NON AFRICAN AMERICAN: 52 mL/min — AB (ref >60)
Glucose, Bld: 206 mg/dL — ABNORMAL HIGH (ref 65–99)
Potassium: 4.1 mmol/L (ref 3.5–5.1)
SODIUM: 136 mmol/L (ref 135–145)

## 2016-12-05 LAB — CBC
HEMATOCRIT: 35.4 % — AB (ref 36.0–46.0)
Hemoglobin: 10.9 g/dL — ABNORMAL LOW (ref 12.0–15.0)
MCH: 24.1 pg — ABNORMAL LOW (ref 26.0–34.0)
MCHC: 30.8 g/dL (ref 30.0–36.0)
MCV: 78.1 fL (ref 78.0–100.0)
Platelets: 366 10*3/uL (ref 150–400)
RBC: 4.53 MIL/uL (ref 3.87–5.11)
RDW: 15.5 % (ref 11.5–15.5)
WBC: 12.2 10*3/uL — ABNORMAL HIGH (ref 4.0–10.5)

## 2016-12-05 LAB — BRAIN NATRIURETIC PEPTIDE: B Natriuretic Peptide: 30.3 pg/mL (ref 0.0–100.0)

## 2016-12-05 LAB — MRSA PCR SCREENING: MRSA by PCR: NEGATIVE

## 2016-12-05 MED ORDER — INFLUENZA VAC SPLIT HIGH-DOSE 0.5 ML IM SUSY
0.5000 mL | PREFILLED_SYRINGE | INTRAMUSCULAR | Status: DC
  Filled 2016-12-05: qty 0.5

## 2016-12-05 MED ORDER — FUROSEMIDE 10 MG/ML IJ SOLN
INTRAMUSCULAR | Status: AC
  Administered 2016-12-05: 60 mg via INTRAVENOUS
  Filled 2016-12-05: qty 4

## 2016-12-05 MED ORDER — LEVALBUTEROL HCL 0.63 MG/3ML IN NEBU
0.6300 mg | INHALATION_SOLUTION | Freq: Four times a day (QID) | RESPIRATORY_TRACT | Status: DC | PRN
  Administered 2016-12-05: 0.63 mg via RESPIRATORY_TRACT
  Filled 2016-12-05: qty 3

## 2016-12-05 MED ORDER — FUROSEMIDE 10 MG/ML IJ SOLN
60.0000 mg | Freq: Once | INTRAMUSCULAR | Status: AC
  Administered 2016-12-05: 60 mg via INTRAVENOUS

## 2016-12-05 MED ORDER — METHYLPREDNISOLONE SODIUM SUCC 125 MG IJ SOLR
60.0000 mg | Freq: Four times a day (QID) | INTRAMUSCULAR | Status: DC
  Administered 2016-12-05 – 2016-12-08 (×11): 60 mg via INTRAVENOUS
  Filled 2016-12-05 (×11): qty 2

## 2016-12-05 MED ORDER — HYDROCOD POLST-CPM POLST ER 10-8 MG/5ML PO SUER
5.0000 mL | Freq: Two times a day (BID) | ORAL | Status: DC | PRN
Start: 2016-12-05 — End: 2016-12-06
  Administered 2016-12-05: 5 mL via ORAL
  Filled 2016-12-05: qty 5

## 2016-12-05 MED ORDER — FUROSEMIDE 10 MG/ML IJ SOLN
INTRAMUSCULAR | Status: AC
  Administered 2016-12-05: 20 mg
  Filled 2016-12-05: qty 2

## 2016-12-05 MED ORDER — ACETAMINOPHEN 325 MG PO TABS
650.0000 mg | ORAL_TABLET | Freq: Four times a day (QID) | ORAL | Status: DC | PRN
  Administered 2016-12-05: 650 mg via ORAL

## 2016-12-05 MED ORDER — LEVALBUTEROL HCL 0.63 MG/3ML IN NEBU
0.6300 mg | INHALATION_SOLUTION | Freq: Three times a day (TID) | RESPIRATORY_TRACT | Status: DC
  Administered 2016-12-05 – 2016-12-09 (×12): 0.63 mg via RESPIRATORY_TRACT
  Filled 2016-12-05 (×12): qty 3

## 2016-12-05 MED ORDER — LEVALBUTEROL HCL 0.63 MG/3ML IN NEBU: 0.6300 mg | INHALATION_SOLUTION | Freq: Four times a day (QID) | RESPIRATORY_TRACT | Status: DC

## 2016-12-05 MED ORDER — IPRATROPIUM BROMIDE 0.02 % IN SOLN: 0.5000 mg | RESPIRATORY_TRACT | Status: DC

## 2016-12-05 MED ORDER — HYDROCOD POLST-CPM POLST ER 10-8 MG/5ML PO SUER
5.0000 mL | Freq: Once | ORAL | Status: AC
  Administered 2016-12-05: 5 mL via ORAL
  Filled 2016-12-05: qty 5

## 2016-12-05 MED ORDER — LEVALBUTEROL HCL 1.25 MG/0.5ML IN NEBU
1.2500 mg | INHALATION_SOLUTION | Freq: Once | RESPIRATORY_TRACT | Status: AC
  Administered 2016-12-05: 1.25 mg via RESPIRATORY_TRACT
  Filled 2016-12-05: qty 0.5

## 2016-12-05 MED ORDER — IPRATROPIUM BROMIDE 0.02 % IN SOLN
0.5000 mg | Freq: Three times a day (TID) | RESPIRATORY_TRACT | Status: DC
  Administered 2016-12-05 – 2016-12-09 (×12): 0.5 mg via RESPIRATORY_TRACT
  Filled 2016-12-05 (×12): qty 2.5

## 2016-12-05 NOTE — Progress Notes (Signed)
Patient having severe cough  And complains of headache with cough. Oxygen saturation in the 80's whenever patient coughs.Text paged NP K. Wellsite geologist. Awaiting response

## 2016-12-05 NOTE — Significant Event (Signed)
Rapid Response Event Note  Overview: Time Called: 1055 Arrival Time: 1100 Event Type: Respiratory  Initial Focused Assessment: Patient with increased WOB and SOB with movement or coughing.  Her oxygen sats also decreased to 76% Lung sounds crackles through out. BP 165/72  SR 90s  RR 32-40  O2 sats 96% on 6L [place] Patient is alert and oriented  Interventions: ABG done PCXR done Dr Ronalee Belts at bedside to assess patient Rahul PA at bedside to assess patient   Lasix given IV  Patient repeatedly desats into the 70s when active or coughing.  Improve after a couple minutes of rest.  Transfer to 2c02 via bed with heart monitor and O2  Plan of Care (if not transferred):  Event Summary: Name of Physician Notified: Bhandari at 1055    at    Outcome: Transferred (Comment) [phone])  Event End Time: 1240  Marcellina Millin

## 2016-12-05 NOTE — Progress Notes (Signed)
Patient oxygen saturation dropped to 70's whiles on 4L of O2. Patient placed on non rebreather mask for approximately 10 minutes. Patient Oxygen sat increased to 90%. Patient then placed on nasal cannula at 5L of Oxygen. Will titrate down

## 2016-12-05 NOTE — Progress Notes (Signed)
Patient Oxygen sat dropped to 77% on 5L whiles patient was been ambulated to bathroom. Oxygen sat increased to 83% and patient started complaining of SOB whiles sitting on the toilet. Patient was placed on 100% non rebreather and respiratory called for breathing treatment.

## 2016-12-05 NOTE — Progress Notes (Signed)
Report given to RN on 2C. Will transfer patient.

## 2016-12-05 NOTE — Progress Notes (Signed)
Patient requesting for tylenol for right knee pain. Text paged NP K. Wellsite geologist. Awaiting response.

## 2016-12-05 NOTE — Consult Note (Signed)
Name: Kim [name] MRN: [id number] DOB: 1940-07-16    ADMISSION DATE:  12/04/2016 CONSULTATION DATE:  10/11  REFERRING MD :  Ronalee Belts (triad)   CHIEF COMPLAINT:  Hypoxia, ILD   BRIEF PATIENT DESCRIPTION: 76yo female with hx ILD from remote ALI followed by Dr. Sherene Sires on  prednisone daily, HTN, GERD, DM, chronic hypoxic respiratory failure on home O2 (2L at rest, 6L with exertion) presented 10/10 with SOB, nonproductive cough and increased O2 needs.  She was admitted by [facility] with acute on chronic respiratory failure thought r/t IDL flare and treated with nebs and IV steroids.  She continued to have increased O2 demands and PCCM consulted 10/11.   SIGNIFICANT EVENTS    STUDIES:  RVP 10/10>>> neg    HISTORY OF PRESENT ILLNESS: 76yo female with hx ILD followed by Dr. Sherene Sires, HTN, GERD, DM, chronic hypoxic respiratory failure on home O2 (2L at rest, 6L with exertion) presented 10/10 with SOB, productive cough and increased O2 needs.  She was admitted by [facility] with acute on chronic respiratory failure thought r/t IDL flare and treated with nebs and IV steroids.  She continued to have increased O2 demands and PCCM consulted 10/11.   Currently c/o productive cough, DOE.   Denies chest pain, fever, hemoptysis, purulent sputum, orthopnea, BLE edema.   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  has a past surgical history that includes Abdominal hysterectomy; Small intestine surgery; Hernia repair; Colonoscopy; and Total knee arthroplasty (Right, 07/06/2014). Prior to Admission medications   Medication Sig Start Date End Date  Taking? Authorizing Provider  acetaminophen (TYLENOL) 500 MG tablet Take 500 mg by mouth every 8 (eight) hours as needed (for pain or headaches). Use per bottle as needed.   Yes [provider]  aspirin EC 81 MG tablet Take 81 mg by mouth daily.   Yes [provider]  benzonatate (TESSALON) 200 MG capsule Take 200 mg by mouth every 8 (eight) hours as needed for cough.   Yes [provider]  famotidine (PEPCID) 20 MG tablet TAKE 1 TABLET (20 MG TOTAL) BY MOUTH AT BEDTIME. 07/04/16  Yes Nyoka Cowden, MD  levothyroxine (SYNTHROID, LEVOTHROID) 75 MCG tablet Take 75 mcg by mouth daily before breakfast.   Yes [provider]  losartan-hydrochlorothiazide (HYZAAR) 100-12.5 MG per tablet Take 1 tablet by mouth daily.   Yes [provider]  metFORMIN (GLUCOPHAGE) 500 MG tablet TAKE 1 TABLET BY MOUTH TWICE A DAY Patient taking differently: Take 500 mg by mouth two times a day 03/31/13  Yes Worthy Rancher B, FNP  OXYGEN Inhale 2-6 L into the lungs See admin instructions. 2 liters at rest and 6 liters with activity   Yes [provider]  predniSONE (DELTASONE) 10 MG tablet TAKE 1 & 1/2 TABLET EVERY DAY WITH BREAKFAST 10/14/16  Yes Nyoka Cowden, MD  pantoprazole (PROTONIX) 40 MG tablet Take 1 tablet (40 mg total) by mouth daily. Take 30-60 min before first meal of the day Patient not taking: Reported on 12/04/2016 08/22/16   Nyoka Cowden, MD   Allergies  Allergen Reactions  . Codeine Nausea And Vomiting and Other (See Comments)    "Takes my appetite" also  . Penicillins Rash  Has patient had a PCN reaction causing immediate rash, facial/tongue/throat swelling, SOB or lightheadedness with hypotension: Yes Has patient had a PCN reaction causing severe rash involving mucus membranes or skin necrosis: No Has patient had a PCN reaction that required hospitalization No Has patient had a PCN reaction occurring within the last 10 years: No If all of the  above answers are "NO", then may proceed with Cephalosporin use.     FAMILY HISTORY:  family history includes Heart attack in her sister; Stomach cancer in her sister. SOCIAL HISTORY:  reports that she quit smoking about 28 years ago. Her smoking use included Cigarettes. She has a 5.00 pack-year smoking history. She has never used smokeless tobacco. She reports that she does not drink alcohol or use drugs.  REVIEW OF SYSTEMS:   As per HPI - All other systems reviewed and were neg.    SUBJECTIVE:   VITAL SIGNS: Temp:  [97.5 F (36.4 C)-98.2 F (36.8 C)] 98.1 F (36.7 C) (10/11 0953) Pulse Rate:  [73-122] 73 (10/11 0953) Resp:  [18-36] 20 (10/11 0953) BP: (110-169)/(56-151) 143/63 (10/11 0953) SpO2:  [92 %-100 %] 92 % (10/11 1108) FiO2 (%):  [100 %] 100 % (10/11 0716) Weight:  [80.7 kg (177 lb 14.6 oz)] 80.7 kg (177 lb 14.6 oz) (10/11 0454)  PHYSICAL EXAMINATION: General:  Adult female, resting comfortably, NAD  Neuro:  Awake, alert, appropriate HEENT:  Mm moist, mild JVD  Cardiovascular:  s1s2 rrr Lungs:  resps even non labored, crackles throughout  Abdomen:  Round, soft, non tender  Musculoskeletal:  Warm and dry, no sig edema    Recent Labs Lab 12/04/16 1210 12/05/16 0227  NA 139 136  K 3.4* 4.1  CL 98* 98*  CO2 27 26  BUN 11 13  CREATININE 0.98 1.02*  GLUCOSE 103* 206*    Recent Labs Lab 12/04/16 1210 12/05/16 0227  HGB 11.3* 10.9*  HCT 37.7 35.4*  WBC 18.2* 12.2*  PLT 325 366   Dg Chest Port 1 View  Result Date: 12/05/2016 CLINICAL DATA:  Acute respiratory distress. EXAM: PORTABLE CHEST 1 VIEW COMPARISON:  Chest x-rays dated 12/04/2016 and 09/30/2016. FINDINGS: Heart size and mediastinal contours are stable. The chronic interstitial lung disease/fibrosis is stable in appearance. No new airspace opacity to suggest a developing pneumonia. No evidence of superimposed pulmonary edema. No pleural effusion or pneumothorax seen. No acute or suspicious osseous  finding. IMPRESSION: No active disease. Severe chronic interstitial lung disease/fibrosis. No evidence of superimposed pneumonia or pulmonary edema. Electronically Signed   By: Bary Richard M.D.   On: 12/05/2016 11:33   Dg Chest Portable 1 View  Result Date: 12/04/2016 CLINICAL DATA:  Severe shortness of breath and cough. EXAM: PORTABLE CHEST 1 VIEW COMPARISON:  09/30/2016 FINDINGS: The cardiac silhouette, mediastinal and hilar contours are within normal limits and stable. There is tortuosity and calcification of the thoracic aorta. Stable severe interstitial lung disease with pulmonary fibrosis and volume loss. No definite acute overlying pulmonary process is identified. The bony thorax is intact. IMPRESSION: Severe chronic lung disease without definite acute overlying pulmonary process. Electronically Signed   By: Rudie Meyer M.D.   On: 12/04/2016 11:33    ASSESSMENT / PLAN:  Acute on chronic hypoxic respiratory failure - likely r/t ILD flare. No acute change on CXR. Afebrile. No purulent sputum.  RVP neg.  ILD   PLAN -  bipap PRN - doubt she will need  Lasix  IV x 1 now  F/u BNP  Continue IV steroids - maintenance prednisone  Supplemental O2 as needed -- baseline is 2L  and 6L with exertion  Goal sat >88%  Pulmonary hygiene  Cough suppression PRN duonebs    Dirk Dress, NP 12/05/2016  11:42 AM Pager: [phone] or [phone]

## 2016-12-05 NOTE — Progress Notes (Signed)
Patient having dry cough. Text paged NP K. Wellsite geologist. Awaiting response.

## 2016-12-05 NOTE — Progress Notes (Signed)
Responded to [facility] Consult to assist pt. With preparing AD. AD Completed. Original mgiven to pt., two copies for agents and one to nurse for pt. Chart. Chaplain available as needed.   12/05/16 1153  Clinical Encounter Type  Visited With Patient;Health care provider  Visit Type Initial  Referral From Nurse  Spiritual Encounters  Spiritual Needs Literature;Emotional  Stress Factors  Patient Stress Factors None identified  Advance Directives (For Healthcare)  Does Patient Have a Medical Advance Directive? Yes  Type of Estate agent of [place];Living will  Copy of Healthcare Power of Attorney in Chart? Yes  Copy of Living Will in Chart? Yes  Fae Pippin, [facility], Pager [phone]

## 2016-12-05 NOTE — Progress Notes (Signed)
Patient saturation currently 98% on 4L of oxygen and resting comfortable.

## 2016-12-05 NOTE — Progress Notes (Signed)
Patients oxygen saturation went down to 77%. Moved oxygen up to 5 L nasal cannula.  Oxygen saturation back up to 96% on 5 liters. MD notified. Rapid response notified. Will continue to monitor.

## 2016-12-05 NOTE — Progress Notes (Signed)
Patients oxygen saturation was 72% on 5 liters and heart rate 123. Increased oxygen saturation to 6 liters and oxygen went up to between 88-92% and pulse 103. MD, Rapid Response RN and Respiratory notified and at bedside. MD stated to give breathing treatment and place on BiPAP PRN. Breathing treatment administered. MD also ordered for CXR to be completed stat and transfer to step down unit. Orders followed. Will continue to monitor.

## 2016-12-05 NOTE — Progress Notes (Signed)
PROGRESS NOTE    Kim [name]  ZOX:[id number] DOB: October 13, 1940 DOA: 12/04/2016 PCP: Renaye Rakers, MD   Brief Narrative: 76 y.o. female with medical history significant of ILD, chronic hypoxia on 2 L of oxygen at home during the rest and up to 6 L with ambulation, HTN, HLD, GERD, DM, sent by her PCP for acute hypoxia with shortness of breath. As per H&P, patient had oxygen saturation of 60s in triage on 2 L. She required nonrebreather and up to 6 L of oxygen. Admitted for further evaluation.  Assessment & Plan:   # Acute on chronic respiratory failure with hypoxia/h/o ILD: Chest x-ray with known interstitial lung disease with no acute finding. D-dimer not elevated. Patient reported cough with clear phlegm. She has wheezing and basal crackles. -Rapid response was called this morning because of increasing requirement of oxygen and shortness of breath. -Repeat a stat x-ray, ABG, BiPAP as needed. Patient is currently on 6 L of oxygen. -Increased the dose of Solu-Medrol every 6 hourly. -Continue Atrovent, Xopenex nebulization. Discussed with the respiratory team. -Pulmonary critical care consult requested and discussed with Dr. Isaiah Serge.  #Diabetes: Continue sliding scale. Monitor blood sugar level especially while on steroid  #Hypertension: Blood pressure acceptable. Continue HCTZ, losartan.  #Hypothyroidism: Continue Synthroid.  #GERD continue Pepcid  DVT prophylaxis: Heparin subcutaneous Code Status: Full code Family Communication: No family at bedside Disposition Plan: Transfer to stepdown unit for close monitoring and possible need of BiPAP.    Consultants:   Pulmonary critical care  Procedures: None Antimicrobials: None  Subjective: Seen and examined at bedside. She has shortness of breath, cough with clear phlegm and mild headache. Denied nausea vomiting, chest pain, abdominal pain.  Objective: Vitals:   12/05/16 0127 12/05/16 0454 12/05/16 0716 12/05/16 0953  BP:   129/70  (!) 143/63  Pulse:  98  73  Resp:  (!) 21  20  Temp:  98.2 F (36.8 C)  98.1 F (36.7 C)  TempSrc:  Oral  Oral  SpO2: 98% 98% 100% 97%  Weight:  80.7 kg (177 lb 14.6 oz)    Height:   (1.753 m)      Intake/Output Summary (Last 24 hours) at 12/05/16 1104 Last data filed at 12/05/16 8119  Gross per 24 hour  Intake                0 ml  Output                1 ml  Net               -1 ml   Filed Weights   12/04/16 1134 12/05/16 0454  Weight: 80.7 kg (178 lb) 80.7 kg (177 lb 14.6 oz)    Examination:  General exam: Sitting in bed with mildly increased work of breathing Respiratory system: Bibasal crackles with diffuse wheeze, mildly increased work of breathing Cardiovascular system: S1 & S2 heard, regular tachycardic.  No pedal edema. Gastrointestinal system: Abdomen is nondistended, soft and nontender. Normal bowel sounds heard. Central nervous system: Alert and oriented. No focal neurological deficits. Extremities: Symmetric 5 x 5 power. Skin: No rashes, lesions or ulcers Psychiatry: Judgement and insight appear normal. Mood & affect appropriate.     Data Reviewed: I have personally reviewed following labs and imaging studies  CBC:  Recent Labs Lab 12/04/16 1210 12/05/16 0227  WBC 18.2* 12.2*  NEUTROABS 9.5*  --   HGB 11.3* 10.9*  HCT 37.7 35.4*  MCV 77.9* 78.1  PLT 325 366   Basic Metabolic Panel:  Recent Labs Lab 12/04/16 1210 12/05/16 0227  NA 139 136  K 3.4* 4.1  CL 98* 98*  CO2 27 26  GLUCOSE 103* 206*  BUN 11 13  CREATININE 0.98 1.02*  CALCIUM 8.9 8.8*   GFR: Estimated Creatinine Clearance: 54.2 mL/min (A) (by C-G formula based on SCr of 1.02 mg/dL (H)). Liver Function Tests:  Recent Labs Lab 12/04/16 1210  AST 23  ALT 11*  ALKPHOS 58  BILITOT 0.5  PROT 6.8  ALBUMIN 3.2*   No results for input(s): LIPASE, AMYLASE in the last 168 hours. No results for input(s): AMMONIA in the last 168 hours. Coagulation Profile: No  results for input(s): INR, PROTIME in the last 168 hours. Cardiac Enzymes: No results for input(s): CKTOTAL, CKMB, CKMBINDEX, TROPONINI in the last 168 hours. BNP (last 3 results)  Recent Labs  03/01/16 1537  PROBNP 31.0   HbA1C: No results for input(s): HGBA1C in the last 72 hours. CBG:  Recent Labs Lab 12/04/16 1715 12/04/16 2039 12/05/16 0818  GLUCAP 161* 184* 155*   Lipid Profile: No results for input(s): CHOL, HDL, LDLCALC, TRIG, CHOLHDL, LDLDIRECT in the last 72 hours. Thyroid Function Tests: No results for input(s): TSH, T4TOTAL, FREET4, T3FREE, THYROIDAB in the last 72 hours. Anemia Panel: No results for input(s): VITAMINB12, FOLATE, FERRITIN, TIBC, IRON, RETICCTPCT in the last 72 hours. Sepsis Labs: No results for input(s): PROCALCITON, LATICACIDVEN in the last 168 hours.  No results found for this or any previous visit (from the past 240 hour(s)).       Radiology Studies: Dg Chest Portable 1 View  Result Date: 12/04/2016 CLINICAL DATA:  Severe shortness of breath and cough. EXAM: PORTABLE CHEST 1 VIEW COMPARISON:  09/30/2016 FINDINGS: The cardiac silhouette, mediastinal and hilar contours are within normal limits and stable. There is tortuosity and calcification of the thoracic aorta. Stable severe interstitial lung disease with pulmonary fibrosis and volume loss. No definite acute overlying pulmonary process is identified. The bony thorax is intact. IMPRESSION: Severe chronic lung disease without definite acute overlying pulmonary process. Electronically Signed   By: Rudie Meyer M.D.   On: 12/04/2016 11:33        Scheduled Meds: . aspirin EC  81 mg Oral Daily  . famotidine  20 mg Oral QHS  . feeding supplement (ENSURE ENLIVE)  237 mL Oral BID BM  . guaiFENesin  1,200 mg Oral BID  . heparin  5,000 Units Subcutaneous Q8H  . hydrochlorothiazide  12.5 mg Oral Daily  . insulin aspart  0-5 Units Subcutaneous QHS  . insulin aspart  0-9 Units Subcutaneous  TID WC  . ipratropium  0.5 mg Nebulization TID  . levalbuterol  0.63 mg Nebulization TID  . levothyroxine  75 mcg Oral QAC breakfast  . losartan  100 mg Oral Daily  . methylPREDNISolone (SOLU-MEDROL) injection  60 mg Intravenous Q6H   Continuous Infusions:   LOS: 0 days    Maanya Hippert Jaynie Collins, MD [facility] Pager [phone]  If 7PM-7AM, please contact night-coverage [email or web address] Password [email or web address] 12/05/2016, 11:04 AM

## 2016-12-06 DIAGNOSIS — J9621 Acute and chronic respiratory failure with hypoxia: Secondary | ICD-10-CM

## 2016-12-06 DIAGNOSIS — Z7189 Other specified counseling: Secondary | ICD-10-CM

## 2016-12-06 DIAGNOSIS — R0602 Shortness of breath: Secondary | ICD-10-CM

## 2016-12-06 DIAGNOSIS — Z515 Encounter for palliative care: Secondary | ICD-10-CM

## 2016-12-06 LAB — BASIC METABOLIC PANEL
Anion gap: 13 (ref 5–15)
BUN: 24 mg/dL — ABNORMAL HIGH (ref 6–20)
CALCIUM: 8.7 mg/dL — AB (ref 8.9–10.3)
CO2: 26 mmol/L (ref 22–32)
CREATININE: 1.19 mg/dL — AB (ref 0.44–1.00)
Chloride: 95 mmol/L — ABNORMAL LOW (ref 101–111)
GFR calc Af Amer: 50 mL/min — ABNORMAL LOW (ref >60)
GFR, EST NON AFRICAN AMERICAN: 44 mL/min — AB (ref >60)
Glucose, Bld: 223 mg/dL — ABNORMAL HIGH (ref 65–99)
Potassium: 3.9 mmol/L (ref 3.5–5.1)
Sodium: 134 mmol/L — ABNORMAL LOW (ref 135–145)

## 2016-12-06 LAB — GLUCOSE, CAPILLARY
GLUCOSE-CAPILLARY: 224 mg/dL — AB (ref 65–99)
GLUCOSE-CAPILLARY: 174 mg/dL — AB (ref 65–99)
Glucose-Capillary: 170 mg/dL — ABNORMAL HIGH (ref 65–99)
Glucose-Capillary: 224 mg/dL — ABNORMAL HIGH (ref 65–99)

## 2016-12-06 LAB — CBC
HEMATOCRIT: 35.7 % — AB (ref 36.0–46.0)
Hemoglobin: 11 g/dL — ABNORMAL LOW (ref 12.0–15.0)
MCH: 23.7 pg — ABNORMAL LOW (ref 26.0–34.0)
MCHC: 30.8 g/dL (ref 30.0–36.0)
MCV: 76.9 fL — AB (ref 78.0–100.0)
PLATELETS: 373 10*3/uL (ref 150–400)
RBC: 4.64 MIL/uL (ref 3.87–5.11)
RDW: 15.2 % (ref 11.5–15.5)
WBC: 21.6 10*3/uL — ABNORMAL HIGH (ref 4.0–10.5)

## 2016-12-06 MED ORDER — BENZONATATE 100 MG PO CAPS
100.0000 mg | ORAL_CAPSULE | Freq: Two times a day (BID) | ORAL | Status: DC
  Administered 2016-12-06 (×2): 100 mg via ORAL
  Filled 2016-12-06 (×3): qty 1

## 2016-12-06 MED ORDER — MORPHINE SULFATE (CONCENTRATE) 10 MG/0.5ML PO SOLN: 5.0000 mg | ORAL | Status: DC | PRN

## 2016-12-06 MED ORDER — ENOXAPARIN SODIUM 40 MG/0.4ML ~~LOC~~ SOLN
40.0000 mg | SUBCUTANEOUS | Status: DC
  Administered 2016-12-06 – 2016-12-08 (×3): 40 mg via SUBCUTANEOUS
  Filled 2016-12-06 (×3): qty 0.4

## 2016-12-06 NOTE — Progress Notes (Signed)
Nutrition Brief Note  Patient identified on the Malnutrition Screening Tool (MST) Report  Wt Readings from Last 15 Encounters:  12/05/16 194 lb 4.8 oz (88.1 kg)  10/22/16 178 lb (80.7 kg)  09/30/16 180 lb (81.6 kg)  08/19/16 176 lb 12.8 oz (80.2 kg)  06/18/16 168 lb 9.6 oz (76.5 kg)  05/21/16 152 lb (68.9 kg)  04/09/16 159 lb 9.6 oz (72.4 kg)  03/01/16 150 lb (68 kg)  02/08/16 147 lb 4.3 oz (66.8 kg)  01/30/16 142 lb 9.6 oz (64.7 kg)  01/28/16 141 lb 4.8 oz (64.1 kg)  09/21/15 133 lb (60.3 kg)  10/03/14 142 lb (64.4 kg)  07/06/14 158 lb (71.7 kg)  06/23/14 158 lb 8.2 oz (71.9 kg)   Kim [name] is a 76 y.o. female with medical history significant of ILD, HTN, HLD, GERD, DM. Patient presents from her primary care physician's office and acute respiratory failure  Hx obtained from pt and family members at bedside. Pt has a great appetite and consumes 3 meals per day (Breakfast: grits and eggs, Lunch: spaghetti, and Dinner: leftovers). She ate all of her breakfast this morning.   Pt reports she lost a significant amount of weight (150#) over 3 years ago due to a medication she was given after knee surgery "that completely took away my appetite". Pt shares that she was given an appetite stimulant after significant weight loss. Per chart review and confirmation from pt and family, pt's weight has steadily increased over the past 2 years.   Nutrition-Focused physical exam completed. Findings are no fat depletion, no muscle depletion, and mild edema.   Body mass index is 28.69 kg/m. Patient meets criteria for overweight based on current BMI.   Current diet order is Carb modified, patient is consuming approximately 100% of meals at this time. Labs and medications reviewed.   No nutrition interventions warranted at this time. If nutrition issues arise, please consult RD.   Kim [name] A. Mayford Knife, RD, LDN, CDE Pager: [phone] After hours Pager: [phone]

## 2016-12-06 NOTE — Care Management Note (Signed)
Case Management Note  Patient Details  Name: Kim [name] MRN: [id number] Date of Birth: June 22, 1940  Subjective/Objective:       Pt admitted with hypoxia and chronic ILD             Action/Plan:  PTA independent from home with daughter.  Pt states she uses a walker in the home and has a wheelchair available if needed.  Pt is on home oxygen 2-6 liters dependent upon exertion supplied by [facility].  Family will bring portable tank to [facility] for transport home.  Palliative Care Consult placed per chronic disease with progression.  CM will continue to follow for discharge needs   Expected Discharge Date:                  Expected Discharge Plan:  Home/Self Care  In-House Referral:     Discharge planning Services  CM Consult  Post Acute Care Choice:    Choice offered to:  Patient  DME Arranged:  Oxygen DME Agency:  [facility]  HH Arranged:    [facility] Agency:     Status of Service:  In process, will continue to follow  If discussed at Long Length of Stay Meetings, dates discussed:    Additional Comments:  Cherylann Parr, RN 12/06/2016, 11:38 AM

## 2016-12-06 NOTE — Progress Notes (Signed)
Name: Kim [name] MRN: [id number] DOB: Nov 08, 1940    ADMISSION DATE:  12/04/2016 CONSULTATION DATE:  10/11  REFERRING MD :  Ronalee Belts ([facility])   CHIEF COMPLAINT:  Hypoxia, ILD   BRIEF PATIENT DESCRIPTION: 76yo female with hx ILD from remote ALI followed by Dr. Sherene Sires on  prednisone daily, HTN, GERD, DM, chronic hypoxic respiratory failure on home O2 (2L at rest, 6L with exertion) presented 10/10 with SOB, nonproductive cough and increased O2 needs.  She was admitted by [facility] with acute on chronic respiratory failure thought r/t IDL flare and treated with nebs and IV steroids.  She continued to have increased O2 demands and PCCM consulted 10/11.   SIGNIFICANT EVENTS    STUDIES:  RVP 10/10>>> neg    HISTORY OF PRESENT ILLNESS: 76yo female with hx ILD followed by Dr. Sherene Sires, HTN, GERD, DM, chronic hypoxic respiratory failure on home O2 (2L at rest, 6L with exertion) presented 10/10 with SOB, productive cough and increased O2 needs.  She was admitted by [facility] with acute on chronic respiratory failure thought r/t IDL flare and treated with nebs and IV steroids.  She continued to have increased O2 demands and PCCM consulted 10/11.   Currently c/o productive clear cough, no sob at rest       SUBJECTIVE:  Lying in bed in no distress.  VITAL SIGNS: Temp:  [97.5 F (36.4 C)-98.1 F (36.7 C)] 97.6 F (36.4 C) (10/12 0309) Pulse Rate:  [73-103] 77 (10/12 0309) Resp:  [20-33] 28 (10/12 0309) BP: (104-165)/(57-91) 127/57 (10/12 0309) SpO2:  [78 %-100 %] 100 % (10/12 0758) Weight:  [194 lb 4.8 oz (88.1 kg)] 194 lb 4.8 oz (88.1 kg) (10/11 1236)  PHYSICAL EXAMINATION: General:  Ill appearing female, in no distress lying in bed, O2 at 4 l/ [place] sats 100%. "I have a headache but breathing OK". Reports clear sputum HEENT: MM pink/moist, No Jvd.Lan PSY:Nl affect, pleasant Neuro: Intact CV: s1s2 rrr, no m/r/g PULM: even/non-labored, lungs bilaterally decreased air movement. No accessory  muscle use WU:JWJX, non-tender, bsx4 active  Extremities: warm/dry,-edema  Skin: no rashes or lesions    Recent Labs Lab 12/04/16 1210 12/05/16 0227 12/06/16 0249  NA 139 136 134*  K 3.4* 4.1 3.9  CL 98* 98* 95*  CO2 BUN 11 13 24*  CREATININE 0.98 1.02* 1.19*  GLUCOSE 103* 206* 223*    Recent Labs Lab 12/04/16 1210 12/05/16 0227 12/06/16 0249  HGB 11.3* 10.9* 11.0*  HCT 37.7 35.4* 35.7*  WBC 18.2* 12.2* 21.6*  PLT 325 366 373   Dg Chest Port 1 View  Result Date: 12/05/2016 CLINICAL DATA:  Acute respiratory distress. EXAM: PORTABLE CHEST 1 VIEW COMPARISON:  Chest x-rays dated 12/04/2016 and 09/30/2016. FINDINGS: Heart size and mediastinal contours are stable. The chronic interstitial lung disease/fibrosis is stable in appearance. No new airspace opacity to suggest a developing pneumonia. No evidence of superimposed pulmonary edema. No pleural effusion or pneumothorax seen. No acute or suspicious osseous finding. IMPRESSION: No active disease. Severe chronic interstitial lung disease/fibrosis. No evidence of superimposed pneumonia or pulmonary edema. Electronically Signed   By: Bary Richard M.D.   On: 12/05/2016 11:33   Dg Chest Portable 1 View  Result Date: 12/04/2016 CLINICAL DATA:  Severe shortness of breath and cough. EXAM: PORTABLE CHEST 1 VIEW COMPARISON:  09/30/2016 FINDINGS: The cardiac silhouette, mediastinal and hilar contours are within normal limits and stable. There is tortuosity and calcification of the thoracic aorta. Stable severe interstitial  lung disease with pulmonary fibrosis and volume loss. No definite acute overlying pulmonary process is identified. The bony thorax is intact. IMPRESSION: Severe chronic lung disease without definite acute overlying pulmonary process. Electronically Signed   By: Rudie Meyer M.D.   On: 12/04/2016 11:33  CXR. Afebrile. No purulent sputum.  RVP neg.  CT chest 10/11 with progression of reticular disease.    Intake/Output Summary (Last 24 hours) at 12/06/16 0835 Last data filed at 12/05/16 2300  Gross per 24 hour  Intake              720 ml  Output              300 ml  Net              420 ml   Filed Weights   12/04/16 1134 12/05/16 0454 12/05/16 1236  Weight: 178 lb (80.7 kg) 177 lb 14.6 oz (80.7 kg) 194 lb 4.8 oz (88.1 kg)   ASSESSMENT / PLAN:  Acute on chronic hypoxic respiratory failure - likely r/t ILD flare. No acute change on  ILD   PLAN -  bipap PRN - doubt she will need  Lasix  IV x 1 10/11 without negative I/o 10/12 F/u BNP 30 Continue IV steroids - maintenance prednisone  Supplemental O2 as needed -- baseline is 2L  and 6L with exertion , currently 4 l [place] with 100% sats at rest. Would decrease fio2  Goal sat >88%  Pulmonary hygiene  Cough suppression PRN duonebs She has been made DNR at her request No procal since she is chronically immunosuppressed     Brett Canales Talaysia Pinheiro ACNP [facility] PCC[facility] Pager [phone] till 3 pm If no answer page [phone] 12/06/2016, 8:34 AM

## 2016-12-06 NOTE — Progress Notes (Signed)
PROGRESS NOTE    Kim [name]  BJY:[id number] DOB: 29-Nov-1940 DOA: 12/04/2016 PCP: Renaye Rakers, MD   Brief Narrative: 76 y.o. female with medical history significant of ILD, chronic hypoxia on 2 L of oxygen at home during the rest and up to 6 L with ambulation, HTN, HLD, GERD, DM, sent by her PCP for acute hypoxia with shortness of breath. As per H&P, patient had oxygen saturation of 60s in triage on 2 L. She required nonrebreather and up to 6 L of oxygen. Admitted for further evaluation.  Assessment & Plan:   # Acute on chronic respiratory failure with hypoxia likely due to ILD flare: Chest x-ray with known interstitial lung disease with no acute finding. D-dimer not elevated.  -Currently on 4-5 L of oxygen. Continue IV Solu-Medrol and bronchodilators. Patient is to shortness of breath and difficulty completing sentences. Evaluated by pulmonologist, agreed with the current plan of care. Patient is now DO NOT RESUSCITATE. I consulted palliative care for the management of dyspnea and possible interstitial lung disease. -Continue Atrovent, Xopenex nebulization. Discussed with the respiratory team.  #Diabetes: Continue sliding scale. Monitor blood sugar level especially while on steroid  #Hypertension: Blood pressure acceptable. Continue HCTZ, losartan.  #Hypothyroidism: Continue Synthroid.  #GERD continue Pepcid  # Goals of care discussion: Patient is DNR/DNI and understand the severity of interest is interstitial lung disease. She does not want intubation or CPR. Consulted palliative care for the management of dyspnea. For now continue supportive care and current management. PT OT evaluation when patient is able to ambulate. Discussed with the patient's nurse.  DVT prophylaxis: Lovenox subcutaneous Code Status: DNR/DNI Family Communication: No family at bedside Disposition Plan: Keep in the stepdown unit.    Consultants:   Pulmonary critical care  Palliative  care  Procedures: None Antimicrobials: None  Subjective: Seen and examined at bedside. Still having shortness of breath and difficulty comparison this. Mildly better than yesterday. Has a cough with clear phlegm. No nausea vomiting. Objective: Vitals:   12/06/16 0757 12/06/16 0758 12/06/16 0846 12/06/16 1115  BP:   138/62 (!) 148/58  Pulse:   65   Resp:   18 (!) 26  Temp:   97.9 F (36.6 C) 97.8 F (36.6 C)  TempSrc:   Oral Oral  SpO2: 100% 100% 100% 98%  Weight:      Height:        Intake/Output Summary (Last 24 hours) at 12/06/16 1326 Last data filed at 12/06/16 1100  Gross per 24 hour  Intake              480 ml  Output              475 ml  Net                5 ml   Filed Weights   12/04/16 1134 12/05/16 0454 12/05/16 1236  Weight: 80.7 kg (178 lb) 80.7 kg (177 lb 14.6 oz) 88.1 kg (194 lb 4.8 oz)    Examination:  General exam: Sitting in bed Respiratory system: Bibasal decreased breath sound and wheeze, mildly improving compared to yesterday. Still having increased work of breathing Cardiovascular system: Regular rate rhythm, S1-S2 normal. No pedal edema. Gastrointestinal system: Abdomen is nondistended, soft and nontender. Normal bowel sounds heard. Central nervous system: Alert awake and oriented. No known focal neurological deficit. Extremities: Symmetric 5 x 5 power. Skin: No rashes, lesions or ulcers Psychiatry: Judgement and insight appear normal. Mood & affect appropriate.  Data Reviewed: I have personally reviewed following labs and imaging studies  CBC:  Recent Labs Lab 12/04/16 1210 12/05/16 0227 12/06/16 0249  WBC 18.2* 12.2* 21.6*  NEUTROABS 9.5*  --   --   HGB 11.3* 10.9* 11.0*  HCT 37.7 35.4* 35.7*  MCV 77.9* 78.1 76.9*  PLT 325 366 373   Basic Metabolic Panel:  Recent Labs Lab 12/04/16 1210 12/05/16 0227 12/06/16 0249  NA 139 136 134*  K 3.4* 4.1 3.9  CL 98* 98* 95*  CO2 GLUCOSE 103* 206* 223*  BUN 11 13 24*   CREATININE 0.98 1.02* 1.19*  CALCIUM 8.9 8.8* 8.7*   GFR: Estimated Creatinine Clearance: 48.4 mL/min (A) (by C-G formula based on SCr of 1.19 mg/dL (H)). Liver Function Tests:  Recent Labs Lab 12/04/16 1210  AST 23  ALT 11*  ALKPHOS 58  BILITOT 0.5  PROT 6.8  ALBUMIN 3.2*   No results for input(s): LIPASE, AMYLASE in the last 168 hours. No results for input(s): AMMONIA in the last 168 hours. Coagulation Profile: No results for input(s): INR, PROTIME in the last 168 hours. Cardiac Enzymes: No results for input(s): CKTOTAL, CKMB, CKMBINDEX, TROPONINI in the last 168 hours. BNP (last 3 results)  Recent Labs  03/01/16 1537  PROBNP 31.0   HbA1C: No results for input(s): HGBA1C in the last 72 hours. CBG:  Recent Labs Lab 12/05/16 1250 12/05/16 1640 12/05/16 2104 12/06/16 0845 12/06/16 1136  GLUCAP 129* 231* 165* 170* 224*   Lipid Profile: No results for input(s): CHOL, HDL, LDLCALC, TRIG, CHOLHDL, LDLDIRECT in the last 72 hours. Thyroid Function Tests: No results for input(s): TSH, T4TOTAL, FREET4, T3FREE, THYROIDAB in the last 72 hours. Anemia Panel: No results for input(s): VITAMINB12, FOLATE, FERRITIN, TIBC, IRON, RETICCTPCT in the last 72 hours. Sepsis Labs: No results for input(s): PROCALCITON, LATICACIDVEN in the last 168 hours.  Recent Results (from the past 240 hour(s))  Respiratory Panel by PCR     Status: None   Collection Time: 12/05/16  7:19 AM  Result Value Ref Range Status   Adenovirus NOT DETECTED NOT DETECTED Final   Coronavirus 229E NOT DETECTED NOT DETECTED Final   Coronavirus HKU1 NOT DETECTED NOT DETECTED Final   Coronavirus NL63 NOT DETECTED NOT DETECTED Final   Coronavirus OC43 NOT DETECTED NOT DETECTED Final   Metapneumovirus NOT DETECTED NOT DETECTED Final   Rhinovirus / Enterovirus NOT DETECTED NOT DETECTED Final   Influenza A NOT DETECTED NOT DETECTED Final   Influenza B NOT DETECTED NOT DETECTED Final   Parainfluenza Virus 1 NOT  DETECTED NOT DETECTED Final   Parainfluenza Virus 2 NOT DETECTED NOT DETECTED Final   Parainfluenza Virus 3 NOT DETECTED NOT DETECTED Final   Parainfluenza Virus 4 NOT DETECTED NOT DETECTED Final   Respiratory Syncytial Virus NOT DETECTED NOT DETECTED Final   Bordetella pertussis NOT DETECTED NOT DETECTED Final   Chlamydophila pneumoniae NOT DETECTED NOT DETECTED Final   Mycoplasma pneumoniae NOT DETECTED NOT DETECTED Final  MRSA PCR Screening     Status: None   Collection Time: 12/05/16 12:52 PM  Result Value Ref Range Status   MRSA by PCR NEGATIVE NEGATIVE Final    Comment:        The GeneXpert MRSA Assay (FDA approved for NASAL specimens only), is one component of a comprehensive MRSA colonization surveillance program. It is not intended to diagnose MRSA infection nor to guide or monitor treatment for MRSA infections.  Radiology Studies: Ct Chest High Resolution  Result Date: 12/06/2016 CLINICAL DATA:  Follow-up interstitial lung disease. EXAM: CT CHEST WITHOUT CONTRAST TECHNIQUE: Multidetector CT imaging of the chest was performed following the standard protocol without intravenous contrast. High resolution imaging of the lungs, as well as inspiratory and expiratory imaging, was performed. COMPARISON:  Chest radiograph from earlier today. 02/06/2016 high-resolution chest CT. FINDINGS: Cardiovascular: Normal heart size. No significant pericardial fluid/thickening. Left anterior descending, left circumflex and right coronary atherosclerosis. Atherosclerotic nonaneurysmal thoracic aorta. Stable top-normal caliber main pulmonary artery (3.0 cm diameter). Mediastinum/Nodes: No discrete thyroid nodules. Unremarkable esophagus. No axillary adenopathy. Stable mildly enlarged 1.2 cm right paratracheal node (series 4/ image 43). No additional pathologically enlarged mediastinal or gross hilar nodes on this noncontrast scan. Scattered coarsely calcified subcarinal and bilateral  hilar nodes are compatible with granulomatous disease. Lungs/Pleura: No pneumothorax. Stable trace pleural effusions/ mild pleural thickening in the dependent pleural spaces bilaterally. No acute consolidative airspace disease, lung masses or significant pulmonary nodules. There is extensive patchy confluent subpleural reticulation and ground-glass attenuation throughout both lungs with associated severe traction bronchiectasis, architectural distortion and volume loss. There is extensive honeycombing throughout both lungs involving all lung lobes. There is a basilar gradient to these findings. These findings have clearly progressed since 02/06/2016 and 09/05/2015 high-resolution chest CT studies. No significant air trapping on the expiration sequence. Upper abdomen: Curvilinear calcification associated with the depending gallbladder wall, partially visualized, unchanged. Musculoskeletal: No aggressive appearing focal osseous lesions. Mild thoracic spondylosis. IMPRESSION: 1. Progressive basilar predominant severe fibrotic interstitial lung disease with extensive honeycombing, diagnostic of usual interstitial pneumonia (UIP). 2. Stable trace dependent bilateral pleural effusions/mild pleural thickening. 3. Three-vessel coronary atherosclerosis. 4. Stable mild reactive mediastinal lymphadenopathy. Aortic Atherosclerosis (ICD10-I70.0). Electronically Signed   By: Delbert Phenix M.D.   On: 12/06/2016 09:12   Dg Chest Port 1 View  Result Date: 12/05/2016 CLINICAL DATA:  Acute respiratory distress. EXAM: PORTABLE CHEST 1 VIEW COMPARISON:  Chest x-rays dated 12/04/2016 and 09/30/2016. FINDINGS: Heart size and mediastinal contours are stable. The chronic interstitial lung disease/fibrosis is stable in appearance. No new airspace opacity to suggest a developing pneumonia. No evidence of superimposed pulmonary edema. No pleural effusion or pneumothorax seen. No acute or suspicious osseous finding. IMPRESSION: No active  disease. Severe chronic interstitial lung disease/fibrosis. No evidence of superimposed pneumonia or pulmonary edema. Electronically Signed   By: Bary Richard M.D.   On: 12/05/2016 11:33        Scheduled Meds: . aspirin EC  81 mg Oral Daily  . famotidine  20 mg Oral QHS  . guaiFENesin  1,200 mg Oral BID  . heparin  5,000 Units Subcutaneous Q8H  . hydrochlorothiazide  12.5 mg Oral Daily  . Influenza vac split quadrivalent PF  0.5 mL Intramuscular Tomorrow-1000  . insulin aspart  0-5 Units Subcutaneous QHS  . insulin aspart  0-9 Units Subcutaneous TID WC  . ipratropium  0.5 mg Nebulization TID  . levalbuterol  0.63 mg Nebulization TID  . levothyroxine  75 mcg Oral QAC breakfast  . losartan  100 mg Oral Daily  . methylPREDNISolone (SOLU-MEDROL) injection  60 mg Intravenous Q6H   Continuous Infusions:   LOS: 1 day    Kaz Auld Jaynie Collins, MD [facility] Pager [phone]  If 7PM-7AM, please contact night-coverage [email or web address] Password [email or web address] 12/06/2016, 1:26 PM

## 2016-12-06 NOTE — Care Management (Signed)
Referral given to [facility] per order.  Pt will likely discharge home with hospice Monday - once pt is optimized health wise.  Hospice to follow up with pt and CM

## 2016-12-06 NOTE — Consult Note (Signed)
Consultation Note Date: 12/06/2016   Patient Name: Kim [name]  DOB: 1940-07-22  MRN: [id number]  Age / Sex: 76 y.o., female  PCP: Lucianne Lei, MD Referring Physician: Rosita Fire, MD  Reason for Consultation: Non pain symptom management  HPI/Patient Profile: 76 y.o. female  with past medical history of ILD, HTN, HLD, GERD, and DM2. She initially presented to her PCP with progressive shortness of breath and cough. She then came to the ED after developing acute onset shortness of breath with hypoxia, despite supplemental oxygen. On arrival to the ED she was hypoxic to mid 60's on her home O2 of 2L. She was admitted on 12/04/2016 with acute on chronic respiratory failure attributed to an acute exacerbation of IPF. CT chest on admission did note progression of ILD from imaging in December 2017. Pt elected to be DNR and filled out Advanced Directive paperwork with Chaplain. Palliative consulted to assist in symptom management.  Clinical Assessment and Goals of Care: I met with Kim [name] at her bedside, multiple family members were present and she consented to discuss medical care and diagnoses in front of them.   Kim [name] had an excellent understanding of her health issues. She understood her lung disease was irreversible, progressing, and care at this point is focused around managing symptoms. She understands the progression of her lung disease also portends a poor prognosis and likely limited time. Her goal at this point is to be at home and comfortable. This goal is respected and supported by her family. We talked through ways to meet her goal, including a discussion on medications for symptom management and utilization of Hospice services. She and her family asked multiple good questions about Hospice, and ultimately felt it would be the best option for meeting her goals. She asked to begin Hospice on discharge once she has been medically  optimized here.   In terms of symptom management, her biggest concerns are shortness of breath and a dry cough. We talked through the etiology of these symptoms, non-pharmacologic interventions to help limit exertion and provide frequent rests, as well as medication options. She felt the Tussionex and Delsym had not been very effective. I discussed the option of trying an opioid, such as morphine, and discussed the risk and benefits. She was open to trying it. Given her sudden onset cough, I also recommended trying a scheduled cough medication.   Primary Decision Maker PATIENT   SUMMARY OF RECOMMENDATIONS    DNR, discussed and confirmed again with pt and her family  Plan for medical optimization then discharge home with Hospice. Pt given choice and she wanted [facility]. CM consulted placed to facilitate the referral.  Symptoms  Will start Morphine 66m q3H PRN and Benzonatate 1023mBID. Will stop Tussionex as she did not feel it helped. Agree with neb treatments.  Code Status/Advance Care Planning:  DNR  Additional Recommendations (Limitations, Scope, Preferences):  Medically optimize then discharge home with Hospice  Psycho-social/Spiritual:   Desire for further Chaplaincy support:no  Additional Recommendations: Education on Hospice  Prognosis:   < 6 months in the setting of progressive ILD with significant flare (third within a year), increasing O2 requirement, and increased symptom burden.  Discharge Planning: Home with Hospice      Primary Diagnoses: Present on Admission: . [place]piratory failure [place]  I have reviewed the medical record, interviewed the patient and family, and examined the patient. The following aspects are pertinent.  Past Medical History:  Diagnosis Date  . Acute bronchiolitis  due to respiratory syncytial virus (RSV) 02/08/2016  . ANA positive 02/09/2016  . Constipation due to pain medication   . Diabetes mellitus without complication ([place])   . GERD  (gastroesophageal reflux disease)   . Hyperlipemia   . Hypertension   . 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 Postinflammatory pulmonary fibrosis ([place])   . Seasonal allergies    Social History   Social History  . Marital status: Married    Spouse name: N/A  . Number of children: N/A  . Years of education: N/A   Social History Main Topics  . Smoking status: Former Smoker    Packs/day: 0.50    Years: 10.00    Types: Cigarettes    Quit date: 02/26/1988  . Smokeless tobacco: Never Used  . Alcohol use No  . Drug use: No  . Sexual activity: Not Asked   Other Topics Concern  . None   Social History Narrative  . None   Family History  Problem Relation Age of Onset  . Stomach cancer Sister   . Heart attack Sister    Scheduled Meds: . aspirin EC  81 mg Oral Daily  . famotidine  20 mg Oral QHS  . feeding supplement (ENSURE ENLIVE)  237 mL Oral BID BM  . guaiFENesin  1,200 mg Oral BID  . heparin  5,000 Units Subcutaneous Q8H  . hydrochlorothiazide  12.5 mg Oral Daily  . Influenza vac split quadrivalent PF  0.5 mL Intramuscular Tomorrow-1000  . insulin aspart  0-5 Units Subcutaneous QHS  . insulin aspart  0-9 Units Subcutaneous TID WC  . ipratropium  0.5 mg Nebulization TID  . levalbuterol  0.63 mg Nebulization TID  . levothyroxine  75 mcg Oral QAC breakfast  . losartan  100 mg Oral Daily  . methylPREDNISolone (SOLU-MEDROL) injection  60 mg Intravenous Q6H   Continuous Infusions: PRN Meds:.acetaminophen, chlorpheniramine-HYDROcodone, levalbuterol Allergies  Allergen Reactions  . Codeine Nausea And Vomiting and Other (See Comments)    "Takes my appetite" also  . Penicillins Rash    Has patient had a PCN reaction causing immediate rash, facial/tongue/throat swelling, SOB or lightheadedness with hypotension: Yes Has patient had a PCN reaction causing severe rash involving mucus membranes or skin necrosis: No Has patient had a PCN reaction that required hospitalization  No Has patient had a PCN reaction occurring within the last 10 years: No If all of the above answers are "NO", then may proceed with Cephalosporin use.    Review of Systems  Constitutional: Positive for activity change and fatigue. Negative for appetite change and unexpected weight change.  HENT: Negative for congestion, facial swelling, hearing loss, sore throat and trouble swallowing.   Eyes: Negative for visual disturbance.  Respiratory: Positive for cough and shortness of breath. Negative for chest tightness.   Cardiovascular: Negative for chest pain and leg swelling.  Gastrointestinal: Negative for abdominal distention, abdominal pain, constipation, diarrhea, nausea and vomiting.  Genitourinary:       Occasion need to strain with urination  Musculoskeletal: Negative for back pain.  Neurological: Negative for dizziness, speech difficulty, light-headedness and numbness.  Psychiatric/Behavioral: Negative for behavioral problems, confusion, decreased concentration and sleep disturbance. The patient is not nervous/anxious.    Physical Exam  Constitutional: She is oriented to person, place, and time. She appears well-developed and well-nourished. No distress.  HENT:  Head: Normocephalic and atraumatic.  Mouth/Throat: Oropharynx is clear and moist. Abnormal dentition. No oropharyngeal exudate.  Eyes: EOM are normal.  Neck: Normal range of motion.  Neck supple.  Cardiovascular: Normal rate and regular rhythm.   Pulmonary/Chest: Tachypnea (stable at rest, present with conversation) noted. She has decreased breath sounds. She has wheezes (mild). She has rhonchi.  Abdominal: Soft. Bowel sounds are normal. She exhibits no distension.  Musculoskeletal: Normal range of motion. She exhibits no edema.  Neurological: She is alert and oriented to person, place, and time.  Skin: Skin is dry.  Psychiatric: She has a normal mood and affect. Her behavior is normal. Judgment and thought content normal.     Vital Signs: BP 138/62 (BP Location: Left Arm)   Pulse 65   Temp 97.9 F (36.6 C) (Oral)   Resp 18   Ht 5' 9"  (1.753 m)   Wt 88.1 kg (194 lb 4.8 oz)   SpO2 100%   BMI 28.69 kg/m  Pain Assessment: No/denies pain   Pain Score: Asleep   SpO2: SpO2: 100 % O2 Device:SpO2: 100 % O2 Flow Rate: .O2 Flow Rate (L/min): 4 L/min  IO: Intake/output summary:  Intake/Output Summary (Last 24 hours) at 12/06/16 1045 Last data filed at 12/06/16 0738  Gross per 24 hour  Intake              720 ml  Output              675 ml  Net               45 ml    LBM: Last BM Date: 12/04/16 Baseline Weight: Weight: 80.7 kg (178 lb) Most recent weight: Weight: 88.1 kg (194 lb 4.8 oz)     Palliative Assessment/Data: PPS 40% r/t respiratory status    Time Total: 50 minutes Greater than 50%  of this time was spent counseling and coordinating care related to the above assessment and plan.  Signed by: Charlynn Court, NP Palliative Medicine Team Pager # [phone] (M-F 7a-5p) Team Phone # [phone] (Nights/Weekends)

## 2016-12-06 NOTE — Progress Notes (Signed)
[facility]) Hospital Liaison:  RN visit  Notified by Lelon Mast [facility], of patient request for [facility] at home after discharge.  Chart and patient information under review by [facility] physician.  Hospital eligibility pending at this time.  Writer spoke with patient at bedside to initiate education related to hospice philosophy, services and team approach to care.  Patient verbalized understanding of information given.  Per discussion, plan is for discharge to home by private vehicle on 12/09/16 (tentatively).  Please send signed and completed DNR form home with patient.  Patient will need prescriptions for discharge comfort medications.  DME needs have been discussed, patient currently has the following equipment in the home:  Wheelchair, walker, hoveround and O2 concentrator with portable tanks.  Patient requests the following DME for delivery to the home:  Hospital bed and OBT.  Patient denies need for additional equipment.  [facility] equipment manager has been notified and will contact [facility] to arrange delivery to the home.  Home address has been verified and is correct in the chart.  Ditina, daughter, is the family member to contact to arrange time of delivery at [phone].  [facility] referral center aware of the above.  Completed discharge summary will need to be faxed to [facility] at [phone], when final.  Please notify [facility] when patient is ready to leave the unit at discharge.  (Call [phone] or [phone] after 5pm).  [facility] information and contact numbers given to patient during this visit.  Above information shared with Lelon Mast, [facility].  Please call with any hospice related questions.   Thank you for this referral.  Adele Barthel, RN, BSN [facility] Liaison [phone]  All hospital liaisons are now on AMION.

## 2016-12-07 DIAGNOSIS — J9601 Acute respiratory failure with hypoxia: Secondary | ICD-10-CM

## 2016-12-07 DIAGNOSIS — J849 Interstitial pulmonary disease, unspecified: Secondary | ICD-10-CM

## 2016-12-07 LAB — GLUCOSE, CAPILLARY
GLUCOSE-CAPILLARY: 272 mg/dL — AB (ref 65–99)
GLUCOSE-CAPILLARY: 127 mg/dL — AB (ref 65–99)
Glucose-Capillary: 216 mg/dL — ABNORMAL HIGH (ref 65–99)
Glucose-Capillary: 195 mg/dL — ABNORMAL HIGH (ref 65–99)

## 2016-12-07 LAB — SEDIMENTATION RATE: SED RATE: 16 mm/h (ref 0–22)

## 2016-12-07 MED ORDER — BENZONATATE 100 MG PO CAPS
200.0000 mg | ORAL_CAPSULE | Freq: Three times a day (TID) | ORAL | Status: DC
  Administered 2016-12-07 – 2016-12-09 (×7): 200 mg via ORAL
  Filled 2016-12-07 (×7): qty 2

## 2016-12-07 MED ORDER — SENNA 8.6 MG PO TABS
1.0000 | ORAL_TABLET | Freq: Every day | ORAL | Status: DC
  Administered 2016-12-07 – 2016-12-09 (×3): 8.6 mg via ORAL
  Filled 2016-12-07 (×3): qty 1

## 2016-12-07 MED ORDER — PANTOPRAZOLE SODIUM 40 MG PO TBEC
40.0000 mg | DELAYED_RELEASE_TABLET | Freq: Every day | ORAL | Status: DC
  Administered 2016-12-07 – 2016-12-08 (×2): 40 mg via ORAL
  Filled 2016-12-07: qty 1

## 2016-12-07 NOTE — Progress Notes (Signed)
PROGRESS NOTE    Kim [name]  XBJ:[id number] DOB: 10/18/1940 DOA: 12/04/2016 PCP: Renaye Rakers, MD   Brief Narrative: 76 y.o. female with medical history significant of ILD, chronic hypoxia on 2 L of oxygen at home during the rest and up to 6 L with ambulation, HTN, HLD, GERD, DM, sent by her PCP for acute hypoxia with shortness of breath. As per H&P, patient had oxygen saturation of 60s in triage on 2 L. She required nonrebreather and up to 6 L of oxygen. Admitted for further evaluation.  Assessment & Plan:   # Acute on chronic respiratory failure with hypoxia likely due to ILD flare: Chest x-ray with known interstitial lung disease with no acute finding. D-dimer not elevated.  -patient is still has chest tightness and difficulty speaking in sentences because of shortness of breath. Plan to continue current dose of Solu-Medrol, pulmonary following. Palliative care is following for dyspnea. Patient reported dyspnea is better with morphine concentrate.i have discussed with the palliative care team. Patient will likely go home with hospice and she is currently DO NOT RESUSCITATE. -Continue Atrovent, Xopenex nebulization. Discussed with the respiratory team.  #Diabetes: Continue sliding scale. Monitor blood sugar level especially while on steroid  #Hypertension: Blood pressure acceptable. Continue HCTZ, losartan.  #Hypothyroidism: Continue Synthroid.  #GERD continue Pepcid  # Goals of care discussion: Patient is DNR/DNI and understand the severity of interest is interstitial lung disease. She does not want intubation or CPR. Palliative care is following. He daily values when patient can ambulate.Currently she is dyspneic.  DVT prophylaxis: Lovenox subcutaneous Code Status: DNR/DNI Family Communication: No family at bedside Disposition Plan: Keep in the stepdown unit.    Consultants:   Pulmonary critical care  Palliative care  Procedures: None Antimicrobials:  None  Subjective: Seen and examined at bedside. Reported somewhat better today but is still looks dyspneic and unable to complete the sentence. Denied chest pain, headache, nausea or vomiting. Has dry cough. Objective: Vitals:   12/07/16 0317 12/07/16 0802 12/07/16 0859 12/07/16 1203  BP: (!) 149/69 (!) 146/59  114/65  Pulse:  90  77  Resp: (!) 27 (!) 21  (!) 25  Temp: (!) 97.5 F (36.4 C) 97.7 F (36.5 C)  97.9 F (36.6 C)  TempSrc: Oral Oral  Oral  SpO2: 98% 93% 96% 96%  Weight:      Height:        Intake/Output Summary (Last 24 hours) at 12/07/16 1234 Last data filed at 12/07/16 0900  Gross per 24 hour  Intake             1360 ml  Output              575 ml  Net              785 ml   Filed Weights   12/04/16 1134 12/05/16 0454 12/05/16 1236  Weight: 80.7 kg (178 lb) 80.7 kg (177 lb 14.6 oz) 88.1 kg (194 lb 4.8 oz)    Examination:  General exam: looks mild respiratory distress but better than yesterday Respiratory system: bilateral diffuse decreased air entry with wheezing and basilar crackles. Cardiovascular system: regular rate and rhythm, S1-S2 normal. No pedal edema Gastrointestinal system:abdomen soft, nontender. Bowel sound positive Central nervous system: alert awake and oriented. No focal neurological deficit. Skin: No rashes, lesions or ulcers Psychiatry: Judgement and insight appear normal. Mood & affect appropriate.     Data Reviewed: I have personally reviewed following labs and imaging studies  CBC:  Recent Labs Lab 12/04/16 1210 12/05/16 0227 12/06/16 0249  WBC 18.2* 12.2* 21.6*  NEUTROABS 9.5*  --   --   HGB 11.3* 10.9* 11.0*  HCT 37.7 35.4* 35.7*  MCV 77.9* 78.1 76.9*  PLT 325 366 373   Basic Metabolic Panel:  Recent Labs Lab 12/04/16 1210 12/05/16 0227 12/06/16 0249  NA 139 136 134*  K 3.4* 4.1 3.9  CL 98* 98* 95*  CO2 GLUCOSE 103* 206* 223*  BUN 11 13 24*  CREATININE 0.98 1.02* 1.19*  CALCIUM 8.9 8.8* 8.7*    GFR: Estimated Creatinine Clearance: 48.4 mL/min (A) (by C-G formula based on SCr of 1.19 mg/dL (H)). Liver Function Tests:  Recent Labs Lab 12/04/16 1210  AST 23  ALT 11*  ALKPHOS 58  BILITOT 0.5  PROT 6.8  ALBUMIN 3.2*   No results for input(s): LIPASE, AMYLASE in the last 168 hours. No results for input(s): AMMONIA in the last 168 hours. Coagulation Profile: No results for input(s): INR, PROTIME in the last 168 hours. Cardiac Enzymes: No results for input(s): CKTOTAL, CKMB, CKMBINDEX, TROPONINI in the last 168 hours. BNP (last 3 results)  Recent Labs  03/01/16 1537  PROBNP 31.0   HbA1C: No results for input(s): HGBA1C in the last 72 hours. CBG:  Recent Labs Lab 12/06/16 0845 12/06/16 1136 12/06/16 1652 12/06/16 2123 12/07/16 0803  GLUCAP 170* 224* 224* 174* 216*   Lipid Profile: No results for input(s): CHOL, HDL, LDLCALC, TRIG, CHOLHDL, LDLDIRECT in the last 72 hours. Thyroid Function Tests: No results for input(s): TSH, T4TOTAL, FREET4, T3FREE, THYROIDAB in the last 72 hours. Anemia Panel: No results for input(s): VITAMINB12, FOLATE, FERRITIN, TIBC, IRON, RETICCTPCT in the last 72 hours. Sepsis Labs: No results for input(s): PROCALCITON, LATICACIDVEN in the last 168 hours.  Recent Results (from the past 240 hour(s))  Respiratory Panel by PCR     Status: None   Collection Time: 12/05/16  7:19 AM  Result Value Ref Range Status   Adenovirus NOT DETECTED NOT DETECTED Final   Coronavirus 229E NOT DETECTED NOT DETECTED Final   Coronavirus HKU1 NOT DETECTED NOT DETECTED Final   Coronavirus NL63 NOT DETECTED NOT DETECTED Final   Coronavirus OC43 NOT DETECTED NOT DETECTED Final   Metapneumovirus NOT DETECTED NOT DETECTED Final   Rhinovirus / Enterovirus NOT DETECTED NOT DETECTED Final   Influenza A NOT DETECTED NOT DETECTED Final   Influenza B NOT DETECTED NOT DETECTED Final   Parainfluenza Virus 1 NOT DETECTED NOT DETECTED Final   Parainfluenza Virus 2  NOT DETECTED NOT DETECTED Final   Parainfluenza Virus 3 NOT DETECTED NOT DETECTED Final   Parainfluenza Virus 4 NOT DETECTED NOT DETECTED Final   Respiratory Syncytial Virus NOT DETECTED NOT DETECTED Final   Bordetella pertussis NOT DETECTED NOT DETECTED Final   Chlamydophila pneumoniae NOT DETECTED NOT DETECTED Final   Mycoplasma pneumoniae NOT DETECTED NOT DETECTED Final  MRSA PCR Screening     Status: None   Collection Time: 12/05/16 12:52 PM  Result Value Ref Range Status   MRSA by PCR NEGATIVE NEGATIVE Final    Comment:        The GeneXpert MRSA Assay (FDA approved for NASAL specimens only), is one component of a comprehensive MRSA colonization surveillance program. It is not intended to diagnose MRSA infection nor to guide or monitor treatment for MRSA infections.          Radiology Studies: Ct Chest High Resolution  Result Date:  12/06/2016 CLINICAL DATA:  Follow-up interstitial lung disease. EXAM: CT CHEST WITHOUT CONTRAST TECHNIQUE: Multidetector CT imaging of the chest was performed following the standard protocol without intravenous contrast. High resolution imaging of the lungs, as well as inspiratory and expiratory imaging, was performed. COMPARISON:  Chest radiograph from earlier today. 02/06/2016 high-resolution chest CT. FINDINGS: Cardiovascular: Normal heart size. No significant pericardial fluid/thickening. Left anterior descending, left circumflex and right coronary atherosclerosis. Atherosclerotic nonaneurysmal thoracic aorta. Stable top-normal caliber main pulmonary artery (3.0 cm diameter). Mediastinum/Nodes: No discrete thyroid nodules. Unremarkable esophagus. No axillary adenopathy. Stable mildly enlarged 1.2 cm right paratracheal node (series 4/ image 43). No additional pathologically enlarged mediastinal or gross hilar nodes on this noncontrast scan. Scattered coarsely calcified subcarinal and bilateral hilar nodes are compatible with granulomatous disease.  Lungs/Pleura: No pneumothorax. Stable trace pleural effusions/ mild pleural thickening in the dependent pleural spaces bilaterally. No acute consolidative airspace disease, lung masses or significant pulmonary nodules. There is extensive patchy confluent subpleural reticulation and ground-glass attenuation throughout both lungs with associated severe traction bronchiectasis, architectural distortion and volume loss. There is extensive honeycombing throughout both lungs involving all lung lobes. There is a basilar gradient to these findings. These findings have clearly progressed since 02/06/2016 and 09/05/2015 high-resolution chest CT studies. No significant air trapping on the expiration sequence. Upper abdomen: Curvilinear calcification associated with the depending gallbladder wall, partially visualized, unchanged. Musculoskeletal: No aggressive appearing focal osseous lesions. Mild thoracic spondylosis. IMPRESSION: 1. Progressive basilar predominant severe fibrotic interstitial lung disease with extensive honeycombing, diagnostic of usual interstitial pneumonia (UIP). 2. Stable trace dependent bilateral pleural effusions/mild pleural thickening. 3. Three-vessel coronary atherosclerosis. 4. Stable mild reactive mediastinal lymphadenopathy. Aortic Atherosclerosis (ICD10-I70.0). Electronically Signed   By: Delbert Phenix M.D.   On: 12/06/2016 09:12        Scheduled Meds: . aspirin EC  81 mg Oral Daily  . benzonatate  200 mg Oral TID  . enoxaparin (LOVENOX) injection  40 mg Subcutaneous Q24H  . famotidine  20 mg Oral QHS  . guaiFENesin  1,200 mg Oral BID  . hydrochlorothiazide  12.5 mg Oral Daily  . Influenza vac split quadrivalent PF  0.5 mL Intramuscular Tomorrow-1000  . insulin aspart  0-5 Units Subcutaneous QHS  . insulin aspart  0-9 Units Subcutaneous TID WC  . ipratropium  0.5 mg Nebulization TID  . levalbuterol  0.63 mg Nebulization TID  . levothyroxine  75 mcg Oral QAC breakfast  . losartan   100 mg Oral Daily  . methylPREDNISolone (SOLU-MEDROL) injection  60 mg Intravenous Q6H  . pantoprazole  40 mg Oral Q1200  . senna  1 tablet Oral Daily   Continuous Infusions:   LOS: 2 days    Mychelle Kendra Jaynie Collins, MD [facility] Pager [phone]  If 7PM-7AM, please contact night-coverage [email or web address] Password [facility] 12/07/2016, 12:34 PM

## 2016-12-07 NOTE — Progress Notes (Signed)
Name: Kim [name] MRN: [id number] DOB: [birth year]    ADMISSION DATE:  12/04/2016 CONSULTATION DATE:  10/11  REFERRING MD :  Carolin Sicks ([facility])   CHIEF COMPLAINT:  Hypoxia, ILD   BRIEF PATIENT DESCRIPTION: 76yo female with hx ILD from remote ALI with marked elevation in ESR and steroid dependent component followed by Dr. Melvyn Novas on 57m prednisone daily, HTN, GERD, DM, chronic hypoxic respiratory failure on home O2 (2L at rest, 6L with exertion) presented 10/10 with SOB, nonproductive cough and increased O2 needs.  She was admitted by [facility] with acute on chronic respiratory failure thought r/t IDL flare and treated with nebs and IV steroids.  She continued to have increased O2 demands and PCCM consulted 10/11.      STUDIES:  RVP 10/10>>> neg         SUBJECTIVE:  Lying in bed in no distress on 2lpm / min dry cough    Intake/Output Summary (Last 24 hours) at 12/07/16 1147 Last data filed at 12/07/16 0900  Gross per 24 hour  Intake             1360 ml  Output              575 ml  Net              785 ml       VITAL SIGNS: Temp:  [97.5 F (36.4 C)-98.2 F (36.8 C)] 97.7 F (36.5 C) (10/13 0802) Pulse Rate:  [77-90] 90 (10/13 0802) Resp:  [20-32] 21 (10/13 0802) BP: (139-152)/(59-86) 146/59 (10/13 0802) SpO2:  [93 %-99 %] 96 % (10/13 0859)  PHYSICAL EXAMINATION: General:  Chronically ill elderly bf nad  HEENT: MM pink/moist, No JVD or cx adenopathy PSY:Nl affect, pleasant Neuro: Intact sensorium, no obvious motor def  CV: s1s2 rrr, no m/r/g PULM: diffuse insp crackles on insp with cough on insp  GWL:NLGX non-tender, bsx4 active  Extremities: warm/dry,-edema  Skin: no rashes or lesions    Recent Labs Lab 12/04/16 1210 12/05/16 0227 12/06/16 0249  NA 139 136 134*  K 3.4* 4.1 3.9  CL 98* 98* 95*  CO2 27 26 26   BUN 11 13 24*  CREATININE 0.98 1.02* 1.19*  GLUCOSE 103* 206* 223*    Recent Labs Lab 12/04/16 1210 12/05/16 0227 12/06/16 0249  HGB  11.3* 10.9* 11.0*  HCT 37.7 35.4* 35.7*  WBC 18.2* 12.2* 21.6*  PLT 325 366 373   Ct Chest High Resolution  Result Date: 12/06/2016 CLINICAL DATA:  Follow-up interstitial lung disease. EXAM: CT CHEST WITHOUT CONTRAST TECHNIQUE: Multidetector CT imaging of the chest was performed following the standard protocol without intravenous contrast. High resolution imaging of the lungs, as well as inspiratory and expiratory imaging, was performed. COMPARISON:  Chest radiograph from earlier today. 02/06/2016 high-resolution chest CT. FINDINGS: Cardiovascular: Normal heart size. No significant pericardial fluid/thickening. Left anterior descending, left circumflex and right coronary atherosclerosis. Atherosclerotic nonaneurysmal thoracic aorta. Stable top-normal caliber main pulmonary artery (3.0 cm diameter). Mediastinum/Nodes: No discrete thyroid nodules. Unremarkable esophagus. No axillary adenopathy. Stable mildly enlarged 1.2 cm right paratracheal node (series 4/ image 43). No additional pathologically enlarged mediastinal or gross hilar nodes on this noncontrast scan. Scattered coarsely calcified subcarinal and bilateral hilar nodes are compatible with granulomatous disease. Lungs/Pleura: No pneumothorax. Stable trace pleural effusions/ mild pleural thickening in the dependent pleural spaces bilaterally. No acute consolidative airspace disease, lung masses or significant pulmonary nodules. There is extensive patchy confluent subpleural reticulation and ground-glass attenuation throughout both  lungs with associated severe traction bronchiectasis, architectural distortion and volume loss. There is extensive honeycombing throughout both lungs involving all lung lobes. There is a basilar gradient to these findings. These findings have clearly progressed since 02/06/2016 and 09/05/2015 high-resolution chest CT studies. No significant air trapping on the expiration sequence. Upper abdomen: Curvilinear calcification  associated with the depending gallbladder wall, partially visualized, unchanged. Musculoskeletal: No aggressive appearing focal osseous lesions. Mild thoracic spondylosis. IMPRESSION: 1. Progressive basilar predominant severe fibrotic interstitial lung disease with extensive honeycombing, diagnostic of usual interstitial pneumonia (UIP). 2. Stable trace dependent bilateral pleural effusions/mild pleural thickening. 3. Three-vessel coronary atherosclerosis. 4. Stable mild reactive mediastinal lymphadenopathy. Aortic Atherosclerosis (ICD10-I70.0). Electronically Signed   By: Ilona Sorrel M.D.   On: 12/06/2016 09:12        ASSESSMENT / PLAN:  Acute on chronic hypoxic respiratory failure - likely r/t ILD flare. No acute change on  ILD   PLAN -    Continue IV steroids  Supplemental O2 as needed -- baseline is 2L @rest  and 6L with exertion >> Goal sat >88%  Pulmonary hygiene  Cough suppression PRN duonebs She has been made DNR at her request    I don't really think she's endstage as has responded again to boost in steroids as in past but do feel dnr status reasonable given that it would be unlikely that coding her would bring her back to independent quality living given all the chronic/ non-reversible  issues she presently faces    Will check esr and titrate prednisone to lowest effective dose to achieve esr < 20  Using the "lowest effective dose is the right dose" strategy and try to mobilize if possible.     Christinia Gully, MD [facility] and [place] [phone] After 5:30 PM or weekends, use [facility] [phone]

## 2016-12-07 NOTE — Progress Notes (Signed)
Daily Progress Note   Patient Name: Kim [name]       Date: 12/07/2016 DOB: [birth year]  Age: 76 y.o. MRN#: [id number] Attending Physician: Maxie Barb, MD Primary Care Physician: Renaye Rakers, MD Admit Date: 12/04/2016  Reason for Consultation/Follow-up: Non pain symptom management and Psychosocial/spiritual support  Subjective: Kim [name] is feeling slightly better this morning despite not sleeping well overnight (trouble falling asleep, not respiratory related). Her shortness of breath is slightly better, and her overnight coughing improved. She is having slightly more daytime coughing this morning, however, which is her primary complaint today. She did not require any PRN morphine overnight.  Length of Stay: 2  Current Medications: Scheduled Meds:  . aspirin EC  81 mg Oral Daily  . benzonatate  200 mg Oral TID  . enoxaparin (LOVENOX) injection  40 mg Subcutaneous Q24H  . famotidine  20 mg Oral QHS  . guaiFENesin  1,200 mg Oral BID  . hydrochlorothiazide  12.5 mg Oral Daily  . Influenza vac split quadrivalent PF  0.5 mL Intramuscular Tomorrow-1000  . insulin aspart  0-5 Units Subcutaneous QHS  . insulin aspart  0-9 Units Subcutaneous TID WC  . ipratropium  0.5 mg Nebulization TID  . levalbuterol  0.63 mg Nebulization TID  . levothyroxine  75 mcg Oral QAC breakfast  . losartan  100 mg Oral Daily  . methylPREDNISolone (SOLU-MEDROL) injection  60 mg Intravenous Q6H    Continuous Infusions:   PRN Meds: acetaminophen, levalbuterol, morphine CONCENTRATE  Physical Exam      Constitutional: She is oriented to person, place, and time. She appears well-developed and well-nourished. No distress.  HENT:  Head: Normocephalic and atraumatic.  Mouth/Throat: Oropharynx is clear and moist. Abnormal dentition. No  oropharyngeal exudate.  Eyes: EOM are normal.  Neck: Normal range of motion. Neck supple.  Cardiovascular: Normal rate and regular rhythm.   Pulmonary/Chest: Tachypnea (stable at rest, present with conversation) noted. She has decreased breath sounds. She has no wheezes. She has no rhonchi. Abdominal: Soft. Bowel sounds are normal. She exhibits no distension.  Musculoskeletal: Normal range of motion. She exhibits no edema.  Neurological: She is alert and oriented to person, place, and time.  Skin: Skin is dry.  Psychiatric: She has a normal mood and affect. Her behavior is normal. Judgment and thought content normal.      Vital Signs: BP (!) 146/59 (BP Location: Right Arm)   Pulse 90   Temp 97.7 F (36.5 C) (Oral)   Resp (!) 21   Ht  (1.753 m)   Wt 88.1 kg (194 lb 4.8 oz)   SpO2 93%   BMI 28.69 kg/m  SpO2: SpO2: 93 % O2 Device: O2 Device: Nasal Cannula O2 Flow Rate: O2 Flow Rate (L/min): 4 L/min  Intake/output summary:  Intake/Output Summary (Last 24 hours) at 12/07/16 0844 Last data filed at 12/07/16 0456  Gross per 24 hour  Intake             1200 ml  Output              675 ml  Net              525 ml  LBM: Last BM Date: 12/04/16 Baseline Weight: Weight: 80.7 kg (178 lb) Most recent weight: Weight: 88.1 kg (194 lb 4.8 oz)  Palliative Assessment/Data: PPS 50-60%    Patient Active Problem List   Diagnosis Date Noted  . Goals of care, counseling/discussion   . Shortness of breath   . Palliative care by specialist   . ILD (interstitial lung disease) (HCC)   . Respiratory failure (HCC) 12/04/2016  . ANA positive 02/09/2016  . Acute bronchiolitis due to respiratory syncytial virus (RSV) 02/08/2016  . Chronic respiratory failure with hypoxia (HCC) 02/06/2016  . HCAP (healthcare-associated pneumonia) 02/06/2016  . 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 Acute respiratory failure with hypoxia (HCC) 01/25/2016  . Hypothyroidism 01/25/2016  . DJD (degenerative joint  disease) of knee 07/06/2014  . 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 Pure hypercholesterolemia 07/29/2012  . Type 2 diabetes mellitus with complication, without long-term current use of insulin (HCC) 01/16/2010  . HTN (hypertension) 01/16/2010  . Upper airway cough syndrome 01/12/2010    Palliative Care Assessment & Plan   HPI: 76 y.o. female  with past medical history of ILD, HTN, HLD, GERD, and DM2. She initially presented to her PCP with progressive shortness of breath and cough. She then came to the ED after developing acute onset shortness of breath with hypoxia, despite supplemental oxygen. On arrival to the ED she was hypoxic to mid 60's on her home O2 of 2L. She was admitted on 12/04/2016 with acute on chronic respiratory failure attributed to an acute exacerbation of IPF. CT chest on admission did note progression of ILD from imaging in December 2017. Pt elected to be DNR and filled out Advanced Directive paperwork with Chaplain. Palliative consulted to assist in symptom management.  Assessment: Kim [name] does seem to be improving. She reports improvement in her shortness of breath and coughing overnight. Her symptoms are not fully under control, however, as she is still experiencing daytime coughing. We discussed the availability of PRN medications. She related that she does use the Benzonatate at home, however she takes . I confirmed this with her home medication list and adjusted the scheduled dose here from  to . I will leave it schedulede for now, however this can be adjusted back to PRN on discharge. Additionally, as Tussionex had not proven effective for her, I encouraged her to try the PRN Morphine.   Recommendations/Plan:  Plan for medical optimization then discharge home with Hospice. [facility] has already been in contact with her.  Symptoms  Change Benzonatate to  TID, can change this back to home regimen on discharge  Encouraged  use of PRN morphine for cough and SOB  Will add senna 1 tab daily to ensure constipation prevention given opioid use  Code Status:  DNR  Prognosis:   < 6 months  Discharge Planning:  Home with Hospice  Care plan was discussed with pt and care nurse.  Thank you for allowing the Palliative Medicine Team to assist in the care of this patient.  Total time: 25 minutes    Greater than 50%  of this time was spent counseling and coordinating care related to the above assessment and plan.  Murrell Converse, NP Palliative Medicine Team [phone] pager (7a-5p) Team Phone # [phone]

## 2016-12-08 LAB — GLUCOSE, CAPILLARY
GLUCOSE-CAPILLARY: 183 mg/dL — AB (ref 65–99)
GLUCOSE-CAPILLARY: 269 mg/dL — AB (ref 65–99)
GLUCOSE-CAPILLARY: 191 mg/dL — AB (ref 65–99)
GLUCOSE-CAPILLARY: 223 mg/dL — AB (ref 65–99)

## 2016-12-08 MED ORDER — METHYLPREDNISOLONE SODIUM SUCC 125 MG IJ SOLR
60.0000 mg | Freq: Two times a day (BID) | INTRAMUSCULAR | Status: DC
  Administered 2016-12-08 – 2016-12-09 (×2): 60 mg via INTRAVENOUS
  Filled 2016-12-08 (×2): qty 2

## 2016-12-08 NOTE — Progress Notes (Signed)
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 Melvyn Novas on 42m prednisone daily, HTN, GERD, DM, chronic hypoxic respiratory failure on home O2 (2L at rest, 6L with exertion) presented 10/10 with SOB, nonproductive cough and increased O2 needs.  She was admitted by [facility] with acute on chronic respiratory failure thought r/t IDL flare and treated with nebs and IV steroids.  She continued to have increased O2 demands and PCCM consulted 10/11.      STUDIES:  RVP 10/10>>> neg  ESR  1November 12, 2024:     16            SUBJECTIVE:  Back to [place]line and wants to go home/ declining Hospice     Intake/Output Summary (Last 24 hours) at 12/08/16 1230 Last data filed at 12/08/16 0900  Gross per 24 hour  Intake              840 ml  Output              200 ml  Net              640 ml       VITAL SIGNS: Temp:  [97.5 F (36.4 C)-98.1 F (36.7 C)] 98 F (36.7 C) (10/14 1040) Pulse Rate:  [65-90] 90 (10/14 1040) Resp:  [16-28] 21 (10/14 1040) BP: (136-165)/(62-70) 152/68 (10/14 1040) SpO2:  [93 %-99 %] 93 % (10/14 1040) FiO2 (%):  [28 %] 28 % (10/14 0736)  PHYSICAL EXAMINATION: General:  Chronically ill elderly bf nad supine  HEENT: MM pink/moist, No JVD or cx adenopathy PSY:Nl affect, pleasant Neuro: Intact sensorium, no obvious motor def  CV: s1s2 rrr, no m/r/g PULM: diffuse insp crackles on insp worse in bases  with min cough on insp  GJG:GEZM non-tender, bsx4 active  Extremities: warm/dry,-edema  Skin: no rashes or lesions    Recent Labs Lab 12/04/16 1210 12/05/16 0227 12/06/16 0249  NA 139 136 134*  K 3.4* 4.1 3.9  CL 98* 98* 95*  CO2 _0 BUN 11 13 24*  CREATININE 0.98 1.02* 1.19*    GLUCOSE 103* 206* 223*    Recent Labs Lab 12/04/16 1210 12/05/16 0227 12/06/16 0249  HGB 11.3* 10.9* 11.0*  HCT 37.7 35.4* 35.7*  WBC 18.2* 12.2* 21.6*  PLT 325 366 373   No results found.      ASSESSMENT / PLAN:  Acute on chronic hypoxic respiratory failure - likely r/t ILD flare. No acute change on  ILD   PLAN -    Taper steroids to prednisone 20 mg daily over next 2 weeks then ov before taper further Supplemental O2 as needed -- baseline is 2L _1  and 6L with exertion >> Goal sat >88%  Pulmonary hygiene  Cough suppression PRN duonebs She has been made DNR at her request    I don't really think she's endstage as has responded again to boost in steroids as in past but do feel dnr status reasonable given that it would be unlikely that coding her would bring her back to independent quality living given all the chronic/ non-reversible  issues she presently faces      Offered to see in office in 2 weeks  but  Must bring  all meds in hand using a trust but verify approach to confirm accurate Medication  Reconciliation The principal here is that until we are certain that the  patients are doing what we've asked, it makes no sense to ask them to do more.    Christinia Gully, MD Pulmonary and [place] [phone] After 5:30 PM or weekends, use [facility] [phone]

## 2016-12-08 NOTE — Progress Notes (Signed)
Daily Progress Note   Patient Name: Kim [name]       Date: 12/08/2016 DOB: [birth year]  Age: 76 y.o. MRN#: [id number] Attending Physician: Maxie Barb, MD Primary Care Physician: Renaye Rakers, MD Admit Date: 12/04/2016  Reason for Consultation/Follow-up: Non pain symptom management and Psychosocial/spiritual support  Subjective: Kim [name] continues to feel better. She is able to talk in full sentences for a prolonged period, move about in bed without issue, and her cough has improved significantly. She has not required any PRN morphine, and at this point feels she will not need it.  Length of Stay: 3  Current Medications: Scheduled Meds:  . aspirin EC  81 mg Oral Daily  . benzonatate  200 mg Oral TID  . enoxaparin (LOVENOX) injection  40 mg Subcutaneous Q24H  . famotidine  20 mg Oral QHS  . guaiFENesin  1,200 mg Oral BID  . hydrochlorothiazide  12.5 mg Oral Daily  . Influenza vac split quadrivalent PF  0.5 mL Intramuscular Tomorrow-1000  . insulin aspart  0-5 Units Subcutaneous QHS  . insulin aspart  0-9 Units Subcutaneous TID WC  . ipratropium  0.5 mg Nebulization TID  . levalbuterol  0.63 mg Nebulization TID  . levothyroxine  75 mcg Oral QAC breakfast  . losartan  100 mg Oral Daily  . methylPREDNISolone (SOLU-MEDROL) injection  60 mg Intravenous Q6H  . pantoprazole  40 mg Oral Q1200  . senna  1 tablet Oral Daily    Continuous Infusions:   PRN Meds: acetaminophen, levalbuterol  Physical Exam      Constitutional: She is oriented to person, place, and time. She appears well-developed and well-nourished. No distress.  HENT:  Head: Normocephalic and atraumatic.  Mouth/Throat: Oropharynx is clear and moist. Abnormal dentition. No oropharyngeal exudate.  Eyes: EOM are normal.  Neck: Normal range of motion.  Neck supple.  Cardiovascular: Normal rate and regular rhythm.   Pulmonary/Chest. Normal respiratory rate. She has decreased breath sounds in bilateral bases. She has no wheezes. She has no rhonchi. Abdominal: Soft. Bowel sounds are normal. She exhibits no distension.  Musculoskeletal: Normal range of motion. She exhibits no edema.  Neurological: She is alert and oriented to person, place, and time.  Skin: Skin is dry.  Psychiatric: She has a normal mood and affect. Her behavior is normal. Judgment and thought content normal.      Vital Signs: BP (!) 154/64 (BP Location: Right Arm)   Pulse 67   Temp 98.1 F (36.7 C) (Oral)   Resp 16   Ht  (1.753 m)   Wt 88.1 kg (194 lb 4.8 oz)   SpO2 95%   BMI 28.69 kg/m  SpO2: SpO2: 95 % O2 Device: O2 Device: Nasal Cannula O2 Flow Rate: O2 Flow Rate (L/min): 2 L/min  Intake/output summary:   Intake/Output Summary (Last 24 hours) at 12/08/16 0908 Last data filed at 12/07/16 2100  Gross per 24 hour  Intake              480 ml  Output              200 ml  Net  280 ml   LBM: Last BM Date: 12/04/16 Baseline Weight: Weight: 80.7 kg (178 lb) Most recent weight: Weight: 88.1 kg (194 lb 4.8 oz)  Palliative Assessment/Data: PPS 50-60%    Patient Active Problem List   Diagnosis Date Noted  . Goals of care, counseling/discussion   . Shortness of breath   . Palliative care by specialist   . ILD (interstitial lung disease) (HCC)   . Respiratory failure (HCC) 12/04/2016  . ANA positive 02/09/2016  . Acute bronchiolitis due to respiratory syncytial virus (RSV) 02/08/2016  . Chronic respiratory failure with hypoxia (HCC) 02/06/2016  . HCAP (healthcare-associated pneumonia) 02/06/2016  . 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 Acute respiratory failure with hypoxia (HCC) 01/25/2016  . Hypothyroidism 01/25/2016  . 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  Pure hypercholesterolemia 07/29/2012  . Type 2 diabetes mellitus with complication, without long-term current use of insulin (HCC) 01/16/2010  . HTN (hypertension) 01/16/2010  . Upper airway cough syndrome 01/12/2010    Palliative Care Assessment & Plan   HPI: 76 y.o. female  with past medical history of ILD, HTN, HLD, GERD, and DM2. She initially presented to her PCP with progressive shortness of breath and cough. She then came to the ED after developing acute onset shortness of breath with hypoxia, despite supplemental oxygen. On arrival to the ED she was hypoxic to mid 60's on her home O2 of 2L. She was admitted on 12/04/2016 with acute on chronic respiratory failure attributed to an acute exacerbation of IPF. CT chest on admission did note progression of ILD from imaging in December 2017. Pt elected to be DNR and filled out Advanced Directive paperwork with Chaplain. Palliative consulted to assist in symptom management.  Assessment: Kim [name] has had a marked improvement since admission. Clearly, she is respoinding well to an increase in steroids. In review of Pulmonology's note and belief that she is not endstage, I followed-up on the discharge plan and care needs. At this point, Mrs. Riederer is likely not Hospice eligible. Her prognosis is likely >51mo and, given her response to IV steroids, she intends to continue utilizing hospital level care if she has a recurrent flare. She is open to utilizing home palliative care and resumption of home health after discharge.   In light of her chronic and progressive disease, I did have a good discussion with her about the eventual recurrence of a flare and the uncertainty of when she may encounter a point when she does not improve despite interventions. We talked about the importance of talking with family and ensuring affairs are in order while she has the energy and capacity to do so. She has an excellent grasp on her health issues, and a calm  acceptance surrounding the uncertainty of how long she has. She reiterated her commitment to pursuing good quality of life, which I strongly supported.   Recommendations/Plan:  DNR, plan for medical optimization then discharge home with Palliative Care (NOT Hospice) and resumption of home health.  Message left for CM clarifying discharge plan  Symptoms  Continue Benzonatate to  TID, can change this back to home regimen on discharge  Will d/c morphine as she hasn't needed any and feels she will not need it a this point  Continue senna 1 tab daily to ensure constipation prevention   Code Status:  DNR  Prognosis:   Unable to determine  Discharge Planning:  Home with Home Health and Palliative Care  Care plan was discussed with pt.  Thank you for allowing the Palliative Medicine Team to assist in the care of this patient.  Total time: 35 minutes    Greater than 50%  of this time was spent counseling and coordinating care related to the above assessment and plan.  Murrell Converse, NP Palliative Medicine Team [phone] pager (7a-5p) Team Phone # [phone]

## 2016-12-08 NOTE — Progress Notes (Addendum)
PROGRESS NOTE    Kim [name]  WUJ:[id number] DOB: 1940-05-16 DOA: 12/04/2016 PCP: Renaye Rakers, MD   Brief Narrative: 76 y.o. female with medical history significant of ILD, chronic hypoxia on 2 L of oxygen at home during the rest and up to 6 L with ambulation, HTN, HLD, GERD, DM, sent by her PCP for acute hypoxia with shortness of breath. As per H&P, patient had oxygen saturation of 60s in triage on 2 L. She required nonrebreather and up to 6 L of oxygen. Admitted for further evaluation.  Assessment & Plan:   # Acute on chronic respiratory failure with hypoxia likely due to ILD flare: Chest x-ray with known interstitial lung disease with no acute finding. D-dimer not elevated.  -Patient with clinical improvement today. Lowering Solu-Medrol to twice a day. Requiring about 2-3 L of oxygen and able to speak in full sentences. Ordered PT/OT evaluation and case manager consult for discharge planning. Patient is DNR/DNI. Pulmonary is following. -Continue Atrovent, Xopenex nebulization. Discussed with the respiratory team.  #Diabetes: Continue sliding scale. Monitor blood sugar level especially while on steroid  #Hypertension: Blood pressure acceptable. Continue HCTZ, losartan.  #Hypothyroidism: Continue Synthroid.  #GERD continue Pepcid  # Goals of care discussion: Patient is DNR/DNI. Likely go home with home care services. No home hospice because the lung disease is not end stage as per Pulmonary note from 12/07/2016.  #Leukocytosis likely due to steroid. Monitor CBC.  DVT prophylaxis: Lovenox subcutaneous Code Status: DNR/DNI Family Communication: No family at bedside Disposition Plan: Currently admitted    Consultants:   Pulmonary critical care  Palliative care  Procedures: None Antimicrobials: None  Subjective: Seen and examined at bedside. Reported feeling much better. Able to speak in full sentences. Has minimal cough. No chest pain, nausea  vomiting.  Objective: Vitals:   12/08/16 0343 12/08/16 0736 12/08/16 0753 12/08/16 1040  BP: 138/62  (!) 154/64 (!) 152/68  Pulse: 65  67 90  Resp: (!) 23  16 (!) 21  Temp: 97.9 F (36.6 C)  98.1 F (36.7 C) 98 F (36.7 C)  TempSrc: Oral  Oral Oral  SpO2: 98% 96% 95% 93%  Weight:      Height:        Intake/Output Summary (Last 24 hours) at 12/08/16 1208 Last data filed at 12/08/16 0900  Gross per 24 hour  Intake              840 ml  Output              200 ml  Net              640 ml   Filed Weights   12/04/16 1134 12/05/16 0454 12/05/16 1236  Weight: 80.7 kg (178 lb) 80.7 kg (177 lb 14.6 oz) 88.1 kg (194 lb 4.8 oz)    Examination:  General exam: Not in distress Respiratory system: Good air entry and wheezing is better today. Cardiovascular system: Regular rate rhythm S1-S2 normal. No pedal edema Gastrointestinal system: Abdomen soft, nontender. Bowel sound positive Central nervous system: Alert awake and oriented. No focal neurological deficits. Skin: No rashes, lesions or ulcers Psychiatry: Judgement and insight appear normal. Mood & affect appropriate.     Data Reviewed: I have personally reviewed following labs and imaging studies  CBC:  Recent Labs Lab 12/04/16 1210 12/05/16 0227 12/06/16 0249  WBC 18.2* 12.2* 21.6*  NEUTROABS 9.5*  --   --   HGB 11.3* 10.9* 11.0*  HCT 37.7 35.4* 35.7*  MCV 77.9* 78.1 76.9*  PLT 325 366 373   Basic Metabolic Panel:  Recent Labs Lab 12/04/16 1210 12/05/16 0227 12/06/16 0249  NA 139 136 134*  K 3.4* 4.1 3.9  CL 98* 98* 95*  CO2 GLUCOSE 103* 206* 223*  BUN 11 13 24*  CREATININE 0.98 1.02* 1.19*  CALCIUM 8.9 8.8* 8.7*   GFR: Estimated Creatinine Clearance: 48.4 mL/min (A) (by C-G formula based on SCr of 1.19 mg/dL (H)). Liver Function Tests:  Recent Labs Lab 12/04/16 1210  AST 23  ALT 11*  ALKPHOS 58  BILITOT 0.5  PROT 6.8  ALBUMIN 3.2*   No results for input(s): LIPASE, AMYLASE in the  last 168 hours. No results for input(s): AMMONIA in the last 168 hours. Coagulation Profile: No results for input(s): INR, PROTIME in the last 168 hours. Cardiac Enzymes: No results for input(s): CKTOTAL, CKMB, CKMBINDEX, TROPONINI in the last 168 hours. BNP (last 3 results)  Recent Labs  03/01/16 1537  PROBNP 31.0   HbA1C: No results for input(s): HGBA1C in the last 72 hours. CBG:  Recent Labs Lab 12/07/16 1202 12/07/16 1636 12/07/16 2106 12/08/16 0751 12/08/16 1117  GLUCAP 195* 272* 127* 183* 269*   Lipid Profile: No results for input(s): CHOL, HDL, LDLCALC, TRIG, CHOLHDL, LDLDIRECT in the last 72 hours. Thyroid Function Tests: No results for input(s): TSH, T4TOTAL, FREET4, T3FREE, THYROIDAB in the last 72 hours. Anemia Panel: No results for input(s): VITAMINB12, FOLATE, FERRITIN, TIBC, IRON, RETICCTPCT in the last 72 hours. Sepsis Labs: No results for input(s): PROCALCITON, LATICACIDVEN in the last 168 hours.  Recent Results (from the past 240 hour(s))  Respiratory Panel by PCR     Status: None   Collection Time: 12/05/16  7:19 AM  Result Value Ref Range Status   Adenovirus NOT DETECTED NOT DETECTED Final   Coronavirus 229E NOT DETECTED NOT DETECTED Final   Coronavirus HKU1 NOT DETECTED NOT DETECTED Final   Coronavirus NL63 NOT DETECTED NOT DETECTED Final   Coronavirus OC43 NOT DETECTED NOT DETECTED Final   Metapneumovirus NOT DETECTED NOT DETECTED Final   Rhinovirus / Enterovirus NOT DETECTED NOT DETECTED Final   Influenza A NOT DETECTED NOT DETECTED Final   Influenza B NOT DETECTED NOT DETECTED Final   Parainfluenza Virus 1 NOT DETECTED NOT DETECTED Final   Parainfluenza Virus 2 NOT DETECTED NOT DETECTED Final   Parainfluenza Virus 3 NOT DETECTED NOT DETECTED Final   Parainfluenza Virus 4 NOT DETECTED NOT DETECTED Final   Respiratory Syncytial Virus NOT DETECTED NOT DETECTED Final   Bordetella pertussis NOT DETECTED NOT DETECTED Final   Chlamydophila  pneumoniae NOT DETECTED NOT DETECTED Final   Mycoplasma pneumoniae NOT DETECTED NOT DETECTED Final  MRSA PCR Screening     Status: None   Collection Time: 12/05/16 12:52 PM  Result Value Ref Range Status   MRSA by PCR NEGATIVE NEGATIVE Final    Comment:        The GeneXpert MRSA Assay (FDA approved for NASAL specimens only), is one component of a comprehensive MRSA colonization surveillance program. It is not intended to diagnose MRSA infection nor to guide or monitor treatment for MRSA infections.          Radiology Studies: No results found.      Scheduled Meds: . aspirin EC  81 mg Oral Daily  . benzonatate  200 mg Oral TID  . enoxaparin (LOVENOX) injection  40 mg Subcutaneous Q24H  . famotidine  20 mg Oral  QHS  . guaiFENesin  1,200 mg Oral BID  . hydrochlorothiazide  12.5 mg Oral Daily  . Influenza vac split quadrivalent PF  0.5 mL Intramuscular Tomorrow-1000  . insulin aspart  0-5 Units Subcutaneous QHS  . insulin aspart  0-9 Units Subcutaneous TID WC  . ipratropium  0.5 mg Nebulization TID  . levalbuterol  0.63 mg Nebulization TID  . levothyroxine  75 mcg Oral QAC breakfast  . losartan  100 mg Oral Daily  . methylPREDNISolone (SOLU-MEDROL) injection  60 mg Intravenous Q12H  . pantoprazole  40 mg Oral Q1200  . senna  1 tablet Oral Daily   Continuous Infusions:   LOS: 3 days    Aayla Marrocco Jaynie Collins, MD [facility] Pager [phone]  If 7PM-7AM, please contact night-coverage [email or web address] Password [email or web address] 12/08/2016, 12:08 PM

## 2016-12-09 DIAGNOSIS — R0602 Shortness of breath: Secondary | ICD-10-CM

## 2016-12-09 LAB — GLUCOSE, CAPILLARY
GLUCOSE-CAPILLARY: 179 mg/dL — AB (ref 65–99)
Glucose-Capillary: 150 mg/dL — ABNORMAL HIGH (ref 65–99)

## 2016-12-09 MED ORDER — PREDNISONE 10 MG PO TABS: 40.0000 mg | ORAL_TABLET | Freq: Every day | ORAL | 0 refills | Status: AC

## 2016-12-09 MED ORDER — GUAIFENESIN ER 600 MG PO TB12
1200.0000 mg | ORAL_TABLET | Freq: Two times a day (BID) | ORAL | 0 refills | Status: AC
Start: 2016-12-09 — End: ?

## 2016-12-09 NOTE — Discharge Summary (Signed)
Physician Discharge Summary  Kim [name] ZDG:[id number] DOB: August 30, 1940 DOA: 12/04/2016  PCP: Renaye Rakers, MD  Admit date: 12/04/2016 Discharge date: 12/09/2016  Admitted From:home Disposition:home with home care  Recommendations for Outpatient Follow-up:  1. Follow up with PCP in 1-2 weeks 2. Please obtain BMP/CBC in one week   Home Health:yes Equipment/Devices:oxygen Discharge Condition:stable CODE STATUS:DNR Diet recommendation:carb modified heart healthy diey  Brief/Interim Summary:76 y.o.femalewith medical history significant of ILD, chronic hypoxia on 2 L of oxygen at home during the rest and up to 6 L with ambulation, HTN, HLD, GERD, DM, sent by her PCP for acute hypoxia with shortness of breath. As per H&P, patient had oxygen saturation of 60s in triage on 2 L. She required nonrebreather . Admitted for further evaluation.  # Acute on chronic respiratory failure with hypoxia likely due to ILD flare: Chest x-ray with known interstitial lung disease with no acute finding. D-dimer not elevated.  -the patient was treated with bronchodilators and IV Solu-Medrol with significant clinical improvement. Her oxygen saturation acceptable in 2 liters while resting and required higher with ambulation. Patient takes from 2-6 L of oxygen at home. Evaluated by pulmonologist recommended to discharge with oral prednisone with slow tapering. Patient was recommended to follow-up with pulmonologist and PCP. Resume bronchodilators. Home care services arranged. She was seen by palliative care in the hospital.  #Diabetes: continue oral medication. Monitor blood sugar level.  #Hypertension: continue current anti-hypertensive medications.  #Hypothyroidism: Continue Synthroid.  #GERD continue Pepcid  # Goals of care discussion: Patient is DNR/DNI.   #Leukocytosis likely due to steroid.   Discharge Diagnoses:  Active Problems:   Respiratory failure (HCC)   ILD (interstitial lung  disease) (HCC)   Goals of care, counseling/discussion   SOB (shortness of breath)   Palliative care by specialist    Discharge Instructions  Discharge Instructions    Call MD for:  difficulty breathing, headache or visual disturbances    Complete by:  As directed    Call MD for:  extreme fatigue    Complete by:  As directed    Call MD for:  hives    Complete by:  As directed    Call MD for:  persistant dizziness or light-headedness    Complete by:  As directed    Call MD for:  persistant nausea and vomiting    Complete by:  As directed    Call MD for:  severe uncontrolled pain    Complete by:  As directed    Call MD for:  temperature >100.4    Complete by:  As directed    Diet - low sodium heart healthy    Complete by:  As directed    Diet Carb Modified    Complete by:  As directed    Increase activity slowly    Complete by:  As directed      Allergies as of 12/09/2016      Reactions   Codeine Nausea And Vomiting, Other (See Comments)   "Takes my appetite" also   Penicillins Rash   Has patient had a PCN reaction causing immediate rash, facial/tongue/throat swelling, SOB or lightheadedness with hypotension: Yes Has patient had a PCN reaction causing severe rash involving mucus membranes or skin necrosis: No Has patient had a PCN reaction that required hospitalization No Has patient had a PCN reaction occurring within the last 10 years: No If all of the above answers are "NO", then may proceed with Cephalosporin use.  Medication List    TAKE these medications   acetaminophen 500 MG tablet Commonly known as:  TYLENOL Take 500 mg by mouth every 8 (eight) hours as needed (for pain or headaches). Use per bottle as needed.   aspirin EC 81 MG tablet Take 81 mg by mouth daily.   benzonatate 200 MG capsule Commonly known as:  TESSALON Take 200 mg by mouth every 8 (eight) hours as needed for cough.   famotidine 20 MG tablet Commonly known as:  PEPCID TAKE 1 TABLET  (20 MG TOTAL) BY MOUTH AT BEDTIME.   guaiFENesin 600 MG 12 hr tablet Commonly known as:  MUCINEX Take 2 tablets (1,200 mg total) by mouth 2 (two) times daily.   levothyroxine 75 MCG tablet Commonly known as:  SYNTHROID, LEVOTHROID Take 75 mcg by mouth daily before breakfast.   losartan-hydrochlorothiazide 100-12.5 MG tablet Commonly known as:  HYZAAR Take 1 tablet by mouth daily.   metFORMIN 500 MG tablet Commonly known as:  GLUCOPHAGE TAKE 1 TABLET BY MOUTH TWICE A DAY What changed:  See the new instructions.   OXYGEN Inhale 2-6 L into the lungs See admin instructions. 2 liters at rest and 6 liters with activity   pantoprazole 40 MG tablet Commonly known as:  PROTONIX Take 1 tablet (40 mg total) by mouth daily. Take 30-60 min before first meal of the day   predniSONE 10 MG tablet Commonly known as:  DELTASONE Take 4 tablets (40 mg total) by mouth daily. Take 40 mg for 3 days, 30 mg for 3 days and then 20 mg daily. What changed:  See the new instructions.      Follow-up Information    Nyoka Cowden, MD. Schedule an appointment as soon as possible for a visit in 2 week(s).   Specialty:  Pulmonary Disease Contact information: [address] [place] [address] [phone]        Renaye Rakers, MD. Schedule an appointment as soon as possible for a visit in 1 week(s).   Specialty:  Family Medicine Contact information: [address] [place] [address] [phone]          Allergies  Allergen Reactions  . Codeine Nausea And Vomiting and Other (See Comments)    "Takes my appetite" also  . Penicillins Rash    Has patient had a PCN reaction causing immediate rash, facial/tongue/throat swelling, SOB or lightheadedness with hypotension: Yes Has patient had a PCN reaction causing severe rash involving mucus membranes or skin necrosis: No Has patient had a PCN reaction that required hospitalization No Has patient had a PCN reaction occurring within  the last 10 years: No If all of the above answers are "NO", then may proceed with Cephalosporin use.     Consultations: Palliative care Pulmonologist  Procedures/Studies: none  Subjective: Seen and examined at bedside. Denied headache, dizziness, nausea vomiting chest pain shortness of breath.  Discharge Exam: Vitals:   12/09/16 0731 12/09/16 0732  BP:  (!) 156/59  Pulse:    Resp:  (!) 23  Temp: 98.3 F (36.8 C)   SpO2:  97%   Vitals:   12/09/16 0348 12/09/16 0729 12/09/16 0731 12/09/16 0732  BP:    (!) 156/59  Pulse: 87     Resp:    (!) 23  Temp: 98 F (36.7 C)  98.3 F (36.8 C)   TempSrc: Oral  Oral   SpO2:  98%  97%  Weight:      Height:  General: Pt is alert, awake, not in acute distress Cardiovascular: RRR, S1/S2 +, no rubs, no gallops Respiratory: CTA bilaterally, no wheezing, no rhonchi Abdominal: Soft, NT, ND, bowel sounds + Extremities: no edema, no cyanosis    The results of significant diagnostics from this hospitalization (including imaging, microbiology, ancillary and laboratory) are listed below for reference.     Microbiology: Recent Results (from the past 240 hour(s))  Respiratory Panel by PCR     Status: None   Collection Time: 12/05/16  7:19 AM  Result Value Ref Range Status   Adenovirus NOT DETECTED NOT DETECTED Final   Coronavirus 229E NOT DETECTED NOT DETECTED Final   Coronavirus HKU1 NOT DETECTED NOT DETECTED Final   Coronavirus NL63 NOT DETECTED NOT DETECTED Final   Coronavirus OC43 NOT DETECTED NOT DETECTED Final   Metapneumovirus NOT DETECTED NOT DETECTED Final   Rhinovirus / Enterovirus NOT DETECTED NOT DETECTED Final   Influenza A NOT DETECTED NOT DETECTED Final   Influenza B NOT DETECTED NOT DETECTED Final   Parainfluenza Virus 1 NOT DETECTED NOT DETECTED Final   Parainfluenza Virus 2 NOT DETECTED NOT DETECTED Final   Parainfluenza Virus 3 NOT DETECTED NOT DETECTED Final   Parainfluenza Virus 4 NOT DETECTED NOT  DETECTED Final   Respiratory Syncytial Virus NOT DETECTED NOT DETECTED Final   Bordetella pertussis NOT DETECTED NOT DETECTED Final   Chlamydophila pneumoniae NOT DETECTED NOT DETECTED Final   Mycoplasma pneumoniae NOT DETECTED NOT DETECTED Final  MRSA PCR Screening     Status: None   Collection Time: 12/05/16 12:52 PM  Result Value Ref Range Status   MRSA by PCR NEGATIVE NEGATIVE Final    Comment:        The GeneXpert MRSA Assay (FDA approved for NASAL specimens only), is one component of a comprehensive MRSA colonization surveillance program. It is not intended to diagnose MRSA infection nor to guide or monitor treatment for MRSA infections.      Labs: BNP (last 3 results)  Recent Labs  01/25/16 1341 02/05/16 1913 12/05/16 0227  BNP 51.7 28.4 30.3   Basic Metabolic Panel:  Recent Labs Lab 12/04/16 1210 12/05/16 0227 12/06/16 0249  NA 139 136 134*  K 3.4* 4.1 3.9  CL 98* 98* 95*  CO2 GLUCOSE 103* 206* 223*  BUN 11 13 24*  CREATININE 0.98 1.02* 1.19*  CALCIUM 8.9 8.8* 8.7*   Liver Function Tests:  Recent Labs Lab 12/04/16 1210  AST 23  ALT 11*  ALKPHOS 58  BILITOT 0.5  PROT 6.8  ALBUMIN 3.2*   No results for input(s): LIPASE, AMYLASE in the last 168 hours. No results for input(s): AMMONIA in the last 168 hours. CBC:  Recent Labs Lab 12/04/16 1210 12/05/16 0227 12/06/16 0249  WBC 18.2* 12.2* 21.6*  NEUTROABS 9.5*  --   --   HGB 11.3* 10.9* 11.0*  HCT 37.7 35.4* 35.7*  MCV 77.9* 78.1 76.9*  PLT 325 366 373   Cardiac Enzymes: No results for input(s): CKTOTAL, CKMB, CKMBINDEX, TROPONINI in the last 168 hours. BNP: Invalid input(s): POCBNP CBG:  Recent Labs Lab 12/08/16 0751 12/08/16 1117 12/08/16 1656 12/08/16 2103 12/09/16 0730  GLUCAP 183* 269* 191* 223* 179*   D-Dimer No results for input(s): DDIMER in the last 72 hours. Hgb A1c No results for input(s): HGBA1C in the last 72 hours. Lipid Profile No results for  input(s): CHOL, HDL, LDLCALC, TRIG, CHOLHDL, LDLDIRECT in the last 72 hours. Thyroid function studies No results  for input(s): TSH, T4TOTAL, T3FREE, THYROIDAB in the last 72 hours.  Invalid input(s): FREET3 Anemia work up No results for input(s): VITAMINB12, FOLATE, FERRITIN, TIBC, IRON, RETICCTPCT in the last 72 hours. Urinalysis    Component Value Date/Time   COLORURINE YELLOW 02/05/2016 2233   APPEARANCEUR HAZY (A) 02/05/2016 2233   LABSPEC 1.014 02/05/2016 2233   PHURINE 7.0 02/05/2016 2233   GLUCOSEU NEGATIVE 02/05/2016 2233   HGBUR NEGATIVE 02/05/2016 2233   BILIRUBINUR NEGATIVE 02/05/2016 2233   KETONESUR NEGATIVE 02/05/2016 2233   PROTEINUR NEGATIVE 02/05/2016 2233   UROBILINOGEN 0.2 10/04/2014 0100   NITRITE NEGATIVE 02/05/2016 2233   LEUKOCYTESUR LARGE (A) 02/05/2016 2233   Sepsis Labs Invalid input(s): PROCALCITONIN,  WBC,  LACTICIDVEN Microbiology Recent Results (from the past 240 hour(s))  Respiratory Panel by PCR     Status: None   Collection Time: 12/05/16  7:19 AM  Result Value Ref Range Status   Adenovirus NOT DETECTED NOT DETECTED Final   Coronavirus 229E NOT DETECTED NOT DETECTED Final   Coronavirus HKU1 NOT DETECTED NOT DETECTED Final   Coronavirus NL63 NOT DETECTED NOT DETECTED Final   Coronavirus OC43 NOT DETECTED NOT DETECTED Final   Metapneumovirus NOT DETECTED NOT DETECTED Final   Rhinovirus / Enterovirus NOT DETECTED NOT DETECTED Final   Influenza A NOT DETECTED NOT DETECTED Final   Influenza B NOT DETECTED NOT DETECTED Final   Parainfluenza Virus 1 NOT DETECTED NOT DETECTED Final   Parainfluenza Virus 2 NOT DETECTED NOT DETECTED Final   Parainfluenza Virus 3 NOT DETECTED NOT DETECTED Final   Parainfluenza Virus 4 NOT DETECTED NOT DETECTED Final   Respiratory Syncytial Virus NOT DETECTED NOT DETECTED Final   Bordetella pertussis NOT DETECTED NOT DETECTED Final   Chlamydophila pneumoniae NOT DETECTED NOT DETECTED Final   Mycoplasma pneumoniae NOT  DETECTED NOT DETECTED Final  MRSA PCR Screening     Status: None   Collection Time: 12/05/16 12:52 PM  Result Value Ref Range Status   MRSA by PCR NEGATIVE NEGATIVE Final    Comment:        The GeneXpert MRSA Assay (FDA approved for NASAL specimens only), is one component of a comprehensive MRSA colonization surveillance program. It is not intended to diagnose MRSA infection nor to guide or monitor treatment for MRSA infections.      Time coordinating discharge: 33 minutes  SIGNED:   Maxie Barb, MD  [facility] 12/09/2016, 10:47 AM  If 7PM-7AM, please contact night-coverage [email or web address] Password [email or web address]

## 2016-12-09 NOTE — Evaluation (Addendum)
Physical Therapy Evaluation Patient Details Name: RAILEE BONILLAS MRN: [id number] DOB: 22-Jun-1940 Today's Date: 12/09/2016   History of Present Illness  Pt is a 76 y.o. female admitted for respiratory failure. Past medical history significant for ILD, HTN, HLD, GERD, and DM.  Clinical Impression  Pt admitted with above diagnosis. Pt currently with functional limitations due to the deficits listed below (see PT Problem List). On eval, pt required supervision transfers and ambulation 200 feet without AD. Pt ambulated on 6 L O2 with desat to 87%. Upon return to EOB, pt continued to desat to 72%. Pt instructed in pursed lip breathing. She returned supine in bed with 5-minute recovery time to 91%.  O2 then decreased to 2 L and sats remained at 91%. With further rest, sats increased to 97%. Pt to d/c home today. All further PT needs to be addressed by HHPT.      Follow Up Recommendations Home health PT;Supervision/Assistance - 24 hour    Equipment Recommendations  None recommended by PT (Pt has home O2.)   Recommendations for Other Services       Precautions / Restrictions Precautions Precautions: Other (comment) Precaution Comments: watch O2 sats Restrictions Weight Bearing Restrictions: No      Mobility  Bed Mobility Overal bed mobility: Modified Independent             General bed mobility comments: +rail, increased time and effort  Transfers Overall transfer level: Needs assistance Equipment used: None Transfers: Sit to/from Stand;Stand Pivot Transfers Sit to Stand: Supervision Stand pivot transfers: Supervision          Ambulation/Gait Ambulation/Gait assistance: Supervision Ambulation Distance (Feet): 200 Feet Assistive device: None Gait Pattern/deviations: Step-through pattern;Decreased stride length Gait velocity: WFL Gait velocity interpretation: at or above normal speed for age/gender General Gait Details: Pt on 2 L O2 at rest with sats 93%. O2 increased  to 6 L for ambulation with desat to 87%. Upon return to EOB pt continued desat to 72%. Pt returned to supine with 5-minute recovery time to 91%.  Pt pushed port O2 during ambulation.   Stairs            Wheelchair Mobility    Modified Rankin (Stroke Patients Only)       Balance Overall balance assessment: No apparent balance deficits (not formally assessed)                                           Pertinent Vitals/Pain Pain Assessment: No/denies pain    Home Living Family/patient expects to be discharged to:: Private residence Living Arrangements: Children Available Help at Discharge: Family;Available 24 hours/day Type of Home: House Home Access: Stairs to enter Entrance Stairs-Rails: Right Entrance Stairs-Number of Steps: 4 Home Layout: One level Home Equipment: Cane - single point;Walker - 2 wheels;Bedside commode;Wheelchair - manual Additional Comments: DME belonged to her husband who passed away 3 years ago.    Prior Function Level of Independence: Needs assistance   Gait / Transfers Assistance Needed: needs assist on stairs; walks indoors independently  ADL's / Homemaking Assistance Needed: daughters do all cooking and cleaning; does not drive        Hand Dominance        Extremity/Trunk Assessment   Upper Extremity Assessment Upper Extremity Assessment: Defer to OT evaluation    Lower Extremity Assessment Lower Extremity Assessment: Overall WFL for tasks assessed  Cervical / Trunk Assessment Cervical / Trunk Assessment: Normal  Communication   Communication: No difficulties  Cognition Arousal/Alertness: Awake/alert Behavior During Therapy: WFL for tasks assessed/performed Overall Cognitive Status: Within Functional Limits for tasks assessed                                        General Comments      Exercises     Assessment/Plan    PT Assessment All further PT needs can be met in the next venue of  care  PT Problem List Decreased activity tolerance;Decreased mobility;Cardiopulmonary status limiting activity       PT Treatment Interventions Gait training;Stair training;Functional mobility training;Therapeutic exercise;Therapeutic activities;Patient/family education    PT Goals (Current goals can be found in the Care Plan section)  Acute Rehab PT Goals Patient Stated Goal: home PT Goal Formulation: All assessment and education complete, DC therapy Time For Goal Achievement: 12/23/16 Potential to Achieve Goals: Good    Frequency Min 3X/week   Barriers to discharge        Co-evaluation               AM-PAC PT "6 Clicks" Daily Activity  Outcome Measure Difficulty turning over in bed (including adjusting bedclothes, sheets and blankets)?: None Difficulty moving from lying on back to sitting on the side of the bed? : None Difficulty sitting down on and standing up from a chair with arms (e.g., wheelchair, bedside commode, etc,.)?: None Help needed moving to and from a bed to chair (including a wheelchair)?: None Help needed walking in hospital room?: None Help needed climbing 3-5 steps with a railing? : A Little 6 Click Score: 23    End of Session Equipment Utilized During Treatment: Gait belt;Oxygen Activity Tolerance: Patient tolerated treatment well Patient left: in bed;with call bell/phone within reach Nurse Communication: Mobility status PT Visit Diagnosis: Difficulty in walking, not elsewhere classified (R26.2)    Time: 4356-8616 PT Time Calculation (min) (ACUTE ONLY): 21 min   Charges:   PT Evaluation $PT Eval Moderate Complexity: 1 Mod     PT G Codes:        Lorrin Goodell, PT  Office # [phone] Pager [phone]   Lorriane Shire 12/09/2016, 9:30 AM

## 2016-12-09 NOTE — Care Management Note (Addendum)
Case Management Note  Patient Details  Name: Kim [name] MRN: [id number] Date of Birth: 10/20/1940  Subjective/Objective:       Pt admitted with hypoxia and chronic ILD             Action/Plan:  PTA independent from home with daughter.  Pt states she uses a walker in the home and has a wheelchair available if needed.  Pt is on home oxygen 2-6 liters dependent upon exertion supplied by [facility].  Family will bring portable tank to [facility] for transport home.  Palliative Care Consult placed per chronic disease with progression.  CM will continue to follow for discharge needs   Expected Discharge Date:  12/09/16               Expected Discharge Plan:  Home/Self Care ([facility])  In-House Referral:     Discharge planning Services  CM Consult  Post Acute Care Choice:    Choice offered to:  Patient  DME Arranged:  Oxygen DME Agency:  [facility]  HH Arranged:  RN, PT, OT, Nurse's Aide, Social Work [employer] Agency:  [facility]  Status of Service:  In process, will continue to follow  If discussed at Long Length of Stay Meetings, dates discussed:    Additional Comments: ,12/09/2016  Pt is in agreement for Palliative to be provided by [facility] as they will accept pts PCP ordered.  CM spoke with Dr Lora Havens nurse Roanna Raider and the office will fax over home palliative consult once power is restored.  CM offered HH agency choice - [facility] chosen - agency contacted and referral accepted.  Portable oxygen tank in the room for transport home  CM contacted by [facility] - unfortunately pt is not going to be accepted in the program because she is not active with [facility].    Great News!!!!  Pt has dramatically improved medically since Friday.  Pt is no longer appropriate for hospice discharge plan has changed to Palliative in the home.  CM provided pt choice of agency - pt chose [facility] - agency contacted - Drenda Freeze with the agency  accepted referral. Cherylann Parr, RN 12/09/2016, 11:33 AM

## 2016-12-09 NOTE — Evaluation (Signed)
Occupational Therapy Evaluation and Discharge Patient Details Name: Kim [name] MRN: [id number] DOB: 01/11/1941 Today's Date: 12/09/2016    History of Present Illness Pt is a 76 y.o. female admitted for respiratory failure. Past medical history significant for ILD, HTN, HLD, GERD, and DM.   Clinical Impression   PTA, pt was independent with ADL and functional mobility. She lives with her daughter who is available to assist intermittently although she works during the day. Pt reports use of 2L.min supplemental O2 at rest and 6L during activity at home. This session pt was able to ambulate to the sink for ADL tasks with supervision with momentary O2 desaturation on 6L/min to 86% with rebound to 93% with pursed lip breathing techniques. Pt currently able to complete functional mobility and basic ADL with overall supervision. Educated pt and daughter concerning energy conservation strategies with handout provided and they demonstrate understanding. All further OT needs may be met at next venue of care as recommended below. Acute OT will sign off.     Follow Up Recommendations  Supervision - Intermittent;Home health OT    Equipment Recommendations       Recommendations for Other Services       Precautions / Restrictions Precautions Precautions: Other (comment) Precaution Comments: watch O2 sats Restrictions Weight Bearing Restrictions: No      Mobility Bed Mobility Overal bed mobility: Modified Independent                Transfers Overall transfer level: Needs assistance Equipment used: None Transfers: Sit to/from Stand;Stand Pivot Transfers Sit to Stand: Supervision Stand pivot transfers: Supervision            Balance Overall balance assessment: No apparent balance deficits (not formally assessed)                                         ADL either performed or assessed with clinical judgement   ADL Overall ADL's : Needs  assistance/impaired Eating/Feeding: Set up;Sitting   Grooming: Supervision/safety;Standing   Upper Body Bathing: Set up;Sitting   Lower Body Bathing: Supervison/ safety;Sit to/from stand   Upper Body Dressing : Set up;Sitting   Lower Body Dressing: Supervision/safety;Sit to/from stand   Toilet Transfer: Supervision/safety;Ambulation;Regular Toilet   Toileting- Water quality scientist and Hygiene: Supervision/safety;Sit to/from stand       Functional mobility during ADLs: Supervision/safety General ADL Comments: Educated pt pn energy conservation strategies with handout provided.     Vision Patient Visual Report: No change from baseline Vision Assessment?: No apparent visual deficits     Perception     Praxis      Pertinent Vitals/Pain Pain Assessment: No/denies pain     Hand Dominance     Extremity/Trunk Assessment Upper Extremity Assessment Upper Extremity Assessment: Overall WFL for tasks assessed   Lower Extremity Assessment Lower Extremity Assessment: Overall WFL for tasks assessed   Cervical / Trunk Assessment Cervical / Trunk Assessment: Normal   Communication Communication Communication: No difficulties   Cognition Arousal/Alertness: Awake/alert Behavior During Therapy: WFL for tasks assessed/performed Overall Cognitive Status: Within Functional Limits for tasks assessed                                     General Comments  Momentary desaturation to 86% while ambulating to sink on 6L O2 with rebound to 93%  with pursed lip breathing strategies. Pt reports keeping O2 at 2L if seated at home and 6L with activity.     Exercises     Shoulder Instructions      Home Living Family/patient expects to be discharged to:: Private residence Living Arrangements: Children Available Help at Discharge: Family;Available 24 hours/day Type of Home: House Home Access: Stairs to enter [employer] of Steps: 4 Entrance Stairs-Rails:  Right Home Layout: One level     Bathroom Shower/Tub: Teacher, early years/pre: Standard     Home Equipment: Cane - single point;Walker - 2 wheels;Bedside commode;Wheelchair - manual;Shower seat   Additional Comments: DME belonged to her husband who passed away 3 years ago. She does use shower seat.      Prior Functioning/Environment Level of Independence: Needs assistance  Gait / Transfers Assistance Needed: needs assist on stairs; walks indoors independently ADL's / Homemaking Assistance Needed: daughters do all cooking and cleaning; does not drive            OT Problem List: Decreased activity tolerance;Impaired balance (sitting and/or standing);Decreased safety awareness;Decreased knowledge of use of DME or AE;Decreased knowledge of precautions;Cardiopulmonary status limiting activity      OT Treatment/Interventions:      OT Goals(Current goals can be found in the care plan section) Acute Rehab OT Goals Patient Stated Goal: home today OT Goal Formulation: With patient/family Time For Goal Achievement: 12/23/16 Potential to Achieve Goals: Good  OT Frequency:     Barriers to D/C:            Co-evaluation              AM-PAC PT "6 Clicks" Daily Activity     Outcome Measure Help from another person eating meals?: None Help from another person taking care of personal grooming?: A Little Help from another person toileting, which includes using toliet, bedpan, or urinal?: A Little Help from another person bathing (including washing, rinsing, drying)?: A Little Help from another person to put on and taking off regular upper body clothing?: None Help from another person to put on and taking off regular lower body clothing?: A Little 6 Click Score: 20   End of Session Equipment Utilized During Treatment: Oxygen (2L/min and 6L/min)  Activity Tolerance: Patient tolerated treatment well Patient left: in bed;with family/visitor present;with call bell/phone  within reach  OT Visit Diagnosis: Unsteadiness on feet (R26.81);Other abnormalities of gait and mobility (R26.89)                Time: 8828-0034 OT Time Calculation (min): 16 min Charges:  OT General Charges $OT Visit: 1 Visit OT Evaluation $OT Eval Moderate Complexity: 1 Mod G-Codes:     Norman Herrlich, MS OTR/L  Pager: Ayden A Jillana Selph 12/09/2016, 1:56 PM

## 2016-12-16 ENCOUNTER — Other Ambulatory Visit: Payer: Self-pay | Admitting: Internal Medicine

## 2016-12-23 ENCOUNTER — Ambulatory Visit: Payer: Medicare Other | Admitting: Internal Medicine

## 2016-12-26 ENCOUNTER — Ambulatory Visit: Payer: Medicare Other | Admitting: Internal Medicine

## 2016-12-26 ENCOUNTER — Inpatient Hospital Stay (HOSPITAL_COMMUNITY)
Admission: EM | Admit: 2016-12-26 | Discharge: 2017-01-25 | DRG: 196 | Disposition: E | Payer: Medicare Other | Attending: Internal Medicine | Admitting: Internal Medicine

## 2016-12-26 ENCOUNTER — Encounter (HOSPITAL_COMMUNITY): Payer: Self-pay

## 2016-12-26 ENCOUNTER — Emergency Department (HOSPITAL_COMMUNITY): Payer: Medicare Other

## 2016-12-26 DIAGNOSIS — I129 Hypertensive chronic kidney disease with stage 1 through stage 4 chronic kidney disease, or unspecified chronic kidney disease: Secondary | ICD-10-CM | POA: Diagnosis present

## 2016-12-26 DIAGNOSIS — R6510 Systemic inflammatory response syndrome (SIRS) of non-infectious origin without acute organ dysfunction: Secondary | ICD-10-CM | POA: Diagnosis present

## 2016-12-26 DIAGNOSIS — E785 Hyperlipidemia, unspecified: Secondary | ICD-10-CM | POA: Diagnosis present

## 2016-12-26 DIAGNOSIS — J849 Interstitial pulmonary disease, unspecified: Secondary | ICD-10-CM

## 2016-12-26 DIAGNOSIS — E1122 Type 2 diabetes mellitus with diabetic chronic kidney disease: Secondary | ICD-10-CM | POA: Diagnosis present

## 2016-12-26 DIAGNOSIS — Z885 Allergy status to narcotic agent status: Secondary | ICD-10-CM | POA: Diagnosis not present

## 2016-12-26 DIAGNOSIS — R197 Diarrhea, unspecified: Secondary | ICD-10-CM | POA: Diagnosis present

## 2016-12-26 DIAGNOSIS — A419 Sepsis, unspecified organism: Secondary | ICD-10-CM | POA: Diagnosis not present

## 2016-12-26 DIAGNOSIS — I10 Essential (primary) hypertension: Secondary | ICD-10-CM | POA: Diagnosis not present

## 2016-12-26 DIAGNOSIS — J9621 Acute and chronic respiratory failure with hypoxia: Secondary | ICD-10-CM | POA: Diagnosis present

## 2016-12-26 DIAGNOSIS — Z79899 Other long term (current) drug therapy: Secondary | ICD-10-CM | POA: Diagnosis not present

## 2016-12-26 DIAGNOSIS — E118 Type 2 diabetes mellitus with unspecified complications: Secondary | ICD-10-CM

## 2016-12-26 DIAGNOSIS — Z87891 Personal history of nicotine dependence: Secondary | ICD-10-CM

## 2016-12-26 DIAGNOSIS — Z7952 Long term (current) use of systemic steroids: Secondary | ICD-10-CM | POA: Diagnosis not present

## 2016-12-26 DIAGNOSIS — J8410 Pulmonary fibrosis, unspecified: Secondary | ICD-10-CM | POA: Diagnosis present

## 2016-12-26 DIAGNOSIS — Z880 Allergy status to penicillin: Secondary | ICD-10-CM

## 2016-12-26 DIAGNOSIS — J810 Acute pulmonary edema: Secondary | ICD-10-CM | POA: Diagnosis not present

## 2016-12-26 DIAGNOSIS — J9690 Respiratory failure, unspecified, unspecified whether with hypoxia or hypercapnia: Secondary | ICD-10-CM

## 2016-12-26 DIAGNOSIS — J9601 Acute respiratory failure with hypoxia: Secondary | ICD-10-CM | POA: Diagnosis not present

## 2016-12-26 DIAGNOSIS — K219 Gastro-esophageal reflux disease without esophagitis: Secondary | ICD-10-CM | POA: Diagnosis present

## 2016-12-26 DIAGNOSIS — E039 Hypothyroidism, unspecified: Secondary | ICD-10-CM | POA: Diagnosis present

## 2016-12-26 DIAGNOSIS — R682 Dry mouth, unspecified: Secondary | ICD-10-CM | POA: Diagnosis not present

## 2016-12-26 DIAGNOSIS — N182 Chronic kidney disease, stage 2 (mild): Secondary | ICD-10-CM | POA: Diagnosis not present

## 2016-12-26 DIAGNOSIS — R0602 Shortness of breath: Secondary | ICD-10-CM | POA: Diagnosis not present

## 2016-12-26 DIAGNOSIS — Z96651 Presence of right artificial knee joint: Secondary | ICD-10-CM | POA: Diagnosis present

## 2016-12-26 DIAGNOSIS — Z91048 Other nonmedicinal substance allergy status: Secondary | ICD-10-CM

## 2016-12-26 DIAGNOSIS — J811 Chronic pulmonary edema: Secondary | ICD-10-CM | POA: Diagnosis present

## 2016-12-26 DIAGNOSIS — R0902 Hypoxemia: Secondary | ICD-10-CM | POA: Diagnosis not present

## 2016-12-26 DIAGNOSIS — Z515 Encounter for palliative care: Secondary | ICD-10-CM

## 2016-12-26 DIAGNOSIS — D509 Iron deficiency anemia, unspecified: Secondary | ICD-10-CM | POA: Diagnosis present

## 2016-12-26 DIAGNOSIS — Z7984 Long term (current) use of oral hypoglycemic drugs: Secondary | ICD-10-CM

## 2016-12-26 DIAGNOSIS — R112 Nausea with vomiting, unspecified: Secondary | ICD-10-CM | POA: Diagnosis present

## 2016-12-26 DIAGNOSIS — E1129 Type 2 diabetes mellitus with other diabetic kidney complication: Secondary | ICD-10-CM

## 2016-12-26 DIAGNOSIS — J84112 Idiopathic pulmonary fibrosis: Secondary | ICD-10-CM | POA: Diagnosis not present

## 2016-12-26 DIAGNOSIS — Z66 Do not resuscitate: Secondary | ICD-10-CM | POA: Diagnosis present

## 2016-12-26 DIAGNOSIS — Z7189 Other specified counseling: Secondary | ICD-10-CM | POA: Diagnosis not present

## 2016-12-26 DIAGNOSIS — Z9981 Dependence on supplemental oxygen: Secondary | ICD-10-CM

## 2016-12-26 DIAGNOSIS — Z7982 Long term (current) use of aspirin: Secondary | ICD-10-CM | POA: Diagnosis not present

## 2016-12-26 LAB — CBC WITH DIFFERENTIAL/PLATELET
BASOS ABS: 0 10*3/uL (ref 0.0–0.1)
BASOS PCT: 0 %
Eosinophils Absolute: 0 10*3/uL (ref 0.0–0.7)
Eosinophils Relative: 0 %
HEMATOCRIT: 36.9 % (ref 36.0–46.0)
Hemoglobin: 11.3 g/dL — ABNORMAL LOW (ref 12.0–15.0)
LYMPHS PCT: 7 %
Lymphs Abs: 0.9 10*3/uL (ref 0.7–4.0)
MCH: 23.8 pg — ABNORMAL LOW (ref 26.0–34.0)
MCHC: 30.6 g/dL (ref 30.0–36.0)
MCV: 77.7 fL — AB (ref 78.0–100.0)
MONO ABS: 0.2 10*3/uL (ref 0.1–1.0)
Monocytes Relative: 2 %
NEUTROS ABS: 12 10*3/uL — AB (ref 1.7–7.7)
NEUTROS PCT: 91 %
Platelets: 328 10*3/uL (ref 150–400)
RBC: 4.75 MIL/uL (ref 3.87–5.11)
RDW: 16.6 % — AB (ref 11.5–15.5)
WBC: 13.2 10*3/uL — ABNORMAL HIGH (ref 4.0–10.5)

## 2016-12-26 LAB — I-STAT ARTERIAL BLOOD GAS, ED
ACID-BASE DEFICIT: 5 mmol/L — AB (ref 0.0–2.0)
BICARBONATE: 20.5 mmol/L (ref 20.0–28.0)
O2 Saturation: 99 %
TCO2: 22 mmol/L (ref 22–32)
pCO2 arterial: 41 mmHg (ref 32.0–48.0)
pH, Arterial: 7.307 — ABNORMAL LOW (ref 7.350–7.450)
pO2, Arterial: 179 mmHg — ABNORMAL HIGH (ref 83.0–108.0)

## 2016-12-26 LAB — COMPREHENSIVE METABOLIC PANEL
ALBUMIN: 3 g/dL — AB (ref 3.5–5.0)
ALT: 13 U/L — AB (ref 14–54)
AST: 18 U/L (ref 15–41)
Alkaline Phosphatase: 67 U/L (ref 38–126)
Anion gap: 11 (ref 5–15)
BILIRUBIN TOTAL: 0.5 mg/dL (ref 0.3–1.2)
BUN: 14 mg/dL (ref 6–20)
CHLORIDE: 96 mmol/L — AB (ref 101–111)
CO2: 28 mmol/L (ref 22–32)
Calcium: 8.8 mg/dL — ABNORMAL LOW (ref 8.9–10.3)
Creatinine, Ser: 1.13 mg/dL — ABNORMAL HIGH (ref 0.44–1.00)
GFR calc Af Amer: 54 mL/min — ABNORMAL LOW (ref >60)
GFR, EST NON AFRICAN AMERICAN: 46 mL/min — AB (ref >60)
Glucose, Bld: 196 mg/dL — ABNORMAL HIGH (ref 65–99)
POTASSIUM: 4.4 mmol/L (ref 3.5–5.1)
Sodium: 135 mmol/L (ref 135–145)
Total Protein: 6.4 g/dL — ABNORMAL LOW (ref 6.5–8.1)

## 2016-12-26 LAB — I-STAT TROPONIN, ED: TROPONIN I, POC: 0 ng/mL (ref 0.00–0.08)

## 2016-12-26 LAB — D-DIMER, QUANTITATIVE (NOT AT ARMC): D DIMER QUANT: 0.62 ug{FEU}/mL — AB (ref 0.00–0.50)

## 2016-12-26 LAB — LACTIC ACID, PLASMA: Lactic Acid, Venous: 3.8 mmol/L (ref 0.5–1.9)

## 2016-12-26 LAB — LIPASE, BLOOD: LIPASE: 23 U/L (ref 11–51)

## 2016-12-26 MED ORDER — INSULIN ASPART 100 UNIT/ML ~~LOC~~ SOLN
0.0000 [IU] | Freq: Three times a day (TID) | SUBCUTANEOUS | Status: DC
  Administered 2016-12-27 – 2016-12-28 (×3): 2 [IU] via SUBCUTANEOUS
  Administered 2016-12-28: 3 [IU] via SUBCUTANEOUS
  Administered 2016-12-28 – 2016-12-29 (×2): 1 [IU] via SUBCUTANEOUS
  Administered 2016-12-29 – 2016-12-30 (×2): 2 [IU] via SUBCUTANEOUS
  Administered 2016-12-30 (×2): 1 [IU] via SUBCUTANEOUS
  Administered 2016-12-31: 2 [IU] via SUBCUTANEOUS
  Administered 2016-12-31: 3 [IU] via SUBCUTANEOUS
  Administered 2016-12-31 – 2017-01-01 (×3): 2 [IU] via SUBCUTANEOUS
  Administered 2017-01-01 – 2017-01-02 (×2): 3 [IU] via SUBCUTANEOUS

## 2016-12-26 MED ORDER — METHYLPREDNISOLONE SODIUM SUCC 125 MG IJ SOLR
125.0000 mg | Freq: Once | INTRAMUSCULAR | Status: AC
  Administered 2016-12-26: 125 mg via INTRAVENOUS
  Filled 2016-12-26: qty 2

## 2016-12-26 MED ORDER — LORAZEPAM 2 MG/ML IJ SOLN
0.5000 mg | Freq: Once | INTRAMUSCULAR | Status: AC
  Administered 2016-12-26: 0.5 mg via INTRAVENOUS
  Filled 2016-12-26: qty 1

## 2016-12-26 MED ORDER — GUAIFENESIN ER 600 MG PO TB12
1200.0000 mg | ORAL_TABLET | Freq: Two times a day (BID) | ORAL | Status: DC
  Administered 2016-12-26 – 2016-12-29 (×6): 1200 mg via ORAL
  Filled 2016-12-26 (×6): qty 2

## 2016-12-26 MED ORDER — AZITHROMYCIN 250 MG PO TABS
500.0000 mg | ORAL_TABLET | Freq: Every day | ORAL | Status: AC
  Administered 2016-12-26: 500 mg via ORAL
  Filled 2016-12-26: qty 2

## 2016-12-26 MED ORDER — LOSARTAN POTASSIUM-HCTZ 100-12.5 MG PO TABS: 1.0000 | ORAL_TABLET | Freq: Every day | ORAL | Status: DC

## 2016-12-26 MED ORDER — FAMOTIDINE 20 MG PO TABS
20.0000 mg | ORAL_TABLET | Freq: Every day | ORAL | Status: DC
  Administered 2016-12-26 – 2016-12-29 (×4): 20 mg via ORAL
  Filled 2016-12-26 (×4): qty 1

## 2016-12-26 MED ORDER — HYDRALAZINE HCL 20 MG/ML IJ SOLN
5.0000 mg | INTRAMUSCULAR | Status: DC | PRN
  Administered 2016-12-30 – 2016-12-31 (×2): 5 mg via INTRAVENOUS
  Filled 2016-12-26 (×2): qty 1

## 2016-12-26 MED ORDER — SODIUM CHLORIDE 0.9 % IV BOLUS (SEPSIS)
1000.0000 mL | Freq: Once | INTRAVENOUS | Status: AC
  Administered 2016-12-26: 1000 mL via INTRAVENOUS

## 2016-12-26 MED ORDER — METHYLPREDNISOLONE SODIUM SUCC 125 MG IJ SOLR
125.0000 mg | Freq: Three times a day (TID) | INTRAMUSCULAR | Status: DC
  Administered 2016-12-26 – 2016-12-29 (×9): 125 mg via INTRAVENOUS
  Filled 2016-12-26 (×9): qty 2

## 2016-12-26 MED ORDER — ZOLPIDEM TARTRATE 5 MG PO TABS: 5.0000 mg | ORAL_TABLET | Freq: Every evening | ORAL | Status: DC | PRN

## 2016-12-26 MED ORDER — ACETAMINOPHEN 500 MG PO TABS: 500.0000 mg | ORAL_TABLET | Freq: Three times a day (TID) | ORAL | Status: DC | PRN

## 2016-12-26 MED ORDER — LEVALBUTEROL HCL 1.25 MG/0.5ML IN NEBU: 1.2500 mg | INHALATION_SOLUTION | Freq: Four times a day (QID) | RESPIRATORY_TRACT | Status: DC

## 2016-12-26 MED ORDER — ONDANSETRON HCL 4 MG/2ML IJ SOLN: 4.0000 mg | Freq: Three times a day (TID) | INTRAMUSCULAR | Status: DC | PRN

## 2016-12-26 MED ORDER — INSULIN ASPART 100 UNIT/ML ~~LOC~~ SOLN
0.0000 [IU] | Freq: Every day | SUBCUTANEOUS | Status: DC
  Administered 2016-12-27: 3 [IU] via SUBCUTANEOUS
  Administered 2016-12-28: 4 [IU] via SUBCUTANEOUS
  Administered 2016-12-30: 3 [IU] via SUBCUTANEOUS
  Administered 2016-12-31: 2 [IU] via SUBCUTANEOUS

## 2016-12-26 MED ORDER — SODIUM CHLORIDE 0.9 % IV SOLN
INTRAVENOUS | Status: DC
  Administered 2016-12-26 – 2016-12-27 (×2): via INTRAVENOUS

## 2016-12-26 MED ORDER — LEVOTHYROXINE SODIUM 75 MCG PO TABS
75.0000 ug | ORAL_TABLET | Freq: Every day | ORAL | Status: DC
  Administered 2016-12-27 – 2016-12-28 (×2): 75 ug via ORAL
  Filled 2016-12-26 (×2): qty 1

## 2016-12-26 MED ORDER — AZITHROMYCIN 500 MG PO TABS
250.0000 mg | ORAL_TABLET | Freq: Every day | ORAL | Status: DC
  Administered 2016-12-27 – 2016-12-28 (×2): 250 mg via ORAL
  Filled 2016-12-26 (×2): qty 1

## 2016-12-26 MED ORDER — ASPIRIN EC 81 MG PO TBEC
81.0000 mg | DELAYED_RELEASE_TABLET | Freq: Every day | ORAL | Status: DC
  Administered 2016-12-27 – 2016-12-28 (×2): 81 mg via ORAL
  Filled 2016-12-26 (×3): qty 1

## 2016-12-26 MED ORDER — ENOXAPARIN SODIUM 40 MG/0.4ML ~~LOC~~ SOLN
40.0000 mg | SUBCUTANEOUS | Status: DC
  Administered 2016-12-27 – 2017-01-01 (×6): 40 mg via SUBCUTANEOUS
  Filled 2016-12-26 (×7): qty 0.4

## 2016-12-26 MED ORDER — IPRATROPIUM-ALBUTEROL 0.5-2.5 (3) MG/3ML IN SOLN
3.0000 mL | RESPIRATORY_TRACT | Status: AC
  Administered 2016-12-26 (×3): 3 mL via RESPIRATORY_TRACT
  Filled 2016-12-26: qty 3
  Filled 2016-12-26: qty 9

## 2016-12-26 MED ORDER — IPRATROPIUM BROMIDE 0.02 % IN SOLN
0.5000 mg | RESPIRATORY_TRACT | Status: DC
  Administered 2016-12-26 – 2016-12-27 (×4): 0.5 mg via RESPIRATORY_TRACT
  Filled 2016-12-26 (×4): qty 2.5

## 2016-12-26 NOTE — H&P (Addendum)
History and Physical    Kim [name] [id number] DOB: 1941/02/07 DOA: 12/27/2016  Referring MD/NP/PA:   PCP: Renaye RakersBland, Veita, MD   Patient coming from:  The patient is coming from home.  At baseline, pt is partially dependent for most of ADL.   Chief Complaint: Worsening SOB  HPI: Kim [name] is a 76 y.o. female with medical history significant of pulmonary fibrosis, chronic respiratory failure on 2-4 L oxygen at home, hypertension, hyperlipidemia, diabetes mellitus, GERD, hypothyroidism, CKD-2, who presents with worsening shortness of breath.  Patient states that she has been having worsening SOB in the past several days, which has been progressively getting worse. She has cough with clear mucus production. She has chills, and no fever. Patient does not have chest pain, tenderness in calf areas. Patient states that she has nausea, and vomited yesterday, but no vomiting today. She has mild diarrhea. She has one loose stool bowel movement today. Denies any abdominal pain. Denies symptoms of UTI or unilateral weakness. Patient can barely speak in full sentence and uses extra respiratory muscles to breathe.  ED Course: pt was found to have WBC 13.2, negative troponin, lipase 23, d-dimer 0.62, stable renal function, temperature normal, tachycardia, tachypnea, O2 Sat 94% on 12L nasal cannula oxygen. Chest x-ray showed stable chronic interstitial disease without infiltration. Patient is admitted to stepdown as inpatient. BiPAP was started.  Review of Systems:   General: no fevers, chills, no body weight gain, has poor appetite, has fatigue HEENT: no blurry vision, hearing changes or sore throat Respiratory: has dyspnea, coughing, no wheezing CV: no chest pain, no palpitations GI: has nausea, vomiting, abdominal pain, diarrhea GU: no dysuria, burning on urination, increased urinary frequency, hematuria  Ext: no leg edema Neuro: no unilateral weakness, numbness, or tingling, no vision  change or hearing loss Skin: no rash, no skin tear. MSK: No muscle spasm, no deformity, no limitation of range of movement in spin Heme: No easy bruising.  Travel history: No recent long distant travel.  Allergy:  Allergies  Allergen Reactions  . Codeine Nausea And Vomiting and Other (See Comments)    "Takes my appetite" also  . Penicillins Rash    Has patient had a PCN reaction causing immediate rash, facial/tongue/throat swelling, SOB or lightheadedness with hypotension: Yes Has patient had a PCN reaction causing severe rash involving mucus membranes or skin necrosis: No Has patient had a PCN reaction that required hospitalization No Has patient had a PCN reaction occurring within the last 10 years: No If all of the above answers are "NO", then may proceed with Cephalosporin use.     Past Medical History:  Diagnosis Date  . Acute bronchiolitis due to respiratory syncytial virus (RSV) 02/08/2016  . ANA positive 02/09/2016  . Constipation due to pain medication   . Diabetes mellitus without complication (HCC)   . GERD (gastroesophageal reflux disease)   . Hyperlipemia   . Hypertension   . 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 Postinflammatory pulmonary fibrosis (HCC)   . Seasonal allergies     Past Surgical History:  Procedure Laterality Date  . ABDOMINAL HYSTERECTOMY    . COLONOSCOPY    . HERNIA REPAIR     X 2  . SMALL INTESTINE SURGERY    . TOTAL KNEE ARTHROPLASTY Right 07/06/2014   Procedure: RIGHT TOTAL KNEE ARTHROPLASTY;  Surgeon: Mckinley Jewelaniel Murphy, MD;  [facility];  Service: Orthopedics;  Laterality: Right;    Social History:  reports that she quit smoking about 28 years  ago. Her smoking use included Cigarettes. She has a 5.00 pack-year smoking history. She has never used smokeless tobacco. She reports that she does not drink alcohol or use drugs.  Family History:  Family History  Problem Relation Age of Onset  . Stomach cancer Sister   . Heart attack Sister       Prior to Admission medications   Medication Sig Start Date End Date Taking? Authorizing Provider  acetaminophen (TYLENOL) 500 MG tablet Take 500 mg by mouth every 8 (eight) hours as needed (for pain or headaches). Use per bottle as needed.   Yes [provider]  aspirin EC 81 MG tablet Take 81 mg by mouth daily.   Yes [provider]  benzonatate (TESSALON) 200 MG capsule Take 200 mg by mouth every 8 (eight) hours as needed for cough.   Yes [provider]  famotidine (PEPCID) 20 MG tablet TAKE 1 TABLET (20 MG TOTAL) BY MOUTH AT BEDTIME. 07/04/16  Yes Nyoka Cowden, MD  guaiFENesin (MUCINEX) 600 MG 12 hr tablet Take 2 tablets (1,200 mg total) by mouth 2 (two) times daily. 12/09/16  Yes Maxie Barb, MD  levothyroxine (SYNTHROID, LEVOTHROID) 75 MCG tablet Take 75 mcg by mouth daily before breakfast.   Yes [provider]  losartan-hydrochlorothiazide (HYZAAR) 100-12.5 MG per tablet Take 1 tablet by mouth daily.   Yes [provider]  metFORMIN (GLUCOPHAGE) 500 MG tablet TAKE 1 TABLET BY MOUTH TWICE A DAY Patient taking differently: Take 500 mg by mouth two times a day 03/31/13  Yes Worthy Rancher B, FNP  OXYGEN Inhale 2-6 L into the lungs See admin instructions. 2 liters at rest and 6 liters with activity   Yes [provider]  predniSONE (DELTASONE) 10 MG tablet Take 4 tablets (40 mg total) by mouth daily. Take 40 mg for 3 days, 30 mg for 3 days and then 20 mg daily. 12/09/16  Yes Maxie Barb, MD  pantoprazole (PROTONIX) 40 MG tablet Take 1 tablet (40 mg total) by mouth daily. Take 30-60 min before first meal of the day Patient not taking: Reported on 12/04/2016 08/22/16   Nyoka Cowden, MD  predniSONE (DELTASONE) 10 MG tablet TAKE 1 & 1/2 TABLET EVERY DAY WITH BREAKFAST 12/16/16   Nyoka Cowden, MD    Physical Exam: Vitals:   2017/01/14 1746 2017-01-14 1815 2017-01-14 2015 January 14, 2017 2100  BP: 137/72 123/81 (!) 144/67 138/65    Pulse: (!) 120 (!) 123 (!) 124 (!) 114  Resp: (!) 43 (!) 39 (!) 44 (!) 42  Temp:      TempSrc:      SpO2: 100% 100% 94% 97%   General: In acute respiratory distress HEENT:       Eyes: PERRL, EOMI, no scleral icterus.       ENT: No discharge from the ears and nose, no pharynx injection, no tonsillar enlargement.        Neck: No JVD, no bruit, no mass felt. Heme: No neck lymph node enlargement. Cardiac: S1/S2, RRR, No murmurs, No gallops or rubs. Respiratory: Decreased air movement bilaterally. No rales, wheezing, rhonchi or rubs. GI: Soft, nondistended, nontender, no rebound pain, no organomegaly, BS present. GU: No hematuria Ext: No pitting leg edema bilaterally. 2+DP/PT pulse bilaterally. Musculoskeletal: No joint deformities, No joint redness or warmth, no limitation of ROM in spin. Skin: No rashes.  Neuro: Alert, oriented X3, cranial nerves II-XII grossly intact, moves all extremities normally.  Psych: Patient is not psychotic,  no suicidal or hemocidal ideation.  Labs on Admission: I have personally reviewed following labs and imaging studies  CBC:  Recent Labs Lab 01-14-2017 1749  WBC 13.2*  NEUTROABS 12.0*  HGB 11.3*  HCT 36.9  MCV 77.7*  PLT 328   Basic Metabolic Panel:  Recent Labs Lab 01/14/2017 1749  NA 135  K 4.4  CL 96*  CO2 28  GLUCOSE 196*  BUN 14  CREATININE 1.13*  CALCIUM 8.8*   GFR: CrCl cannot be calculated (Unknown ideal weight.). Liver Function Tests:  Recent Labs Lab 2017/01/14 1749  AST 18  ALT 13*  ALKPHOS 67  BILITOT 0.5  PROT 6.4*  ALBUMIN 3.0*    Recent Labs Lab 14-Jan-2017 1749  LIPASE 23   No results for input(s): AMMONIA in the last 168 hours. Coagulation Profile: No results for input(s): INR, PROTIME in the last 168 hours. Cardiac Enzymes: No results for input(s): CKTOTAL, CKMB, CKMBINDEX, TROPONINI in the last 168 hours. BNP (last 3 results)  Recent Labs  03/01/16 1537  PROBNP 31.0   HbA1C: No results for  input(s): HGBA1C in the last 72 hours. CBG: No results for input(s): GLUCAP in the last 168 hours. Lipid Profile: No results for input(s): CHOL, HDL, LDLCALC, TRIG, CHOLHDL, LDLDIRECT in the last 72 hours. Thyroid Function Tests: No results for input(s): TSH, T4TOTAL, FREET4, T3FREE, THYROIDAB in the last 72 hours. Anemia Panel: No results for input(s): VITAMINB12, FOLATE, FERRITIN, TIBC, IRON, RETICCTPCT in the last 72 hours. Urine analysis:    Component Value Date/Time   COLORURINE YELLOW 02/05/2016 2233   APPEARANCEUR HAZY (A) 02/05/2016 2233   LABSPEC 1.014 02/05/2016 2233   PHURINE 7.0 02/05/2016 2233   GLUCOSEU NEGATIVE 02/05/2016 2233   HGBUR NEGATIVE 02/05/2016 2233   BILIRUBINUR NEGATIVE 02/05/2016 2233   KETONESUR NEGATIVE 02/05/2016 2233   PROTEINUR NEGATIVE 02/05/2016 2233   UROBILINOGEN 0.2 10/04/2014 0100   NITRITE NEGATIVE 02/05/2016 2233   LEUKOCYTESUR LARGE (A) 02/05/2016 2233   Sepsis Labs: @LABRCNTIP (procalcitonin:4,lacticidven:4) )No results found for this or any previous visit (from the past 240 hour(s)).   Radiological Exams on Admission: Dg Chest Port 1 View  Result Date: Jan 14, 2017 CLINICAL DATA:  Shortness of breath. EXAM: PORTABLE CHEST 1 VIEW COMPARISON:  Chest CT 12/05/2016.  Chest x-ray 12/05/2016. FINDINGS: 1956 hours. Stable asymmetric elevation right hemidiaphragm. Diffuse interstitial lung disease again noted, similar to prior. The cardiopericardial silhouette is within normal limits for size. The visualized bony structures of the thorax are intact. Telemetry leads overlie the chest. IMPRESSION: Stable exam. 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 Electronically Signed   By: Kennith Center M.D.   On: 01-14-2017 20:27     EKG: Independently reviewed.  Sinus tachycardia, QTC 427, LAD, LAD, poor R-wave progression.  Assessment/Plan Principal Problem:   Acute on chronic  respiratory failure with hypoxia (HCC) Active Problems:   Type 2 diabetes mellitus with complication, without long-term current use of insulin (HCC)   HTN (hypertension)   Hypothyroidism   Sepsis (HCC)   Chronic kidney disease (CKD), stage II (mild)   Nausea vomiting and diarrhea   Acute on chronic respiratory failure with hypoxia due to pulmonary fibrosis flare up: Chest x-ray showed stable chronic interstitial lung disease, but no infiltration. Patient d-dimer is mildly +0.62, but patient does not have chest pain, no signs of DVT, I have a low suspicions of PE.  -will admit patient to SDU - start BiPAP - check stat ABG --  Nebulizers: scheduled Atrovent and prn Xopenex Nebs -Solu-Medrol 60 mg IV tid - start Z pak  - Mucinex for cough  --Incentive spirometry -Urine S. pneumococcal antigen -Follow up blood culture x2, sputum culture, respiratory virus panel, Flu pcr  Sepsis vs. SIRS: pt meets criteria for sepsis with leukocytosis, tachycardia and tachypnea. Pending lactic acid. Currently hemodynamically stable. -will get Procalcitonin and trend lactic acid levels per sepsis protocol. -IVF: 2L of NS bolus in ED, followed by 100 cc/h  -on Z pak -f/u Bx  Type 2 diabetes mellitus with renal complication: Last A1c 6.0, well controled. Patient is taking metformin at home -SSI  HTN: -continue home meds: Hyzarr. -IV hydralazine when necessary  Hypothyroidism: Last TSH was 0.779 n 02/06/16 -Continue home Synthroid  Chronic kidney disease (CKD), stage II (mild): stable. Baseline creatinine 1.0-1.1. Her creatinine is 1.13, BUN 14 on admission. -f/u renal fx by BMP  Nausea vomiting and diarrhea: The patient's symptoms are mild. Etiology is not clear, may be due to viral infection. -IV fluid as above -When necessary Zofran for nausea   DVT ppx: SQ Lovenox Code Status: DNR (I discussed with patient in the presence of her 2 daughters who are the POA, and explained the meaning of CODE  STATUS. Patient wants to be DNR) Family Communication: Yes, patient's 2 daughter at bed side Disposition Plan:  Anticipate discharge back to previous home environment Consults called:  none Admission status: SDU/inpation       Date of Service 01/10/2017    Lorretta Harp [facility] Pager [phone]  If 7PM-7AM, please contact night-coverage [email or web address] Password [facility] 01/10/2017, 9:34 PM

## 2016-12-26 NOTE — ED Provider Notes (Signed)
[name] Provider Note   CSN: [id number] Arrival date & time: 01/07/2017  1724     History   Chief Complaint Chief Complaint  Patient presents with  . Shortness of Breath    HPI Kim [name] is a 76 y.o. female.  76 yo F with a significant past medical history of interstitial lung disease on 2 L of oxygen when exerting herself.  The patient has had worsening of her symptoms over the past 2 days.  She is currently on 20 mg of prednisone a day.  Had an appointment with her pulmonologist today but did not go because she felt bad.  She has had some nausea vomiting and diarrhea she thinks this may have made her symptoms worse.   The history is provided by the patient.  Illness  This is a new problem. The current episode started 2 days ago. The problem occurs constantly. The problem has been gradually worsening. Associated symptoms include chest pain (mild a couple days ago) and shortness of breath. Pertinent negatives include no headaches. Nothing aggravates the symptoms. Nothing relieves the symptoms. She has tried nothing for the symptoms. The treatment provided no relief.    Past Medical History:  Diagnosis Date  . Acute bronchiolitis due to respiratory syncytial virus (RSV) 02/08/2016  . ANA positive 02/09/2016  . Constipation due to pain medication   . Diabetes mellitus without complication (HCC)   . GERD (gastroesophageal reflux disease)   . Hyperlipemia   . Hypertension   . 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 Postinflammatory pulmonary fibrosis (HCC)   . Seasonal allergies     Patient Active Problem List   Diagnosis Date Noted  . Acute on chronic respiratory failure with hypoxia (HCC) May 23, 2016  . Goals of care, counseling/discussion   . SOB (shortness of breath)   . Palliative care by specialist   . ILD (interstitial lung disease) (HCC)   . Respiratory failure (HCC) 12/04/2016  . ANA positive 02/09/2016  . Acute  bronchiolitis due to respiratory syncytial virus (RSV) 02/08/2016  . Chronic respiratory failure with hypoxia (HCC) 02/06/2016  . Sepsis (HCC) 02/06/2016  . HCAP (healthcare-associated pneumonia) 02/06/2016  . 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 Acute respiratory failure with hypoxia (HCC) 01/25/2016  . Hypothyroidism 01/25/2016  . 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 Pure hypercholesterolemia 07/29/2012  . Type 2 diabetes mellitus with complication, without long-term current use of insulin (HCC) 01/16/2010  . HTN (hypertension) 01/16/2010  . Upper airway cough syndrome 01/12/2010    Past Surgical History:  Procedure Laterality Date  . ABDOMINAL HYSTERECTOMY    . COLONOSCOPY    . HERNIA REPAIR     X 2  . SMALL INTESTINE SURGERY    . TOTAL KNEE ARTHROPLASTY Right 07/06/2014   Procedure: RIGHT TOTAL KNEE ARTHROPLASTY;  Surgeon: Mckinley Jewelaniel Murphy, MD;  [facility];  Service: Orthopedics;  Laterality: Right;    OB History    No data available       Home Medications    Prior to Admission medications   Medication Sig Start Date End Date Taking? Authorizing Provider  acetaminophen (TYLENOL) 500 MG tablet Take 500 mg by mouth every 8 (eight) hours as needed (for pain or headaches). Use per bottle as needed.   Yes [provider]  aspirin EC 81 MG tablet Take 81 mg by mouth daily.   Yes [provider]  benzonatate (TESSALON) 200 MG  capsule Take 200 mg by mouth every 8 (eight) hours as needed for cough.   Yes [provider]  famotidine (PEPCID) 20 MG tablet TAKE 1 TABLET (20 MG TOTAL) BY MOUTH AT BEDTIME. 07/04/16  Yes Nyoka Cowden, MD  guaiFENesin (MUCINEX) 600 MG 12 hr tablet Take 2 tablets (1,200 mg total) by mouth 2 (two) times daily. 12/09/16  Yes Maxie Barb, MD  levothyroxine (SYNTHROID, LEVOTHROID) 75 MCG tablet Take 75 mcg by mouth daily before breakfast.    Yes [provider]  losartan-hydrochlorothiazide (HYZAAR) 100-12.5 MG per tablet Take 1 tablet by mouth daily.   Yes [provider]  metFORMIN (GLUCOPHAGE) 500 MG tablet TAKE 1 TABLET BY MOUTH TWICE A DAY Patient taking differently: Take 500 mg by mouth two times a day 03/31/13  Yes Worthy Rancher B, FNP  OXYGEN Inhale 2-6 L into the lungs See admin instructions. 2 liters at rest and 6 liters with activity   Yes [provider]  predniSONE (DELTASONE) 10 MG tablet Take 4 tablets (40 mg total) by mouth daily. Take 40 mg for 3 days, 30 mg for 3 days and then 20 mg daily. 12/09/16  Yes Maxie Barb, MD  pantoprazole (PROTONIX) 40 MG tablet Take 1 tablet (40 mg total) by mouth daily. Take 30-60 min before first meal of the day Patient not taking: Reported on 12/04/2016 08/22/16   Nyoka Cowden, MD  predniSONE (DELTASONE) 10 MG tablet TAKE 1 & 1/2 TABLET EVERY DAY WITH BREAKFAST 12/16/16   Nyoka Cowden, MD    Family History Family History  Problem Relation Age of Onset  . Stomach cancer Sister   . Heart attack Sister     Social History Social History  Substance Use Topics  . Smoking status: Former Smoker    Packs/day: 0.50    Years: 10.00    Types: Cigarettes    Quit date: 02/26/1988  . Smokeless tobacco: Never Used  . Alcohol use No     Allergies   Codeine and Penicillins   Review of Systems Review of Systems  Constitutional: Negative for chills and fever.  HENT: Negative for congestion and rhinorrhea.   Eyes: Negative for redness and visual disturbance.  Respiratory: Positive for shortness of breath. Negative for wheezing.   Cardiovascular: Positive for chest pain (mild a couple days ago). Negative for palpitations.  Gastrointestinal: Negative for nausea and vomiting.  Genitourinary: Negative for dysuria and urgency.  Musculoskeletal: Negative for arthralgias and myalgias.  Skin: Negative for pallor and wound.  Neurological: Negative for  dizziness and headaches.     Physical Exam Updated Vital Signs BP 138/65   Pulse (!) 114   Temp 98.1 F (36.7 C) (Oral)   Resp (!) 42   SpO2 97%   Physical Exam  Constitutional: She is oriented to person, place, and time. She appears well-developed and well-nourished. No distress.  HENT:  Head: Normocephalic and atraumatic.  Eyes: Pupils are equal, round, and reactive to light. EOM are normal.  Neck: Normal range of motion. Neck supple.  Cardiovascular: Normal rate and regular rhythm.  Exam reveals no gallop and no friction rub.   No murmur heard. Pulmonary/Chest: Effort normal. She has no wheezes. She has no rales.  Tachypnea, diminished breath sounds in all fields.  Abdominal: Soft. She exhibits no distension and no mass. There is no tenderness. There is no guarding.  Musculoskeletal: She exhibits no edema or tenderness.  Neurological: She is alert and oriented to person,  place, and time.  Skin: Skin is warm and dry. She is not diaphoretic.  Psychiatric: She has a normal mood and affect. Her behavior is normal.  Nursing note and vitals reviewed.    ED Treatments / Results  Labs (all labs ordered are listed, but only abnormal results are displayed) Labs Reviewed  CBC WITH DIFFERENTIAL/PLATELET - Abnormal; Notable for the following:       Result Value   WBC 13.2 (*)    Hemoglobin 11.3 (*)    MCV 77.7 (*)    MCH 23.8 (*)    RDW 16.6 (*)    Neutro Abs 12.0 (*)    All other components within normal limits  COMPREHENSIVE METABOLIC PANEL - Abnormal; Notable for the following:    Chloride 96 (*)    Glucose, Bld 196 (*)    Creatinine, Ser 1.13 (*)    Calcium 8.8 (*)    Total Protein 6.4 (*)    Albumin 3.0 (*)    ALT 13 (*)    GFR calc non Af Amer 46 (*)    GFR calc Af Amer 54 (*)    All other components within normal limits  D-DIMER, QUANTITATIVE (NOT AT [facility]) - Abnormal; Notable for the following:    D-Dimer, Quant 0.62 (*)    All other components within normal  limits  LIPASE, BLOOD  BLOOD GAS, ARTERIAL  I-STAT TROPONIN, ED    EKG  EKG Interpretation  Date/Time:  Thursday 12-30-16 17:25:43 EDT Ventricular Rate:  130 PR Interval:    QRS Duration: 78 QT Interval:  290 QTC Calculation: 427 R Axis:   -17 Text Interpretation:  Sinus tachycardia Probable left atrial enlargement Probable left ventricular hypertrophy Inferior infarct, old No significant change since last tracing Confirmed by Melene Plan [phone]) on December 30, 2016 5:59:36 PM       Radiology Dg Chest Port 1 View  Result Date: 12/30/16 CLINICAL DATA:  Shortness of breath. EXAM: PORTABLE CHEST 1 VIEW COMPARISON:  Chest CT 12/05/2016.  Chest x-ray 12/05/2016. FINDINGS: 1956 hours. Stable asymmetric elevation right hemidiaphragm. Diffuse interstitial lung disease again noted, similar to prior. The cardiopericardial silhouette is within normal limits for size. The visualized bony structures of the thorax are intact. Telemetry leads overlie the chest. IMPRESSION: Stable exam. 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 Electronically Signed   By: Kennith Center M.D.   On: December 30, 2016 20:27    Procedures Procedures (including critical care time)  Medications Ordered in ED Medications  sodium chloride 0.9 % bolus 1,000 mL (0 mLs Intravenous Stopped 12/30/2016 1942)  ipratropium-albuterol (DUONEB) 0.5-2.5 (3) MG/3ML nebulizer solution 3 mL (3 mLs Nebulization Given 2016-12-30 1752)  methylPREDNISolone sodium succinate (SOLU-MEDROL) 125 mg/2 mL injection 125 mg (125 mg Intravenous Given 12/30/2016 1754)     Initial Impression / Assessment and Plan / ED Course  I have reviewed the triage vital signs and the nursing notes.  Pertinent labs & imaging results that were available during my care of the patient were reviewed by me and considered in my medical decision making (see chart for details).     76 yo F with a chief  complaint of shortness of breath.  This is been worsening over the past couple days.  Feels like her prior interstitial lung disease exacerbation.  She is currently on 20 mg of prednisone a day.  Symptoms significantly improved with Solu-Medrol and duo nebs.  Placed back on the nasal cannula and the  patient was asking for a meal.  D-dimer is age-adjusted negative.  Patient was able to eat but she still feeling significantly short of breath.  Her respiratory rate is still in the upper 30s.  She feels that she is unable to go home in this state.  She has been transitioned to nasal cannula and is tolerating it well but is on 6 L where she is normally on 2 at rest at home.  I will discussed with the hospitalist.  CRITICAL CARE Performed by: Rae Roam   Total critical care time: 35 minutes  Critical care time was exclusive of separately billable procedures and treating other patients.  Critical care was necessary to treat or prevent imminent or life-threatening deterioration.  Critical care was time spent personally by me on the following activities: development of treatment plan with patient and/or surrogate as well as nursing, discussions with consultants, evaluation of patient's response to treatment, examination of patient, obtaining history from patient or surrogate, ordering and performing treatments and interventions, ordering and review of laboratory studies, ordering and review of radiographic studies, pulse oximetry and re-evaluation of patient's condition.  The patients results and plan were reviewed and discussed.   Any x-rays performed were independently reviewed by myself.   Differential diagnosis were considered with the presenting HPI.  Medications  sodium chloride 0.9 % bolus 1,000 mL (0 mLs Intravenous Stopped 01/05/2017 1942)  ipratropium-albuterol (DUONEB) 0.5-2.5 (3) MG/3ML nebulizer solution 3 mL (3 mLs Nebulization Given 01/17/2017 1752)  methylPREDNISolone sodium  succinate (SOLU-MEDROL) 125 mg/2 mL injection 125 mg (125 mg Intravenous Given 01/05/2017 1754)    Vitals:   01/14/2017 1746 01/22/2017 1815 01/04/2017 2015 01/20/2017 2100  BP: 137/72 123/81 (!) 144/67 138/65  Pulse: (!) 120 (!) 123 (!) 124 (!) 114  Resp: (!) 43 (!) 39 (!) 44 (!) 42  Temp:      TempSrc:      SpO2: 100% 100% 94% 97%    Final diagnoses:  ILD (interstitial lung disease) (HCC)    Admission/ observation were discussed with the admitting physician, patient and/or family and they are comfortable with the plan.   Final Clinical Impressions(s) / ED Diagnoses   Final diagnoses:  ILD (interstitial lung disease) (HCC)    New Prescriptions New Prescriptions   No medications on file     Melene Plan, DO 01/21/2017 2118

## 2016-12-26 NOTE — ED Triage Notes (Signed)
Pt arrived via [facility] from home c/o SOB with HX pulmonary Fibrosis.  Pt wears 2L O2 at home arrived to ED 12L non-rebreather.

## 2016-12-27 ENCOUNTER — Encounter (HOSPITAL_COMMUNITY): Payer: Self-pay | Admitting: *Deleted

## 2016-12-27 DIAGNOSIS — Z7189 Other specified counseling: Secondary | ICD-10-CM

## 2016-12-27 DIAGNOSIS — N182 Chronic kidney disease, stage 2 (mild): Secondary | ICD-10-CM

## 2016-12-27 DIAGNOSIS — R0602 Shortness of breath: Secondary | ICD-10-CM

## 2016-12-27 DIAGNOSIS — R197 Diarrhea, unspecified: Secondary | ICD-10-CM

## 2016-12-27 DIAGNOSIS — R6510 Systemic inflammatory response syndrome (SIRS) of non-infectious origin without acute organ dysfunction: Secondary | ICD-10-CM

## 2016-12-27 DIAGNOSIS — R112 Nausea with vomiting, unspecified: Secondary | ICD-10-CM

## 2016-12-27 DIAGNOSIS — Z515 Encounter for palliative care: Secondary | ICD-10-CM

## 2016-12-27 DIAGNOSIS — J9621 Acute and chronic respiratory failure with hypoxia: Secondary | ICD-10-CM

## 2016-12-27 LAB — RESPIRATORY PANEL BY PCR
Adenovirus: NOT DETECTED
BORDETELLA PERTUSSIS-RVPCR: NOT DETECTED
Chlamydophila pneumoniae: NOT DETECTED
Coronavirus 229E: NOT DETECTED
Coronavirus HKU1: NOT DETECTED
Coronavirus NL63: NOT DETECTED
Coronavirus OC43: NOT DETECTED
INFLUENZA A-RVPPCR: NOT DETECTED
INFLUENZA B-RVPPCR: NOT DETECTED
METAPNEUMOVIRUS-RVPPCR: NOT DETECTED
Mycoplasma pneumoniae: NOT DETECTED
PARAINFLUENZA VIRUS 2-RVPPCR: NOT DETECTED
PARAINFLUENZA VIRUS 3-RVPPCR: NOT DETECTED
Parainfluenza Virus 1: NOT DETECTED
Parainfluenza Virus 4: NOT DETECTED
RESPIRATORY SYNCYTIAL VIRUS-RVPPCR: NOT DETECTED
RHINOVIRUS / ENTEROVIRUS - RVPPCR: NOT DETECTED

## 2016-12-27 LAB — LACTIC ACID, PLASMA: LACTIC ACID, VENOUS: 1.5 mmol/L (ref 0.5–1.9)

## 2016-12-27 LAB — GLUCOSE, CAPILLARY
GLUCOSE-CAPILLARY: 161 mg/dL — AB (ref 65–99)
GLUCOSE-CAPILLARY: 268 mg/dL — AB (ref 65–99)
Glucose-Capillary: 156 mg/dL — ABNORMAL HIGH (ref 65–99)
Glucose-Capillary: 279 mg/dL — ABNORMAL HIGH (ref 65–99)

## 2016-12-27 LAB — INFLUENZA PANEL BY PCR (TYPE A & B)
Influenza A By PCR: NEGATIVE
Influenza B By PCR: NEGATIVE

## 2016-12-27 LAB — PROCALCITONIN: Procalcitonin: <0.1 ng/mL

## 2016-12-27 LAB — BRAIN NATRIURETIC PEPTIDE: B NATRIURETIC PEPTIDE 5: 45.3 pg/mL (ref 0.0–100.0)

## 2016-12-27 LAB — MRSA PCR SCREENING: MRSA BY PCR: NEGATIVE

## 2016-12-27 MED ORDER — HYDROCHLOROTHIAZIDE 12.5 MG PO CAPS
12.5000 mg | ORAL_CAPSULE | Freq: Every day | ORAL | Status: DC
  Administered 2016-12-27 – 2016-12-28 (×2): 12.5 mg via ORAL
  Filled 2016-12-27 (×2): qty 1

## 2016-12-27 MED ORDER — GUAIFENESIN-DM 100-10 MG/5ML PO SYRP
5.0000 mL | ORAL_SOLUTION | ORAL | Status: DC | PRN
  Administered 2016-12-27 – 2016-12-29 (×5): 5 mL via ORAL
  Filled 2016-12-27 (×5): qty 5

## 2016-12-27 MED ORDER — SODIUM CHLORIDE 0.9 % IV BOLUS (SEPSIS)
500.0000 mL | Freq: Once | INTRAVENOUS | Status: AC
  Administered 2016-12-27: 500 mL via INTRAVENOUS

## 2016-12-27 MED ORDER — LEVALBUTEROL HCL 1.25 MG/0.5ML IN NEBU: 1.2500 mg | INHALATION_SOLUTION | Freq: Four times a day (QID) | RESPIRATORY_TRACT | Status: DC

## 2016-12-27 MED ORDER — SODIUM CHLORIDE 0.9 % IV SOLN
INTRAVENOUS | Status: AC
  Administered 2016-12-27: 21:00:00 via INTRAVENOUS

## 2016-12-27 MED ORDER — LOSARTAN POTASSIUM 50 MG PO TABS
100.0000 mg | ORAL_TABLET | Freq: Every day | ORAL | Status: DC
  Administered 2016-12-27 – 2016-12-28 (×2): 100 mg via ORAL
  Filled 2016-12-27 (×2): qty 2

## 2016-12-27 MED ORDER — BENZONATATE 100 MG PO CAPS
200.0000 mg | ORAL_CAPSULE | Freq: Three times a day (TID) | ORAL | Status: DC | PRN
  Administered 2016-12-27 – 2016-12-29 (×5): 200 mg via ORAL
  Filled 2016-12-27 (×5): qty 2

## 2016-12-27 MED ORDER — IPRATROPIUM BROMIDE 0.02 % IN SOLN: 0.5000 mg | Freq: Four times a day (QID) | RESPIRATORY_TRACT | Status: DC

## 2016-12-27 MED ORDER — IPRATROPIUM-ALBUTEROL 0.5-2.5 (3) MG/3ML IN SOLN
3.0000 mL | Freq: Four times a day (QID) | RESPIRATORY_TRACT | Status: DC
  Administered 2016-12-27 – 2017-01-02 (×22): 3 mL via RESPIRATORY_TRACT
  Filled 2016-12-27 (×22): qty 3

## 2016-12-27 MED ORDER — LEVALBUTEROL HCL 0.63 MG/3ML IN NEBU: 0.6300 mg | INHALATION_SOLUTION | Freq: Four times a day (QID) | RESPIRATORY_TRACT | Status: DC | PRN

## 2016-12-27 NOTE — Care Management Note (Addendum)
Case Management Note  Patient Details  Name: Kim [name] MRN: [id number] Date of Birth: November 29, 1940  Subjective/Objective:     From home with family, presents with acute on chronic resp failure with hypoxia, sepsis, htn, hypothroidism, ckd, n/v/d, DM2, dependent on oxygen at home.  [facility]      11/7 Kim Capeeborah Khiara Shuping RN, BSN- Acute /chronic hypoxic resp failure due to pulmonary fibrosis flare, ckd stage 2, cont s to require bipap and NRB.  Iv abx , solumedrol, pallitive following.  11/8 Kim Capeeborah Ibrahim Mcpheeters RN, BSN - conts on bipap, palliative following, plan [place],  transition to comfort  .  11/9  1404 Kim Capeeborah Sihaam Chrobak RN, BSN - patient taken off NRB,put on 2 liters for comfort with family at bedside.  Expired.   Action/Plan: NCM will follow for dc needs.   Expected Discharge Date:                  Expected Discharge Plan:  Home/Self Care  In-House Referral:     Discharge planning Services  CM Consult  Post Acute Care Choice:    Choice offered to:     DME Arranged:    DME Agency:     HH Arranged:    HH Agency:     Status of Service:  In process, will continue to follow  If discussed at Long Length of Stay Meetings, dates discussed:    Additional Comments:  Kim [name], Kim Mcgreal Clinton, RN 12/27/2016, 4:54 PM

## 2016-12-27 NOTE — Progress Notes (Signed)
Pt desat on HFNC, and has increased WOB. RT placed pt back on Bipap.  RN in pt's room.

## 2016-12-27 NOTE — ED Notes (Signed)
Dr. Clyde LundborgNiu notified of lactic acid result, new orders placed.

## 2016-12-27 NOTE — ED Notes (Signed)
Breakfast serve to pt. , will transport to stepdown after eating breakfast.

## 2016-12-27 NOTE — Progress Notes (Signed)
PROGRESS NOTE   Kim [name]  ZOX:[id number]    DOB: Aug 05, 1940    DOA: 01/08/2017  PCP: Renaye RakersBland, Veita, MD   I [facility] briefly [facility]  Brief Narrative:  76 year old female, lives with family, independent of activities, PMH of severe ILD, chronic hypoxic respiratory failure >oxygen dependent on 2 L oxygen at rest and 6 L with activity & steroid dependent (Dr. Sherene SiresWert, pulmonology), HTN, HLD, GERD, DM, hypothyroid, stage II chronic kidney disease reports progressively worsening dyspnea over the past couple of days, chronic cough with intermittent clear sputum which is not changed, chills without fevers and no chest pains. Denies sick contacts at home. Self-limiting nausea, vomiting and diarrhea 2 days ago. In the ED hypoxic in the 60s on 2 L. Has been on BiPAP since ED arrival 11/1. Chest x-ray shows pulmonary fibrosis, BNP normal, RSV panel negative. Admitted to stepdown for acute on chronic hypoxic respiratory failure due to pulmonary fibrosis flare. Pulmonology consulted.   Assessment & Plan:   Principal Problem:   Acute on chronic respiratory failure with hypoxia (HCC) Active Problems:   HTN (hypertension)   Hypothyroidism   Sepsis (HCC)   Chronic kidney disease (CKD), stage II (mild)   Nausea vomiting and diarrhea   Type II diabetes mellitus with renal manifestations (HCC)   Acute on chronic hypoxic respiratory failure secondary to severe/end-stage pulmonary fibrosis flare - At baseline patient is oxygen dependent (2L at rest and 6L with activity) and steroid dependent (15 mg prednisone daily). - Low index of suspicion for infectious etiology. No fever, only mild leukocytosis, RSV panel negative, chest x-ray shows chronic interstitial lung disease without pneumonia. Blood cultures pending. - Has been on BiPAP since arrival to ED and brief attempts to wean off BiPAP unsuccessful and she easily desaturates. - Pulmonology consultation  appreciated. - Continue IV Solu-Medrol 125 MG 3 times a day, scheduled DuoNeb's, Xopenex when necessary, antitussives, empiric azithromycin - Continue BiPAP and try to wean off BiPAP as tolerated. - Agree with pulmonology that if she does not improve in the next day or 2, palliative care may be appropriate. We will consult palliative care team had seen her during prior admission. - D-dimer 0.62. Low index of suspicion for PE.  SIRS - Likely related to acute respiratory failure rather than true sepsis. - Influenza panel PCR and RSV panel PCR negative. Lactate normalized. PCT Neg - Continue azithromycin. Reduce IV fluids.  DM 2 - Last A1c: 6. Hold metformin. SSI. Monitor closely while on steroids.  Essential hypertension Controlled. Currently on Hyzaar. When necessary IV hydralazine.  Hypothyroid TSH 0.779 on 02/06/16. Continue Synthroid.  Stage II chronic kidney disease: Baseline creatinine 1-1.1. Presented with creatinine of 1.13. Stable.  Self-limiting nausea, vomiting and diarrhea Seems to have resolved. Monitor.  Microcytic anemia Stable.  . DVT prophylaxis: Lovenox  Code Status: DO NOT RESUSCITATE  Family Communication: None at bedside  Disposition: To be determined. Likely DC home with palliative or hospice.   Consultants:  Pulmonology Palliative care team   Procedures:  BiPAP   Antimicrobials:  Azithromycin    Subjective: Seen this morning in the ED. As per RN report, has been on BiPAP since she came to the ED the day prior and unable to wean off. Brief attempts to take off BiPAP and have breakfast this morning caused marked oxygen desaturation. Ongoing dyspnea. Reports chronic cough with intermittent white sputum that has not really changed. No chest pain reported. No further nausea,  vomiting or diarrhea.   ROS: As above.  Objective:  Vitals:   12/27/16 1128 12/27/16 1139 12/27/16 1157 12/27/16 1230  BP:    (!) 148/78  Pulse: 85 89  82  Resp: (!) 36  (!) 36  (!) 29  Temp:    (!) 97.5 F (36.4 C)  TempSrc:    Oral  SpO2:  99% 100% 100%  Weight:        Examination:  General exam: Elderly female, moderately built and nourished, chronically ill looking, lying propped up in bed on BiPAP with no obvious distress. Respiratory system: markedly reduced breath sounds bilaterally with scattered extensive bilateral coarse/Velcro-like chronic crackles. No wheezing or rhonchi appreciated. No increased work of breathing.  Cardiovascular system: S1 & S2 heard, RRR. No JVD, murmurs, rubs, gallops or clicks. No pedal edema. Telemetry: Sinus rhythm.  Gastrointestinal system: Abdomen is nondistended, soft and nontender. No organomegaly or masses felt. Normal bowel sounds heard. Central nervous system: Alert and oriented. No focal neurological deficits. Extremities: Symmetric 5 x 5 power. Skin: No rashes, lesions or ulcers Psychiatry: Judgement and insight appear normal. Mood & affect appropriate.     Data Reviewed: I have personally reviewed following labs and imaging studies  CBC:  Recent Labs Lab 01/04/2017 1749  WBC 13.2*  NEUTROABS 12.0*  HGB 11.3*  HCT 36.9  MCV 77.7*  PLT 328   Basic Metabolic Panel:  Recent Labs Lab 01/09/2017 1749  NA 135  K 4.4  CL 96*  CO2 28  GLUCOSE 196*  BUN 14  CREATININE 1.13*  CALCIUM 8.8*   Liver Function Tests:  Recent Labs Lab 01/08/2017 1749  AST 18  ALT 13*  ALKPHOS 67  BILITOT 0.5  PROT 6.4*  ALBUMIN 3.0*   CBG: No results for input(s): GLUCAP in the last 168 hours.  Recent Results (from the past 240 hour(s))  Culture, blood (routine x 2) Call MD if unable to obtain prior to antibiotics being given     Status: None (Preliminary result)   Collection Time: 01/10/2017 11:07 PM  Result Value Ref Range Status   Specimen Description BLOOD RIGHT HAND  Final   Special Requests   Final    BOTTLES DRAWN AEROBIC AND ANAEROBIC Blood Culture adequate volume   Culture PENDING  Incomplete    Report Status PENDING  Incomplete  Respiratory Panel by PCR     Status: None   Collection Time: 01/11/2017 11:10 PM  Result Value Ref Range Status   Adenovirus NOT DETECTED NOT DETECTED Final   Coronavirus 229E NOT DETECTED NOT DETECTED Final   Coronavirus HKU1 NOT DETECTED NOT DETECTED Final   Coronavirus NL63 NOT DETECTED NOT DETECTED Final   Coronavirus OC43 NOT DETECTED NOT DETECTED Final   Metapneumovirus NOT DETECTED NOT DETECTED Final   Rhinovirus / Enterovirus NOT DETECTED NOT DETECTED Final   Influenza A NOT DETECTED NOT DETECTED Final   Influenza B NOT DETECTED NOT DETECTED Final   Parainfluenza Virus 1 NOT DETECTED NOT DETECTED Final   Parainfluenza Virus 2 NOT DETECTED NOT DETECTED Final   Parainfluenza Virus 3 NOT DETECTED NOT DETECTED Final   Parainfluenza Virus 4 NOT DETECTED NOT DETECTED Final   Respiratory Syncytial Virus NOT DETECTED NOT DETECTED Final   Bordetella pertussis NOT DETECTED NOT DETECTED Final   Chlamydophila pneumoniae NOT DETECTED NOT DETECTED Final   Mycoplasma pneumoniae NOT DETECTED NOT DETECTED Final  MRSA PCR Screening     Status: None   Collection Time: 12/27/16  8:23 AM  Result Value Ref Range Status   MRSA by PCR NEGATIVE NEGATIVE Final    Comment:        The GeneXpert MRSA Assay (FDA approved for NASAL specimens only), is one component of a comprehensive MRSA colonization surveillance program. It is not intended to diagnose MRSA infection nor to guide or monitor treatment for MRSA infections.          Radiology Studies: Dg Chest Port 1 View  Result Date: 2016/12/31 CLINICAL DATA:  Shortness of breath. EXAM: PORTABLE CHEST 1 VIEW COMPARISON:  Chest CT 12/05/2016.  Chest x-ray 12/05/2016. FINDINGS: 1956 hours. Stable asymmetric elevation right hemidiaphragm. Diffuse interstitial lung disease again noted, similar to prior. The cardiopericardial silhouette is within normal limits for size. The visualized bony structures of the thorax  are intact. Telemetry leads overlie the chest. IMPRESSION: Stable exam. 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 Electronically Signed   By: Kennith Center M.D.   On: 2016-12-31 20:27        Scheduled Meds: . aspirin EC  81 mg Oral Daily  . azithromycin  250 mg Oral Daily  . enoxaparin (LOVENOX) injection  40 mg Subcutaneous Q24H  . famotidine  20 mg Oral QHS  . guaiFENesin  1,200 mg Oral BID  . hydrochlorothiazide  12.5 mg Oral Daily  . insulin aspart  0-5 Units Subcutaneous QHS  . insulin aspart  0-9 Units Subcutaneous TID WC  . ipratropium  0.5 mg Nebulization Q6H  . levalbuterol  1.25 mg Nebulization Q6H  . levothyroxine  75 mcg Oral QAC breakfast  . losartan  100 mg Oral Daily  . methylPREDNISolone (SOLU-MEDROL) injection  125 mg Intravenous TID   Continuous Infusions: . sodium chloride 100 mL/hr at 12/27/16 0649     LOS: 1 day     Bailyn Spackman, MD, FACP, FHM. [facility] Pager [phone]  If 7PM-7AM, please contact night-coverage [email or web address] Password [facility] 12/27/2016, 12:39 PM

## 2016-12-27 NOTE — Progress Notes (Addendum)
RT placed pt on a [place] HFNC 8L to trial off Bipap.  Pt tolerating well at this time. RN in pt's room.

## 2016-12-27 NOTE — Consult Note (Signed)
Consultation Note Date: 12/27/2016   Patient Name: Kim [name]  DOB: [birth year]  MRN: [id number]  Age / Sex: 76 y.o., female  PCP: Renaye Rakers, MD Referring Physician: Elease Etienne, MD  Reason for Consultation: Establishing goals of care  HPI/Patient Profile: 76 y.o. female  with past medical history of pulmonary fibrosis and progressive dyspnea admitted on 2017/01/21 with acute on chronic resp failure, likely progression of her pulmonary fibrosis.   Patient is known to palliative service, she was admitted last month with fibrosis flare, at that time, she was seen by palliative providers, followed closely, she was able to be d/c home, there were recommendations for home based palliative services to be initiated, to simply follow her trajectory and to provide additional support.   She has since been re admitted with another fibrosis flare, she is currently in step down, she required continuous BIPAP, she only recently, a few hours prior to my visit, came off of the BIPAP.  A palliative consult has been requested for ongoing goals of care discussions and to continue to provide supportive care.   Clinical Assessment and Goals of Care: The patient is resting in bed, she recognizes meeting my co worker from her last hospitalization, she just came off of the BIPAP, she does get short of breath and is not able to speak for long, how ever, is able to tell me that her breathing has improved, and that she is feeling hungry.   She states that for the past 24-48 hours prior to her hospitalization this time, she became very weak, she was having diarrhea, shortness of breath. EMS was not able to get into her house, as she was so weak, she couldn't walk to open her door, she had to wait until her daughter got home from work, to come to her home and call EMS.  Patient says, "I know you'll are doing all that you can".   We reviewed her serious condition of pulmonary fibrosis, her recurrent hospitalizations. I offered her supportive care, presence and attentive listening.   See below, thank you for the consult.   NEXT OF KIN  has 2 daughters who live locally Patient lives by herself.   SUMMARY OF RECOMMENDATIONS    Agree with DNR DNI Continue current mode of care PMT to monitor disease trajectory and hospital course.  Appreciate PCCM input and recommendations.  Monitor BIPAP and O2 needs, currently on 8L high flow O2, monitor PO intake to determine next steps.  Might benefit from SNF rehab attempt this time.   Code Status/Advance Care Planning:  DNR    Symptom Management:    as above   Palliative Prophylaxis:   Bowel Regimen  Psycho-social/Spiritual:   Desire for further Chaplaincy support:no  Additional Recommendations: Caregiving  Support/Resources  Prognosis:   Unable to determine  Discharge Planning: To Be Determined      Primary Diagnoses: Present on Admission: . HTN (hypertension) . Hypothyroidism . Acute on chronic respiratory failure with hypoxia (HCC) . Sepsis (HCC) . Chronic kidney  disease (CKD), stage II (mild) . Nausea vomiting and diarrhea   I have reviewed the medical record, interviewed the patient and family, and examined the patient. The following aspects are pertinent.  Past Medical History:  Diagnosis Date  . Acute bronchiolitis due to respiratory syncytial virus (RSV) 02/08/2016  . ANA positive 02/09/2016  . Constipation due to pain medication   . Diabetes mellitus without complication (HCC)   . GERD (gastroesophageal reflux disease)   . Hyperlipemia   . Hypertension   . 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 Postinflammatory pulmonary fibrosis (HCC)   . Seasonal allergies    Social History   Social History  . Marital status: Married    Spouse name: N/A  . Number of children: N/A  . Years of education: N/A   Social History Main Topics  .  Smoking status: Former Smoker    Packs/day: 0.50    Years: 10.00    Types: Cigarettes    Quit date: 02/26/1988  . Smokeless tobacco: Never Used  . Alcohol use No  . Drug use: No  . Sexual activity: Not Asked   Other Topics Concern  . None   Social History Narrative  . None   Family History  Problem Relation Age of Onset  . Stomach cancer Sister   . Heart attack Sister    Scheduled Meds: . aspirin EC  81 mg Oral Daily  . azithromycin  250 mg Oral Daily  . enoxaparin (LOVENOX) injection  40 mg Subcutaneous Q24H  . famotidine  20 mg Oral QHS  . guaiFENesin  1,200 mg Oral BID  . hydrochlorothiazide  12.5 mg Oral Daily  . insulin aspart  0-5 Units Subcutaneous QHS  . insulin aspart  0-9 Units Subcutaneous TID WC  . ipratropium-albuterol  3 mL Nebulization QID  . levothyroxine  75 mcg Oral QAC breakfast  . losartan  100 mg Oral Daily  . methylPREDNISolone (SOLU-MEDROL) injection  125 mg Intravenous TID   Continuous Infusions: . sodium chloride 50 mL/hr (12/27/16 1318)   PRN Meds:.acetaminophen, benzonatate, hydrALAZINE, levalbuterol, ondansetron (ZOFRAN) IV, zolpidem Medications Prior to Admission:  Prior to Admission medications   Medication Sig Start Date End Date Taking? Authorizing Provider  acetaminophen (TYLENOL) 500 MG tablet Take 500 mg by mouth every 8 (eight) hours as needed (for pain or headaches). Use per bottle as needed.   Yes [provider]  aspirin EC 81 MG tablet Take 81 mg by mouth daily.   Yes [provider]  benzonatate (TESSALON) 200 MG capsule Take 200 mg by mouth every 8 (eight) hours as needed for cough.   Yes [provider]  famotidine (PEPCID) 20 MG tablet TAKE 1 TABLET (20 MG TOTAL) BY MOUTH AT BEDTIME. 07/04/16  Yes Nyoka Cowden, MD  guaiFENesin (MUCINEX) 600 MG 12 hr tablet Take 2 tablets (1,200 mg total) by mouth 2 (two) times daily. 12/09/16  Yes Maxie Barb, MD  levothyroxine (SYNTHROID, LEVOTHROID) 75  MCG tablet Take 75 mcg by mouth daily before breakfast.   Yes [provider]  losartan-hydrochlorothiazide (HYZAAR) 100-12.5 MG per tablet Take 1 tablet by mouth daily.   Yes [provider]  metFORMIN (GLUCOPHAGE) 500 MG tablet TAKE 1 TABLET BY MOUTH TWICE A DAY Patient taking differently: Take 500 mg by mouth two times a day 03/31/13  Yes Worthy Rancher B, FNP  OXYGEN Inhale 2-6 L into the lungs See admin instructions. 2 liters at rest and 6 liters with activity   Yes  [provider]  predniSONE (DELTASONE) 10 MG tablet Take 4 tablets (40 mg total) by mouth daily. Take 40 mg for 3 days, 30 mg for 3 days and then 20 mg daily. 12/09/16  Yes Maxie BarbBhandari, Dron Prasad, MD  pantoprazole (PROTONIX) 40 MG tablet Take 1 tablet (40 mg total) by mouth daily. Take 30-60 min before first meal of the day Patient not taking: Reported on 12/04/2016 08/22/16   Nyoka CowdenWert, Michael B, MD  predniSONE (DELTASONE) 10 MG tablet TAKE 1 & 1/2 TABLET EVERY DAY WITH BREAKFAST 12/16/16   Nyoka CowdenWert, Michael B, MD   Allergies  Allergen Reactions  . Codeine Nausea And Vomiting and Other (See Comments)    "Takes my appetite" also  . Penicillins Rash    Has patient had a PCN reaction causing immediate rash, facial/tongue/throat swelling, SOB or lightheadedness with hypotension: Yes Has patient had a PCN reaction causing severe rash involving mucus membranes or skin necrosis: No Has patient had a PCN reaction that required hospitalization No Has patient had a PCN reaction occurring within the last 10 years: No If all of the above answers are "NO", then may proceed with Cephalosporin use.    Review of Systems Dyspnea is better, now off BIPAP  Physical Exam Awake alert Now off BIPAP Crackles + Abdomen soft S1 S2 regular No edema  Vital Signs: BP (!) 148/78 (BP Location: Right Arm)   Pulse 82   Temp (!) 97.5 F (36.4 C) (Oral)   Resp (!) 29   Wt 83 kg (182 lb 15.7 oz)   SpO2 100%   BMI 27.02 kg/m    Pain Assessment: No/denies pain   Pain Score: 0-No pain  PPS 40% SpO2: SpO2: 100 % O2 Device:SpO2: 100 % O2 Flow Rate: .O2 Flow Rate (L/min): 8 L/min  IO: Intake/output summary:  Intake/Output Summary (Last 24 hours) at 12/27/16 1642 Last data filed at 12/27/16 1602  Gross per 24 hour  Intake             2000 ml  Output              500 ml  Net             1500 ml    LBM:   Baseline Weight: Weight: 83 kg (182 lb 15.7 oz) Most recent weight: Weight: 83 kg (182 lb 15.7 oz)     Palliative Assessment/Data:   Flowsheet Rows     Most Recent Value  Intake Tab  Referral Department  Hospitalist  Unit at Time of Referral  Intermediate Care Unit  Palliative Care Primary Diagnosis  Pulmonary  Palliative Care Type  Return patient Palliative Care  Reason for referral  Non-pain Symptom, Clarify Goals of Care  Clinical Assessment  Palliative Performance Scale Score  30%  Pain Max last 24 hours  3  Pain Min Last 24 hours  2  Dyspnea Max Last 24 Hours  6  Dyspnea Min Last 24 hours  5  Nausea Max Last 24 Hours  1  Nausea Min Last 24 Hours  0  Anxiety Max Last 24 Hours  4  Anxiety Min Last 24 Hours  3  Psychosocial & Spiritual Assessment  Palliative Care Outcomes  Patient/Family meeting held?  Yes  Who was at the meeting?  patient, who is decisional, she has 2 daughters who are in the area,not present for initial family meeting during this consult.   Palliative Care Outcomes  Clarified goals of care  Time In:  1540 Time Out:  1640 Time Total:  60 min  Greater than 50%  of this time was spent counseling and coordinating care related to the above assessment and plan.  Signed by: Rosalin Hawking, MD  [phone]  Please contact Palliative Medicine Team phone at [phone] for questions and concerns.  For individual provider: See Loretha Stapler

## 2016-12-27 NOTE — Progress Notes (Signed)
RT assisted in pt transport on Bipap from ED to 3M12. Pt continues to be tachypneic.  Report given to Unit RT.

## 2016-12-27 NOTE — Progress Notes (Signed)
Nutrition Brief Note  Patient identified on the Malnutrition Screening Tool (MST) Report.  Wt Readings from Last 15 Encounters:  12/27/16 182 lb 15.7 oz (83 kg)  12/05/16 194 lb 4.8 oz (88.1 kg)  10/22/16 178 lb (80.7 kg)  09/30/16 180 lb (81.6 kg)  08/19/16 176 lb 12.8 oz (80.2 kg)  06/18/16 168 lb 9.6 oz (76.5 kg)  05/21/16 152 lb (68.9 kg)  04/09/16 159 lb 9.6 oz (72.4 kg)  03/01/16 150 lb (68 kg)  02/08/16 147 lb 4.3 oz (66.8 kg)  01/30/16 142 lb 9.6 oz (64.7 kg)  01/28/16 141 lb 4.8 oz (64.1 kg)  09/21/15 133 lb (60.3 kg)  10/03/14 142 lb (64.4 kg)  07/06/14 158 lb (71.7 kg)   Body mass index is 27.02 kg/m. Patient meets criteria for Overweight based on current BMI.   Current diet order is Heart Healthy/Carbohydrate Modified. Reports a good appetite. Labs and medications reviewed.   Nutrition focused physical exam completed.  No muscle or subcutaneous fat depletion noticed.  No nutrition interventions warranted at this time. If nutrition issues arise, please consult RD.   Maureen ChattersKatie Cailynn Bodnar, RD, LDN Pager #: [phone] After-Hours Pager #: [phone]

## 2016-12-27 NOTE — Consult Note (Signed)
Name: Kim [name] MRN: [id number] DOB: 28-May-1940    ADMISSION DATE:  09-Jan-2017 CONSULTATION DATE:  12/27/2016  REFERRING MD :  Waymon Amato  CHIEF COMPLAINT:  Acute on chronic respiratory failure in setting of progressive ILD.   BRIEF PATIENT DESCRIPTION:  Elderly chronically ill appearing female supine in bed on BIPAP, RR 39, Saturations 99% on 40%, IPAP of 10, EPAP of 5, Rate of 8.   SIGNIFICANT EVENTS  Recent Admission 12/04/2016-12/09/2016 for ILD Flare 09-Jan-2017>> Admission  STUDIES:  CXR 2017-01-09>>Stable exam. 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  HRCT 12/05/2016>>  Progressive basilar predominant severe fibrotic interstitial lung disease with extensive honeycombing, diagnostic of usual interstitial pneumonia (UIP). Stable trace dependent bilateral pleural effusions/mild pleural thickening. Three-vessel coronary atherosclerosis. Stable mild reactive mediastinal lymphadenopathy.  Cultures:  01/09/2017>> RVP>> Negative January 09, 2017>> Blood>>  HISTORY OF PRESENT ILLNESS:  Kim [name] is a 76 y.o. female former smoker with medical history significant of ILD from remote lung injury, HTN, HLD, GERD, DM. She is followed in the Pulmonary Office by Dr. Sherene Sires. She is prednisone dependent  On 15 mg daily, she is oxygen dependent (2L at rest, 6L with exertion). Patient presented from her primary care physician's office and acute respiratory failure on 01/09/2017.Marland Kitchen Patient arrived  by private vehicle after developing rapid onset shortness of breath with noted hypoxia despite supplemental oxygen administration. Patient reports several day history of worsening cough and shortness of breath. Cough is nonproductive and associated with chills but denies fevers, lower extremity swelling, chest pain, orthopnea. Time of arrival in the emergency room triage area patient was noted to be hypoxic with O2 saturations in the  mid 60s despite being on her home O2 of 2 L. Patient required nebulizer treatments and steroids with oxygen administration to 6 L in order to get O2 saturations above 90%. WBC 13.2, negative troponin, lipase 23, d-dimer 0.62, stable renal function, temperature normal. No chest pain or signs of DVT. She endorsed nausea with vomiting and diarrhea  10/31. She was admitted to SDU, BiPAP was started, Scheduled BD's, IV steroids and ABX therapy was initiated. PCCM have been asked to consult for assistance with pulmonary/ BiPAP  management due to increased oxygen demands. Patient had a recent admission ILD flare 12/04/2016-12/09/2016.   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  has a past surgical history that includes Abdominal hysterectomy; Small intestine surgery; Hernia repair; Colonoscopy; and Total knee arthroplasty (Right, 07/06/2014). Prior to Admission medications   Medication Sig Start Date End Date Taking? Authorizing Provider  acetaminophen (TYLENOL) 500 MG tablet Take 500 mg by mouth every 8 (eight) hours as needed (for pain or headaches). Use per bottle as needed.   Yes [provider]  aspirin EC 81 MG tablet Take 81 mg by mouth daily.   Yes [provider]  benzonatate (TESSALON) 200 MG capsule Take 200 mg by mouth every 8 (eight) hours as needed for cough.   Yes [provider]  famotidine (PEPCID) 20 MG tablet TAKE 1 TABLET (20 MG TOTAL) BY MOUTH AT BEDTIME. 07/04/16  Yes Nyoka Cowden, MD  guaiFENesin (MUCINEX) 600 MG 12 hr tablet Take 2 tablets (1,200 mg total) by mouth 2 (two) times  daily. 12/09/16   Yes Maxie BarbBhandari, Dron Prasad, MD  levothyroxine (SYNTHROID, LEVOTHROID) 75 MCG tablet Take 75 mcg by mouth daily before breakfast.   Yes [provider]  losartan-hydrochlorothiazide (HYZAAR) 100-12.5 MG per tablet Take 1 tablet by mouth daily.   Yes [provider]  metFORMIN (GLUCOPHAGE) 500 MG tablet TAKE 1 TABLET BY MOUTH TWICE A DAY Patient taking differently: Take 500 mg by mouth two times a day 03/31/13  Yes Worthy RancherWebb, Padonda B, FNP  OXYGEN Inhale 2-6 L into the lungs See admin instructions. 2 liters at rest and 6 liters with activity   Yes [provider]  predniSONE (DELTASONE) 10 MG tablet Take 4 tablets (40 mg total) by mouth daily. Take 40 mg for 3 days, 30 mg for 3 days and then 20 mg daily. 12/09/16  Yes Maxie BarbBhandari, Dron Prasad, MD  pantoprazole (PROTONIX) 40 MG tablet Take 1 tablet (40 mg total) by mouth daily. Take 30-60 min before first meal of the day Patient not taking: Reported on 12/04/2016 08/22/16   Nyoka CowdenWert, Michael B, MD  predniSONE (DELTASONE) 10 MG tablet TAKE 1 & 1/2 TABLET EVERY DAY WITH BREAKFAST 12/16/16   Nyoka CowdenWert, Michael B, MD   Allergies  Allergen Reactions  . Codeine Nausea And Vomiting and Other (See Comments)    "Takes my appetite" also  . Penicillins Rash    Has patient had a PCN reaction causing immediate rash, facial/tongue/throat swelling, SOB or lightheadedness with hypotension: Yes Has patient had a PCN reaction causing severe rash involving mucus membranes or skin necrosis: No Has patient had a PCN reaction that required hospitalization No Has patient had a PCN reaction occurring within the last 10 years: No If all of the above answers are "NO", then may proceed with Cephalosporin use.     FAMILY HISTORY:  family history includes Heart attack in her sister; Stomach cancer in her sister. SOCIAL HISTORY:  reports that she quit smoking about 28 years ago. Her smoking use included Cigarettes. She has a 5.00 pack-year smoking history. She has  never used smokeless tobacco. She reports that she does not drink alcohol or use drugs.  REVIEW OF SYSTEMS:   Constitutional: Negative for fever, chills, weight loss, malaise/fatigue and diaphoresis. Poor appetite HENT: Negative for hearing loss, ear pain, nosebleeds, congestion, sore throat, neck pain, tinnitus and ear discharge.   Eyes: Negative for blurred vision, double vision, photophobia, pain, discharge and redness.  Respiratory: Positive  for cough x 2 weeks, no hemoptysis, + sputum production, + shortness of breath, no wheezing and stridor.   Cardiovascular: Negative for chest pain, palpitations, orthopnea, claudication, leg swelling and PND.  Gastrointestinal: Negative for heartburn,+  nausea, vomiting, no abdominal pain, + diarrhea, no constipation, blood in stool and melena.  Genitourinary: Negative for dysuria, urgency, frequency, hematuria and flank pain.  Musculoskeletal: + for myalgias, back pain, joint pain and falls, + for shoulder pain .  Skin: Negative for itching and rash.  Neurological: Negative for dizziness, tingling, tremors, sensory change, speech change, focal weakness, seizures, loss of consciousness, weakness and + headaches.  Endo/Heme/Allergies: Negative for environmental allergies and polydipsia. Does not bruise/bleed easily.  SUBJECTIVE:  States she is feeling better on BIPAP. States she missed her appointment at Dr. Thurston HoleWert's office 01/12/2017 due to lack of transportation. She states she is hungry.  VITAL SIGNS: Temp:  [96.9 F (36.1 C)-98.1 F (36.7 C)] 96.9 F (36.1 C) (11/02 0827) Pulse Rate:  [87-131] 125 (11/02 0827) Resp:  [27-47] 30 (11/02 0827)  BP: (119-165)/(52-91) 165/81 (11/02 0827) SpO2:  [85 %-100 %] 85 % (11/02 0827) FiO2 (%):  [40 %-100 %] 40 % (11/02 0752) Weight:  [182 lb 15.7 oz (83 kg)] 182 lb 15.7 oz (83 kg) (11/02 0827)  PHYSICAL EXAMINATION: General:  Elderly female in mild respiratory distress on BiPAP. Neuro:  Awake and alert,  cranial nerved intact.Moves all extremities x 4  HEENT: Normocephalic. Atraumatic, BIPAP mask to face, No LAD, No JVD, no discharge Cardiovascular: S1, S2, RRR No RMG, rare PAC per telemetry Lungs: Crackles throughout, diminished per bases, no accessory muscle use on BIPAP. Abdomen:  Soft, Flat, Non-distended, Non-tender, BS diminished Musculoskeletal: No obvious deformities, no joint redness or warmth Skin:  Warm Dry and intact, no rash or lesions noted   Recent Labs Lab 01/01/2017 1749  NA 135  K 4.4  CL 96*  CO2 28  BUN 14  CREATININE 1.13*  GLUCOSE 196*    Recent Labs Lab 01/01/2017 1749  HGB 11.3*  HCT 36.9  WBC 13.2*  PLT 328   Dg Chest Port 1 View  Result Date: 01/06/2017 CLINICAL DATA:  Shortness of breath. EXAM: PORTABLE CHEST 1 VIEW COMPARISON:  Chest CT 12/05/2016.  Chest x-ray 12/05/2016. FINDINGS: 1956 hours. Stable asymmetric elevation right hemidiaphragm. Diffuse interstitial lung disease again noted, similar to prior. The cardiopericardial silhouette is within normal limits for size. The visualized bony structures of the thorax are intact. Telemetry leads overlie the chest. IMPRESSION: Stable exam. 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 Electronically Signed   By: Kennith Center M.D.   On: 01/16/2017 20:27    ASSESSMENT / PLAN:  Acute on Chronic Respiratory Failure: 2/2 Progressive End Stage Pulmonary Fibrosis Flare 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 BNP= 45 Afebrile, RR 39  Plan: - BiPAP continuous for now, can use prn if she improves - NPO - Titrate oxygen HFNC for Saturation Goals > 88% - CXR prn - Continue IV Solumedrol 125 mg  TID per primary - Scheduled Duonebs  - Xopenex nebs prn Q 2 - Cough suppression prn( Uses Benzonatate at home) - Prophylactic ABX per  primary as she is chronically immunosupressed - Follow Micro - DNR per her request - Aggressive pulmonary toilet when able - Mucinex 1200 mg twice daily when  Able - Minimize sedation - Palliative care consult  Discussion: This is another flare of Ayrianna's progressive ILD. She was most recently hospitalized 10/10-10/15/2018. Agree with BiPAP and IV solumedrol, ABX per primary. If she does not improve over the weekend on high dose IV steroids, palliation/ hospice should be considered.She was seen by palliation 12/06/2016 during her last admission. At that time she  was not deemed a hospice candidate due to her response to steroids . She understands her lung disease is progressing, irreversible, and symptom management is getting more futile. Consideration should be given to optimization of pulmonary status as an inpatient and then discharge home with Palliation or Hospice Care/ Support.  Bevelyn Ngo, AGACNP-BC Pulmonary and Critical Care Medicine [facility] Pager: [phone]  12/27/2016, 8:57 AM

## 2016-12-28 ENCOUNTER — Inpatient Hospital Stay (HOSPITAL_COMMUNITY): Payer: Medicare Other

## 2016-12-28 DIAGNOSIS — J810 Acute pulmonary edema: Secondary | ICD-10-CM

## 2016-12-28 DIAGNOSIS — R0902 Hypoxemia: Secondary | ICD-10-CM

## 2016-12-28 LAB — GLUCOSE, CAPILLARY
GLUCOSE-CAPILLARY: 142 mg/dL — AB (ref 65–99)
GLUCOSE-CAPILLARY: 159 mg/dL — AB (ref 65–99)
Glucose-Capillary: 230 mg/dL — ABNORMAL HIGH (ref 65–99)
Glucose-Capillary: 315 mg/dL — ABNORMAL HIGH (ref 65–99)

## 2016-12-28 MED ORDER — SODIUM CHLORIDE 0.9 % IV SOLN
INTRAVENOUS | Status: DC
  Administered 2016-12-28 (×2): via INTRAVENOUS

## 2016-12-28 MED ORDER — MORPHINE SULFATE (CONCENTRATE) 10 MG/0.5ML PO SOLN
5.0000 mg | ORAL | Status: DC | PRN
  Administered 2016-12-28: 5 mg via ORAL
  Filled 2016-12-28: qty 0.5

## 2016-12-28 MED ORDER — INSULIN GLARGINE 100 UNIT/ML ~~LOC~~ SOLN
10.0000 [IU] | Freq: Every day | SUBCUTANEOUS | Status: DC
  Administered 2016-12-28 – 2016-12-31 (×4): 10 [IU] via SUBCUTANEOUS
  Filled 2016-12-28 (×5): qty 0.1

## 2016-12-28 MED ORDER — LORAZEPAM 2 MG/ML IJ SOLN
0.5000 mg | Freq: Four times a day (QID) | INTRAMUSCULAR | Status: DC | PRN
  Administered 2016-12-28 – 2016-12-30 (×6): 0.5 mg via INTRAVENOUS
  Filled 2016-12-28 (×6): qty 1

## 2016-12-28 MED ORDER — FUROSEMIDE 10 MG/ML IJ SOLN
40.0000 mg | Freq: Once | INTRAMUSCULAR | Status: AC
  Administered 2016-12-28: 40 mg via INTRAVENOUS
  Filled 2016-12-28: qty 4

## 2016-12-28 NOTE — Progress Notes (Signed)
Pt requested to be taken off BIPAP. Family at bedside  Pt tolerating well.  SPO2 99% on 15L HFC.  No distress

## 2016-12-28 NOTE — Progress Notes (Signed)
PROGRESS NOTE   Kim [name]  WJX:[id number]    DOB: 01/23/1941    DOA: 12/27/2016  PCP: Renaye Rakers, MD   I have briefly reviewed patients previous medical records in [facility].  Brief Narrative:  76 year old female, lives with family, independent of activities, PMH of severe ILD, chronic hypoxic respiratory failure >oxygen dependent on 2 L oxygen at rest and 6 L with activity & steroid dependent (Dr. Sherene Sires, pulmonology), HTN, HLD, GERD, DM, hypothyroid, stage II chronic kidney disease reports progressively worsening dyspnea over the past couple of days, chronic cough with intermittent clear sputum which is not changed, chills without fevers and no chest pains. Denies sick contacts at home. Self-limiting nausea, vomiting and diarrhea 2 days ago. In the ED hypoxic in the 60s on 2 L. Has been on BiPAP since ED arrival 11/1. Chest x-ray shows pulmonary fibrosis, BNP normal, RSV panel negative. Admitted to stepdown for acute on chronic hypoxic respiratory failure due to pulmonary fibrosis flare. Pulmonology consulted.   Assessment & Plan:   Principal Problem:   Acute on chronic respiratory failure with hypoxia (HCC) Active Problems:   HTN (hypertension)   Hypothyroidism   Sepsis (HCC)   Chronic kidney disease (CKD), stage II (mild)   Nausea vomiting and diarrhea   Type II diabetes mellitus with renal manifestations (HCC)   Encounter for palliative care   Acute on chronic hypoxic respiratory failure secondary to severe/end-stage pulmonary fibrosis flare - At baseline patient is oxygen dependent (2L at rest and 6L with activity) and steroid dependent (15 mg prednisone daily). - Low index of suspicion for infectious etiology. No fever, only mild leukocytosis, RSV panel negative, chest x-ray shows chronic interstitial lung disease without pneumonia. Blood cultures pending. - Has been on BiPAP since arrival to ED and brief attempts to wean off BiPAP unsuccessful and she easily  desaturates. - Pulmonology consultation appreciated. - Continue IV Solu-Medrol 125 MG 3 times a day, scheduled DuoNeb's, Xopenex when necessary, antitussives, empiric azithromycin - Continue BiPAP and try to wean off BiPAP as tolerated. - Agree with pulmonology that if she does not improve in the next day or 2, palliative care may be appropriate. We will consult palliative care team had seen her during prior admission. - D-dimer 0.62. Low index of suspicion for PE. - overnight events noted.  Patient had been transitioned off BiPAP to high flow nasal cannula oxygen at 10 L/min.  Patient again extremely dyspneic and anxious this morning.  Discussed in detail with pulmonology, NPO and back on BiPAP.   -Poor prognosis.  Continue to attempt to optimize pulmonary status so that she can be discharged either to SNF on home with palliative care or hospice support. -May consider adding low-dose morphine for air hunger. - Repeat chest x-ray 11/3 without change  SIRS - Likely related to acute respiratory failure rather than true sepsis. - Influenza panel PCR and RSV panel PCR negative. Lactate normalized. PCT Neg - Continue azithromycin. Reduced IV fluids.  DM 2 - Last A1c: 6. Hold metformin. SSI.  Now uncontrolled, precipitated by steroids.  Add Lantus 10 units daily.  Monitor and adjust insulins as needed.  Essential hypertension Controlled. Currently on Hyzaar. When necessary IV hydralazine.  Hypothyroid TSH 0.779 on 02/06/16. Continue Synthroid.  Stage II chronic kidney disease: Baseline creatinine 1-1.1. Presented with creatinine of 1.13. Stable.  Self-limiting nausea, vomiting and diarrhea Seems to have resolved. Monitor.  Microcytic anemia Stable.  . DVT prophylaxis: Lovenox  Code Status: DO NOT RESUSCITATE  Family Communication: None at bedside  Disposition: Not medically stable for discharge.   Consultants:  Pulmonology Palliative care team   Procedures:  BiPAP    Antimicrobials:  Azithromycin    Subjective: overnight events noted.  Patient had been transitioned off BiPAP to high flow nasal cannula oxygen at 10 L/min.  Patient again extremely dyspneic and anxious this morning.  ROS: As above.  Objective:  Vitals:   12/28/16 0410 12/28/16 0737 12/28/16 0750 12/28/16 0935  BP: 119/64  127/67   Pulse: 78  (!) 110 (!) 107  Resp: (!) 32  (!) 28   Temp: (!) 97.3 F (36.3 C)  (!) 97.5 F (36.4 C)   TempSrc: Oral  Oral   SpO2: 99% 93% 92% 90%  Weight:      Height:        Examination:  General exam: Elderly female, moderately built and nourished, chronically ill looking, lying propped up in bed on HFNC oxygen with moderate respiratory distress and using accessory muscles.Marland Kitchen Respiratory system: markedly reduced breath sounds bilaterally with scattered extensive bilateral coarse/Velcro-like chronic crackles. No wheezing or rhonchi appreciated.  No real change or improvement compared to yesterday. Cardiovascular system: S1 & S2 heard, RRR. No JVD, murmurs, rubs, gallops or clicks. No pedal edema. Telemetry: Sinus tachycardia. Gastrointestinal system: Abdomen is nondistended, soft and nontender. No organomegaly or masses felt. Normal bowel sounds heard. Central nervous system: Alert and oriented. No focal neurological deficits. Extremities: Symmetric 5 x 5 power. Skin: No rashes, lesions or ulcers Psychiatry: Judgement and insight appear normal. Mood & affect anxious.    Data Reviewed: I have personally reviewed following labs and imaging studies  CBC:  Recent Labs Lab 01/13/2017 1749  WBC 13.2*  NEUTROABS 12.0*  HGB 11.3*  HCT 36.9  MCV 77.7*  PLT 328   Basic Metabolic Panel:  Recent Labs Lab 01/05/2017 1749  NA 135  K 4.4  CL 96*  CO2 28  GLUCOSE 196*  BUN 14  CREATININE 1.13*  CALCIUM 8.8*   Liver Function Tests:  Recent Labs Lab 01/07/2017 1749  AST 18  ALT 13*  ALKPHOS 67  BILITOT 0.5  PROT 6.4*  ALBUMIN 3.0*    CBG:  Recent Labs Lab 12/27/16 1220 12/27/16 1710 12/27/16 2045 12/27/16 2242 12/28/16 0747  GLUCAP 161* 156* 279* 268* 230*    Recent Results (from the past 240 hour(s))  Culture, blood (routine x 2) Call MD if unable to obtain prior to antibiotics being given     Status: None (Preliminary result)   Collection Time: 01/02/2017 11:07 PM  Result Value Ref Range Status   Specimen Description BLOOD RIGHT HAND  Final   Special Requests   Final    BOTTLES DRAWN AEROBIC AND ANAEROBIC Blood Culture adequate volume   Culture PENDING  Incomplete   Report Status PENDING  Incomplete  Respiratory Panel by PCR     Status: None   Collection Time: 01/22/2017 11:10 PM  Result Value Ref Range Status   Adenovirus NOT DETECTED NOT DETECTED Final   Coronavirus 229E NOT DETECTED NOT DETECTED Final   Coronavirus HKU1 NOT DETECTED NOT DETECTED Final   Coronavirus NL63 NOT DETECTED NOT DETECTED Final   Coronavirus OC43 NOT DETECTED NOT DETECTED Final   Metapneumovirus NOT DETECTED NOT DETECTED Final   Rhinovirus / Enterovirus NOT DETECTED NOT DETECTED Final   Influenza A NOT DETECTED NOT DETECTED Final   Influenza B NOT DETECTED NOT DETECTED Final   Parainfluenza Virus 1 NOT DETECTED NOT  DETECTED Final   Parainfluenza Virus 2 NOT DETECTED NOT DETECTED Final   Parainfluenza Virus 3 NOT DETECTED NOT DETECTED Final   Parainfluenza Virus 4 NOT DETECTED NOT DETECTED Final   Respiratory Syncytial Virus NOT DETECTED NOT DETECTED Final   Bordetella pertussis NOT DETECTED NOT DETECTED Final   Chlamydophila pneumoniae NOT DETECTED NOT DETECTED Final   Mycoplasma pneumoniae NOT DETECTED NOT DETECTED Final  MRSA PCR Screening     Status: None   Collection Time: 12/27/16  8:23 AM  Result Value Ref Range Status   MRSA by PCR NEGATIVE NEGATIVE Final    Comment:        The GeneXpert MRSA Assay (FDA approved for NASAL specimens only), is one component of a comprehensive MRSA colonization surveillance  program. It is not intended to diagnose MRSA infection nor to guide or monitor treatment for MRSA infections.          Radiology Studies: Dg Chest Port 1 View  Result Date: 12/28/2016 CLINICAL DATA:  76 year old female with respiratory failure. EXAM: PORTABLE CHEST 1 VIEW COMPARISON:  01/18/2017 FINDINGS: Cardiomediastinal silhouette is unchanged. Diffuse interstitial lung disease is again noted, similar to prior. No definite superimposed parenchymal opacities, though evaluation is markedly limited. Stable elevation of the right hemidiaphragm. No significant effusions or pneumothorax. No acute osseous changes. IMPRESSION: Unchanged examination demonstrating chronic interstitial lung disease. Electronically Signed   By: Sande BrothersSerena  Chacko M.D.   On: 12/28/2016 09:17   Dg Chest Port 1 View  Result Date: 01/10/2017 CLINICAL DATA:  Shortness of breath. EXAM: PORTABLE CHEST 1 VIEW COMPARISON:  Chest CT 12/05/2016.  Chest x-ray 12/05/2016. FINDINGS: 1956 hours. Stable asymmetric elevation right hemidiaphragm. Diffuse interstitial lung disease again noted, similar to prior. The cardiopericardial silhouette is within normal limits for size. The visualized bony structures of the thorax are intact. Telemetry leads overlie the chest. IMPRESSION: Stable exam. 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 Electronically Signed   By: Kennith CenterEric  Mansell M.D.   On: 12/28/2016 20:27        Scheduled Meds: . aspirin EC  81 mg Oral Daily  . azithromycin  250 mg Oral Daily  . enoxaparin (LOVENOX) injection  40 mg Subcutaneous Q24H  . famotidine  20 mg Oral QHS  . guaiFENesin  1,200 mg Oral BID  . hydrochlorothiazide  12.5 mg Oral Daily  . insulin aspart  0-5 Units Subcutaneous QHS  . insulin aspart  0-9 Units Subcutaneous TID WC  . ipratropium-albuterol  3 mL Nebulization QID  . levothyroxine  75 mcg Oral QAC breakfast  . losartan  100 mg Oral  Daily  . methylPREDNISolone (SOLU-MEDROL) injection  125 mg Intravenous TID   Continuous Infusions:    LOS: 2 days     Mohd Clemons, MD, FACP, FHM. [facility] Pager [phone]  If 7PM-7AM, please contact night-coverage [email or web address] Password [email or web address] 12/28/2016, 9:55 AM

## 2016-12-28 NOTE — Plan of Care (Signed)
Problem: Pain Managment: Goal: General experience of comfort will improve Outcome: Progressing Pt able to indicate pain level, location and intensity using numerical scale  Problem: Physical Regulation: Goal: Ability to maintain clinical measurements within normal limits will improve Outcome: Not Progressing Pt has to be placed back on bipap by RT to assist with work of breathing and to increase O2 sats Goal: Will remain free from infection Outcome: Progressing Pt educated on infection control; verbalized understanding   Problem: Skin Integrity: Goal: Risk for impaired skin integrity will decrease Outcome: Progressing Patient able to assist with rolls and transfers as needed. No s&s of breakdown  Problem: Activity: Goal: Risk for activity intolerance will decrease Outcome: Not Progressing Patient isn't able to tolerate a lot of movement due to respiratory effort  Problem: Fluid Volume: Goal: Ability to maintain a balanced intake and output will improve Outcome: Progressing IV fluids decreased per MD order   Problem: Nutrition: Goal: Adequate nutrition will be maintained Outcome: Not Progressing Pt NPO due to respiratory effort

## 2016-12-28 NOTE — Consult Note (Signed)
Name: Kim [name] MRN: [id number] DOB: Sep 27, 1940    ADMISSION DATE:  01/14/2017 CONSULTATION DATE:  12/27/2016  REFERRING MD :  Waymon Amato  CHIEF COMPLAINT:  Acute on chronic respiratory failure in setting of progressive ILD.   BRIEF PATIENT DESCRIPTION:  Elderly chronically ill appearing female supine in bed on BIPAP, RR 39, Saturations 99% on 40%, IPAP of 10, EPAP of 5, Rate of 8.   SIGNIFICANT EVENTS  Recent Admission 12/04/2016-12/09/2016 for ILD Flare 01/01/2017>> Admission  STUDIES:  CXR 12/30/2016>>Stable exam. 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  HRCT 12/05/2016>>  Progressive basilar predominant severe fibrotic interstitial lung disease with extensive honeycombing, diagnostic of usual interstitial pneumonia (UIP). Stable trace dependent bilateral pleural effusions/mild pleural thickening. Three-vessel coronary atherosclerosis. Stable mild reactive mediastinal lymphadenopathy.  Cultures:  01/19/2017>> RVP>> Negative 01/23/2017>> Blood>>  HISTORY OF PRESENT ILLNESS:  Kim [name] is a 76 y.o. female former smoker with medical history significant of ILD from remote lung injury, HTN, HLD, GERD, DM. She is followed in the Pulmonary Office by Dr. Sherene Sires. She is prednisone dependent  On 15 mg daily, she is oxygen dependent (2L at rest, 6L with exertion). Patient presented from her primary care physician's office and acute respiratory failure on 01/18/2017.Marland Kitchen Patient arrived  by private vehicle after developing rapid onset shortness of breath with noted hypoxia despite supplemental oxygen administration. Patient reports several day history of worsening cough and shortness of breath. Cough is nonproductive and associated with chills but denies fevers, lower extremity swelling, chest pain, orthopnea. Time of arrival in the emergency room triage area patient was noted to be hypoxic with O2 saturations in the  mid 60s despite being on her home O2 of 2 L. Patient required nebulizer treatments and steroids with oxygen administration to 6 L in order to get O2 saturations above 90%. WBC 13.2, negative troponin, lipase 23, d-dimer 0.62, stable renal function, temperature normal. No chest pain or signs of DVT. She endorsed nausea with vomiting and diarrhea  10/31. She was admitted to SDU, BiPAP was started, Scheduled BD's, IV steroids and ABX therapy was initiated. PCCM have been asked to consult for assistance with pulmonary/ BiPAP  management due to increased oxygen demands. Patient had a recent admission ILD flare 12/04/2016-12/09/2016.   SUBJECTIVE:  Off BiPAP since pm 11/3. On 10 L high flow oxygen. States she is still short of breath. Drinking coffee VITAL SIGNS: Temp:  [97.3 F (36.3 C)-97.8 F (36.6 C)] 97.5 F (36.4 C) (11/03 0750) Pulse Rate:  [73-110] 110 (11/03 0750) Resp:  [20-40] 28 (11/03 0750) BP: (114-148)/(63-95) 127/67 (11/03 0750) SpO2:  [92 %-100 %] 92 % (11/03 0750) Weight:  [196 lb 3.4 oz (89 kg)] 196 lb 3.4 oz (89 kg) (11/02 1959)  PHYSICAL EXAMINATION: General:  Elderly female in mild respiratory distress on high flow oxygen at 10 L Neuro:  Awake and alert, cranial nerved intact.Moves all extremities x 4  HEENT: Normocephalic. Atraumatic, High flow nasal cannula, No LAD, No JVD, no discharge Cardiovascular: S1, S2, RRR No RMG, continued rare PAC per telemetry Lungs: Crackles throughout, diminished per bases, accessory muscle use noted  Abdomen:  Soft, Flat, Non-distended, Non-tender, BS diminished Musculoskeletal: No obvious deformities, no joint redness or warmth Skin:  Warm Dry and intact, no rash or lesions noted   Recent Labs Lab 01/15/2017 1749  NA 135  K 4.4  CL 96*  CO2 28  BUN 14  CREATININE 1.13*  GLUCOSE 196*  Recent Labs Lab 2016-07-01 1749  HGB 11.3*  HCT 36.9  WBC 13.2*  PLT 328   Dg Chest Port 1 View  Result Date: 01/12/2017 CLINICAL DATA:   Shortness of breath. EXAM: PORTABLE CHEST 1 VIEW COMPARISON:  Chest CT 12/05/2016.  Chest x-ray 12/05/2016. FINDINGS: 1956 hours. Stable asymmetric elevation right hemidiaphragm. Diffuse interstitial lung disease again noted, similar to prior. The cardiopericardial silhouette is within normal limits for size. The visualized bony structures of the thorax are intact. Telemetry leads overlie the chest. IMPRESSION: Stable exam. 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 Electronically Signed   By: Kennith CenterEric  Mansell M.D.   On: August 08, 2016 20:27    ASSESSMENT / PLAN:  Acute on Chronic Respiratory Failure: 2/2 Progressive End Stage Pulmonary Fibrosis Flare 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 BNP= 45 Afebrile, RR 39  Plan: - Place on BIPAP now - NPO while on BiPAP - Titrate oxygen HFNC for Saturation Goals > 88% - CXR prn - Continue IV Solumedrol 125 mg  TID per primary>> thus far has not responded to steroids - Scheduled Duonebs  - Xopenex nebs prn Q 2 - Cough suppression prn( Uses Benzonatate at home) - Prophylactic ABX per primary as she is chronically immunosupressed - Follow Micro - DNR per her request - Aggressive pulmonary toilet when able - Mucinex 1200 mg twice daily when  Able - Minimize sedation - Palliative care consult>> as she is less responsive to steroids this admission   Discussion: This is another flare of Kim [name]'s progressive ILD. She was most recently hospitalized 10/10-10/15/2018. Agree with BiPAP and IV solumedrol, ABX per primary. If she does not improve over the weekend on high dose IV steroids, palliation/ hospice should be considered.She was seen by palliation 12/06/2016 during her last admission. At that time she  was not deemed a hospice candidate due to her response to steroids . She  understands her lung disease is progressing, irreversible, and symptom management is getting more futile. Consideration should be given to optimization of pulmonary status as an inpatient and then discharge home with Palliation or Hospice Care/ Support.    Bevelyn NgoSarah F. Groce, AGACNP-BC Pulmonary and Critical Care Medicine [facility] Pager: [phone]  12/28/2016, 9:02 AM  Attending Note:  76 year old female with ILD who was just got discharged 2 wks ago then returns back to the hospital with respiratory failure and now on BiPAP.  On high flow O2 she was using accessory muscles but more comfortable with BiPAP.  Diffuse crackles on exam.  I reviewed CXR myself, fibrotic changes noted.  Discussed with PCCM-NP.  Acute on chronic respiratory failure:  - BiPAP as needed during the day.  - Mandatory BiPAP at night.  Pulmonary edema:  - KVO IVF  - Lasix 40 mg IV x1.  Hypoxemia:  - Titrate O2 for sat of 88-92%.  IPF:  - Continue steroids via solumedrol 125 mg IV q8.  - Will continue through the weekend, if not improved then will have Dr. Sherene SiresWert come evaluate patient to determine if end-stage or not.  PCCM will follow.  Patient seen and examined, agree with above note.  I dictated the care and orders written for this patient under my direction.  Alyson ReedyYacoub, Barrington Worley G, MD [phone]

## 2016-12-29 ENCOUNTER — Other Ambulatory Visit: Payer: Self-pay

## 2016-12-29 ENCOUNTER — Inpatient Hospital Stay (HOSPITAL_COMMUNITY): Payer: Medicare Other

## 2016-12-29 DIAGNOSIS — J84112 Idiopathic pulmonary fibrosis: Secondary | ICD-10-CM

## 2016-12-29 LAB — GLUCOSE, CAPILLARY
GLUCOSE-CAPILLARY: 137 mg/dL — AB (ref 65–99)
GLUCOSE-CAPILLARY: 142 mg/dL — AB (ref 65–99)
GLUCOSE-CAPILLARY: 149 mg/dL — AB (ref 65–99)
Glucose-Capillary: 163 mg/dL — ABNORMAL HIGH (ref 65–99)
Glucose-Capillary: 132 mg/dL — ABNORMAL HIGH (ref 65–99)

## 2016-12-29 MED ORDER — METHYLPREDNISOLONE SODIUM SUCC 125 MG IJ SOLR
125.0000 mg | Freq: Three times a day (TID) | INTRAMUSCULAR | Status: DC
  Administered 2016-12-30 – 2017-01-02 (×11): 125 mg via INTRAVENOUS
  Filled 2016-12-29 (×11): qty 2

## 2016-12-29 MED ORDER — MORPHINE SULFATE (PF) 4 MG/ML IV SOLN
1.0000 mg | INTRAVENOUS | Status: DC | PRN
  Administered 2016-12-29 (×2): 1 mg via INTRAVENOUS
  Filled 2016-12-29 (×2): qty 1

## 2016-12-29 NOTE — Progress Notes (Signed)
Patient taken off of BiPAP per MD request, per RN. Placed on 15 Lpm HFNC. Patient sats dropped to 78% within a minute. Patient labored and tachypneic as well. Placed back on BiPAP. Rate remaining in the 40's, SATs back to 95%. RN notified.

## 2016-12-29 NOTE — Progress Notes (Signed)
Name: Kim [name] MRN: [id number] DOB: 06-03-1940    ADMISSION DATE:  01/06/17 CONSULTATION DATE:  12/27/2016  REFERRING MD :  Waymon Amato  CHIEF COMPLAINT:  Acute on chronic respiratory failure in setting of progressive ILD.   BRIEF PATIENT DESCRIPTION:  Elderly chronically ill appearing female supine in bed on BIPAP, RR 39, Saturations 99% on 40%, IPAP of 10, EPAP of 5, Rate of 8.   SIGNIFICANT EVENTS  Recent Admission 12/04/2016-12/09/2016 for ILD Flare January 06, 2017>> Admission  STUDIES:  CXR 2017/01/06>>Stable exam. 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  HRCT 12/05/2016>>  Progressive basilar predominant severe fibrotic interstitial lung disease with extensive honeycombing, diagnostic of usual interstitial pneumonia (UIP). Stable trace dependent bilateral pleural effusions/mild pleural thickening. Three-vessel coronary atherosclerosis. Stable mild reactive mediastinal lymphadenopathy.  Cultures:  06-Jan-2017>> RVP>> Negative 01/06/17>> Blood>>  HISTORY OF PRESENT ILLNESS:  Kim [name] is a 76 y.o. female former smoker with medical history significant of ILD from remote lung injury, HTN, HLD, GERD, DM. She is followed in the Pulmonary Office by Dr. Sherene Sires. She is prednisone dependent  On 15 mg daily, she is oxygen dependent (2L at rest, 6L with exertion). Patient presented from her primary care physician's office and acute respiratory failure on January 06, 2017.Marland Kitchen Patient arrived  by private vehicle after developing rapid onset shortness of breath with noted hypoxia despite supplemental oxygen administration. Patient reports several day history of worsening cough and shortness of breath. Cough is nonproductive and associated with chills but denies fevers, lower extremity swelling, chest pain, orthopnea. Time of arrival in the emergency room triage area patient was noted to be hypoxic with O2 saturations in the  mid 60s despite being on her home O2 of 2 L. Patient required nebulizer treatments and steroids with oxygen administration to 6 L in order to get O2 saturations above 90%. WBC 13.2, negative troponin, lipase 23, d-dimer 0.62, stable renal function, temperature normal. No chest pain or signs of DVT. She endorsed nausea with vomiting and diarrhea  10/31. She was admitted to SDU, BiPAP was started, Scheduled BD's, IV steroids and ABX therapy was initiated. PCCM have been asked to consult for assistance with pulmonary/ BiPAP  management due to increased oxygen demands. Patient had a recent admission ILD flare 12/04/2016-12/09/2016.   SUBJECTIVE: Unable to wean from BiPAP ( Tachypnea)  very anxious per nursing staff despite addition of morphine low dose prn pain/dyspnea  VITAL SIGNS: Temp:  [96.2 F (35.7 C)-97.8 F (36.6 C)] 96.9 F (36.1 C) (11/04 0909) Pulse Rate:  [93-136] 106 (11/04 0909) Resp:  [27-44] 37 (11/04 0909) BP: (133-172)/(74-150) 172/150 (11/04 0909) SpO2:  [87 %-99 %] 93 % (11/04 0909) FiO2 (%):  [55 %-60 %] 60 % (11/04 0845) Weight:  [182 lb 12.2 oz (82.9 kg)] 182 lb 12.2 oz (82.9 kg) (11/04 0450)  PHYSICAL EXAMINATION: General:  Elderly female in mild respiratory distress on BiPAP. Neuro:  Awake and alert, cranial nerved intact.Moves all extremities x 4, anxious HEENT: Normocephalic. Atraumatic, BIPAP mask to face, some facial edema, No LAD, No JVD, no discharge Cardiovascular: S1, S2, RRR No RMG, rare ST  per telemetry Lungs: Crackles throughout, diminished per bases, + accessory muscle use on BIPAP. Abdomen:  Soft, Flat, Non-distended, Non-tender, BS diminished Musculoskeletal: No obvious deformities, no joint redness or warmth Skin:  Warm Dry and intact, no rash or lesions noted  Recent Labs  Lab Jan 06, 2017 1749  NA 135  K 4.4  CL 96*  CO2  28  BUN 14  CREATININE 1.13*  GLUCOSE 196*   Recent Labs  Lab 30-Dec-2016 1749  HGB 11.3*  HCT 36.9  WBC 13.2*  PLT 328    Dg Chest Port 1 View  Result Date: 12/29/2016 CLINICAL DATA:  Respiratory failure. EXAM: PORTABLE CHEST 1 VIEW COMPARISON:  Yesterday. FINDINGS: The heart remains normal in size. No significant change in diffuse prominence of the interstitial markings in both lungs. Unremarkable bones. IMPRESSION: Stable marked interstitial fibrosis.  No acute abnormality. Electronically Signed   By: Beckie Salts M.D.   On: 12/29/2016 07:26   Dg Chest Port 1 View  Result Date: 12/28/2016 CLINICAL DATA:  76 year old female with respiratory failure. EXAM: PORTABLE CHEST 1 VIEW COMPARISON:  12-30-2016 FINDINGS: Cardiomediastinal silhouette is unchanged. Diffuse interstitial lung disease is again noted, similar to prior. No definite superimposed parenchymal opacities, though evaluation is markedly limited. Stable elevation of the right hemidiaphragm. No significant effusions or pneumothorax. No acute osseous changes. IMPRESSION: Unchanged examination demonstrating chronic interstitial lung disease. Electronically Signed   By: Sande Brothers M.D.   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  No acute abnormality. Anxious Unable to wean from BiPAP to HFNC 11/4 ( RR in 50's with increased WOB)  Plan: - BiPAP continuous for now, can use prn if she improves with HFNC - NPO - Titrate oxygen HFNC for Saturation Goals > 88% - CXR prn - Continue IV Solumedrol 125 mg  TID per primary - Scheduled Duonebs  - Xopenex nebs prn Q 2 - Cough suppression prn ( Uses Benzonatate at home) - Prophylactic ABX per primary as she is chronically immunosupressed - Follow Micro - DNR per her request - Aggressive  pulmonary toilet when able - Mucinex 1200 mg twice daily when  Able - Minimize sedation as able >> consider addition of low dose morphine for anxiety - Will need to address nutrition if remains on BiPAP and does not transition to hospice/ palliation - transition po meds to IV per primary - Palliative care consult appreciated  Discussion: Acute flare of  Bryanna's progressive ILD. She was most recently hospitalized 10/10-10/15/2018. Only 2 weeks between hospitalizations.  Agree with BiPAP and IV solumedrol, ABX per primary. If she does not improve over the weekend on high dose IV steroids, palliation/ hospice should be considered.She was seen by palliation 12/06/2016 during her last admission. At that time she  was not deemed a hospice candidate due to her response to steroids . She understands her lung disease is progressing, irreversible, and symptom management is getting more futile. Consideration should be given to optimization of pulmonary status as an inpatient and then discharge home with Palliation or Hospice Care/ Support. Pt. Has not responded to steroids this admission which confirms end stage status of fibrosis, and supports Palliation/ Hospice as appropriate goal of care.   Bevelyn Ngo, AGACNP-BC Pulmonary and Critical Care Medicine [facility] Pager: [phone]  Attending Note:  77 year old female with ILD history who just got discharged 2 weeks prior then return back to the hospital with respiratory failure and now on BiPAP.  Currently on BiPAP with diffuse crackles on exam.  I reviewed chest CT myself fibrotic changes  noted.  Discussed with PCCM-NP.  Acute on chronic respiratory failure  - BiPAP as needed during the day  - Mandatory at night  Pulmonary edema  - KVO IVF  - Lasix 40 mg IV x1  Hypoxemia:  - Titrate O2 for sat of 88-92%  IPF:  - Continue steroids via solumedrol 125 mg IV q8.  - Will continue through the weekend, in not improved then will have  Dr. Sherene SiresWert come evaluate patient to determine if end-stage or not.  PCCM will f/u in AM.  Patient seen and examined, agree with above note.  I dictated the care and orders written for this patient under my direction.  Alyson ReedyYacoub, Wesam G, MD [phone]  12/29/2016, 11:52 AM

## 2016-12-29 NOTE — Progress Notes (Signed)
Patient was off BiPAP for approximately 30 minutes with stable vital signs. Suddenly, patient began coughing and became severely anxious. O2 saturations were as low as 59%, and the patient's fingertips were cyanotic. This RN quickly placed the patient on BiPAP and administered PRN morphine. The patient recovered in about 10 minutes with the help of the BiPAP, medication, and encouraging/supportive talk from this RN. Patient is now resting with stable vital signs.

## 2016-12-29 NOTE — Progress Notes (Signed)
Placed pt back on BIPAP at this time due to sats being 83 %. RT will continue to monitor.

## 2016-12-29 NOTE — Progress Notes (Signed)
Placed pt on 15 L HFNC to give her a break from bipap. Vital sign are stable at this time. Pt is aware that she could have to go back on bipap for the night. RT will continue to monitor.

## 2016-12-29 NOTE — Progress Notes (Signed)
Pt currently on HFNC @ 15L. RT will continue to monitor pt and place back on BIPAP when needed.

## 2016-12-29 NOTE — Progress Notes (Signed)
PROGRESS NOTE   Kim [name]  BJY:[id number]    DOB: Jun 06, 1940    DOA: 01/10/2017  PCP: Renaye RakersBland, Veita, MD   I have briefly reviewed patients previous medical [facility].  Brief Narrative:  76 year old female, lives with family, independent of activities, PMH of severe ILD, chronic hypoxic respiratory failure >oxygen dependent on 2 L oxygen at rest and 6 L with activity & steroid dependent (Dr. Sherene SiresWert, pulmonology), HTN, HLD, GERD, DM, hypothyroid, stage II chronic kidney disease reports progressively worsening dyspnea over the past couple of days, chronic cough with intermittent clear sputum which is not changed, chills without fevers and no chest pains. Denies sick contacts at home. Self-limiting nausea, vomiting and diarrhea 2 days ago. In the ED hypoxic in the 60s on 2 L. Has been on BiPAP since ED arrival 11/1. Chest x-ray shows pulmonary fibrosis, BNP normal, RSV panel negative. Admitted to stepdown for acute on chronic hypoxic respiratory failure due to pulmonary fibrosis flare. Pulmonology consulted.   Assessment & Plan:   Principal Problem:   Acute on chronic respiratory failure with hypoxia (HCC) Active Problems:   HTN (hypertension)   Hypothyroidism   Sepsis (HCC)   Chronic kidney disease (CKD), stage II (mild)   Nausea vomiting and diarrhea   Type II diabetes mellitus with renal manifestations (HCC)   Encounter for palliative care   Acute on chronic hypoxic respiratory failure secondary to severe/end-stage pulmonary fibrosis flare - At baseline patient is oxygen dependent (2L at rest and 6L with activity) and steroid dependent (15 mg prednisone daily). - Low index of suspicion for infectious etiology. No fever, only mild leukocytosis, RSV panel negative, chest x-ray shows chronic interstitial lung disease without pneumonia. Blood cultures negative to date - Continue IV Solu-Medrol 125 MG 3 times a day, scheduled DuoNeb's, Xopenex when necessary, antitussives,  empiric azithromycin - D-dimer 0.62. Low index of suspicion for PE. -Unable to liberate from BiPAP since admission.  As per pulmonology follow-up, BiPAP as needed during the day and mandatory at night, Lasix 40 mg IV x1. If patient does not improve with these measures by then primary pulmonologist Dr. Sherene SiresWert to discuss course with patient.  SIRS - Likely related to acute respiratory failure rather than true sepsis. - Influenza panel PCR and RSV panel PCR negative. Lactate normalized. PCT Neg - Continue azithromycin.  IV fluids discontinued  DM 2 - Last A1c: 6. Hold metformin. SSI.  Now uncontrolled, precipitated by steroids.  Added Lantus 10 units daily.  Monitor and adjust insulins as needed.  Better.  Essential hypertension Controlled. Currently on Hyzaar. When necessary IV hydralazine.  Hypothyroid TSH 0.779 on 02/06/16. Continue Synthroid.  Stage II chronic kidney disease: Baseline creatinine 1-1.1. Presented with creatinine of 1.13. Stable.  Self-limiting nausea, vomiting and diarrhea Seems to have resolved. Monitor.  Microcytic anemia Stable.  . DVT prophylaxis: Lovenox  Code Status: DO NOT RESUSCITATE  Family Communication: None at bedside  Disposition: Not medically stable for discharge.   Consultants:  Pulmonology Palliative care team   Procedures:  BiPAP   Antimicrobials:  Azithromycin    Subjective: Events over the last 24 hours noted.  Has been unable to consistently come off of BiPAP.  Back on BiPAP overnight.  Reports dry mouth and wanting to drink or eat something.  Ongoing dyspnea.  No chest pain.  ROS: As above.  Objective:  Vitals:   12/29/16 0845 12/29/16 0909 12/29/16 1205 12/29/16 1216  BP:  (!) 172/150 (!) 155/79 (!) 155/79  Pulse: (!) 105 (!) 106 (!) 107 (!) 104  Resp: (!) 42 (!) 37 (!) 37 (!) 47  Temp:  (!) 96.9 F (36.1 C) (!) 97.2 F (36.2 C)   TempSrc:  Axillary Axillary   SpO2: 95% 93% 95% 92%  Weight:      Height:         Examination:  General exam: Elderly female, moderately built and nourished, chronically ill looking, lying propped up in bed on BiPAP this morning. Respiratory system: markedly reduced breath sounds bilaterally with scattered extensive bilateral coarse/Velcro-like chronic crackles. No wheezing or rhonchi appreciated.  No significant change noted since admission. Cardiovascular system: S1 & S2 heard, RRR. No JVD, murmurs, rubs, gallops or clicks. No pedal edema. Telemetry: Sinus tachycardia in the 100s-120s. Gastrointestinal system: Abdomen is nondistended, soft and nontender. No organomegaly or masses felt. Normal bowel sounds heard. Central nervous system: Alert and oriented. No focal neurological deficits. Extremities: Symmetric 5 x 5 power. Skin: No rashes, lesions or ulcers Psychiatry: Judgement and insight appear normal. Mood & affect anxious.    Data Reviewed: I have personally reviewed following labs and imaging studies  CBC: Recent Labs  Lab January 13, 2017 1749  WBC 13.2*  NEUTROABS 12.0*  HGB 11.3*  HCT 36.9  MCV 77.7*  PLT 328   Basic Metabolic Panel: Recent Labs  Lab 01/13/2017 1749  NA 135  K 4.4  CL 96*  CO2 28  GLUCOSE 196*  BUN 14  CREATININE 1.13*  CALCIUM 8.8*   Liver Function Tests: Recent Labs  Lab 01/13/2017 1749  AST 18  ALT 13*  ALKPHOS 67  BILITOT 0.5  PROT 6.4*  ALBUMIN 3.0*   CBG: Recent Labs  Lab 12/28/16 1301 12/28/16 1616 12/28/16 2041 12/29/16 0746 12/29/16 1208  GLUCAP 142* 159* 315* 163* 137*    Recent Results (from the past 240 hour(s))  Culture, blood (routine x 2) Call MD if unable to obtain prior to antibiotics being given     Status: None (Preliminary result)   Collection Time: 01-13-2017 10:55 PM  Result Value Ref Range Status   Specimen Description BLOOD RIGHT WRIST  Final   Special Requests   Final    BOTTLES DRAWN AEROBIC ONLY Blood Culture adequate volume   Culture NO GROWTH 1 DAY  Final   Report Status PENDING   Incomplete  Culture, blood (routine x 2) Call MD if unable to obtain prior to antibiotics being given     Status: None (Preliminary result)   Collection Time: 01/13/17 11:07 PM  Result Value Ref Range Status   Specimen Description BLOOD RIGHT HAND  Final   Special Requests   Final    BOTTLES DRAWN AEROBIC AND ANAEROBIC Blood Culture adequate volume   Culture NO GROWTH 1 DAY  Final   Report Status PENDING  Incomplete  Respiratory Panel by PCR     Status: None   Collection Time: 01-13-17 11:10 PM  Result Value Ref Range Status   Adenovirus NOT DETECTED NOT DETECTED Final   Coronavirus 229E NOT DETECTED NOT DETECTED Final   Coronavirus HKU1 NOT DETECTED NOT DETECTED Final   Coronavirus NL63 NOT DETECTED NOT DETECTED Final   Coronavirus OC43 NOT DETECTED NOT DETECTED Final   Metapneumovirus NOT DETECTED NOT DETECTED Final   Rhinovirus / Enterovirus NOT DETECTED NOT DETECTED Final   Influenza A NOT DETECTED NOT DETECTED Final   Influenza B NOT DETECTED NOT DETECTED Final   Parainfluenza Virus 1 NOT DETECTED NOT DETECTED Final   Parainfluenza  Virus 2 NOT DETECTED NOT DETECTED Final   Parainfluenza Virus 3 NOT DETECTED NOT DETECTED Final   Parainfluenza Virus 4 NOT DETECTED NOT DETECTED Final   Respiratory Syncytial Virus NOT DETECTED NOT DETECTED Final   Bordetella pertussis NOT DETECTED NOT DETECTED Final   Chlamydophila pneumoniae NOT DETECTED NOT DETECTED Final   Mycoplasma pneumoniae NOT DETECTED NOT DETECTED Final  MRSA PCR Screening     Status: None   Collection Time: 12/27/16  8:23 AM  Result Value Ref Range Status   MRSA by PCR NEGATIVE NEGATIVE Final    Comment:        The GeneXpert MRSA Assay (FDA approved for NASAL specimens only), is one component of a comprehensive MRSA colonization surveillance program. It is not intended to diagnose MRSA infection nor to guide or monitor treatment for MRSA infections.          Radiology Studies: Dg Chest Port 1  View  Result Date: 12/29/2016 CLINICAL DATA:  Respiratory failure. EXAM: PORTABLE CHEST 1 VIEW COMPARISON:  Yesterday. FINDINGS: The heart remains normal in size. No significant change in diffuse prominence of the interstitial markings in both lungs. Unremarkable bones. IMPRESSION: Stable marked interstitial fibrosis.  No acute abnormality. Electronically Signed   By: Beckie Salts M.D.   On: 12/29/2016 07:26   Dg Chest Port 1 View  Result Date: 12/28/2016 CLINICAL DATA:  76 year old female with respiratory failure. EXAM: PORTABLE CHEST 1 VIEW COMPARISON:  12/27/2016 FINDINGS: Cardiomediastinal silhouette is unchanged. Diffuse interstitial lung disease is again noted, similar to prior. No definite superimposed parenchymal opacities, though evaluation is markedly limited. Stable elevation of the right hemidiaphragm. No significant effusions or pneumothorax. No acute osseous changes. IMPRESSION: Unchanged examination demonstrating chronic interstitial lung disease. Electronically Signed   By: Sande Brothers M.D.   On: 12/28/2016 09:17        Scheduled Meds: . aspirin EC  81 mg Oral Daily  . azithromycin  250 mg Oral Daily  . enoxaparin (LOVENOX) injection  40 mg Subcutaneous Q24H  . famotidine  20 mg Oral QHS  . guaiFENesin  1,200 mg Oral BID  . hydrochlorothiazide  12.5 mg Oral Daily  . insulin aspart  0-5 Units Subcutaneous QHS  . insulin aspart  0-9 Units Subcutaneous TID WC  . insulin glargine  10 Units Subcutaneous Daily  . ipratropium-albuterol  3 mL Nebulization QID  . levothyroxine  75 mcg Oral QAC breakfast  . losartan  100 mg Oral Daily  . methylPREDNISolone (SOLU-MEDROL) injection  125 mg Intravenous TID   Continuous Infusions: . sodium chloride 10 mL/hr at 12/29/16 0500     LOS: 3 days     Davison Ohms, MD, FACP, FHM. [facility] Pager [phone]  If 7PM-7AM, please contact night-coverage [email or web address] Password [email or web address] 12/29/2016, 2:25 PM

## 2016-12-29 NOTE — Progress Notes (Signed)
Daily Progress Note   Patient Name: Kim [name]       Date: 12/29/2016 DOB: [birth year]  Age: 76 y.o. MRN#: [id number] Attending Physician: Elease Etienne, MD Primary Care Physician: Renaye Rakers, MD Admit Date: 12/30/2016  Reason for Consultation/Follow-up: Establishing goals of care  Subjective:  awake alert on BIPAP In mild resp distress Thirsty, and complaining of dry mouth See below:   Length of Stay: 3  Current Medications: Scheduled Meds:  . aspirin EC  81 mg Oral Daily  . azithromycin  250 mg Oral Daily  . enoxaparin (LOVENOX) injection  40 mg Subcutaneous Q24H  . famotidine  20 mg Oral QHS  . guaiFENesin  1,200 mg Oral BID  . hydrochlorothiazide  12.5 mg Oral Daily  . insulin aspart  0-5 Units Subcutaneous QHS  . insulin aspart  0-9 Units Subcutaneous TID WC  . insulin glargine  10 Units Subcutaneous Daily  . ipratropium-albuterol  3 mL Nebulization QID  . levothyroxine  75 mcg Oral QAC breakfast  . losartan  100 mg Oral Daily  . methylPREDNISolone (SOLU-MEDROL) injection  125 mg Intravenous TID    Continuous Infusions: . sodium chloride 10 mL/hr at 12/29/16 0500    PRN Meds: acetaminophen, benzonatate, guaiFENesin-dextromethorphan, hydrALAZINE, levalbuterol, LORazepam, morphine injection, ondansetron (ZOFRAN) IV, zolpidem  Physical Exam         Elderly lady On BIPAP Awake and tearful on BIPAP Complains of dry mouth and thirst S1 S2 Abdomen soft  Crackles, mild resp distress on BIPAP   Vital Signs: BP (!) 155/79   Pulse (!) 104   Temp (!) 97.2 F (36.2 C) (Axillary)   Resp (!) 47   Ht 5\' 9"  (1.753 m)   Wt 82.9 kg (182 lb 12.2 oz)   SpO2 92%   BMI 26.99 kg/m  SpO2: SpO2: 92 % O2 Device: O2 Device: Bi-PAP O2 Flow Rate: O2 Flow Rate (L/min):  15 L/min  Intake/output summary:   Intake/Output Summary (Last 24 hours) at 12/29/2016 1415 Last data filed at 12/29/2016 0427 Gross per 24 hour  Intake 214.5 ml  Output 550 ml  Net -335.5 ml   LBM: Last BM Date: 12/27/16 Baseline Weight: Weight: 83 kg (182 lb 15.7 oz) Most recent weight: Weight: 82.9 kg (182 lb 12.2 oz)       Palliative  Assessment/Data: PPS 30%   Flowsheet Rows     Most Recent Value  Intake Tab  Referral Department  Hospitalist  Unit at Time of Referral  Intermediate Care Unit  Palliative Care Primary Diagnosis  Pulmonary  Palliative Care Type  Return patient Palliative Care  Reason for referral  Non-pain Symptom, Clarify Goals of Care  Clinical Assessment  Palliative Performance Scale Score  30%  Pain Max last 24 hours  3  Pain Min Last 24 hours  2  Dyspnea Max Last 24 Hours  6  Dyspnea Min Last 24 hours  5  Nausea Max Last 24 Hours  1  Nausea Min Last 24 Hours  0  Anxiety Max Last 24 Hours  4  Anxiety Min Last 24 Hours  3  Psychosocial & Spiritual Assessment  Palliative Care Outcomes  Patient/Family meeting held?  Yes  Who was at the meeting?  patient, who is decisional, she has 2 daughters who are in the area,not present for initial family meeting during this consult.   Palliative Care Outcomes  Clarified goals of care      Patient Active Problem List   Diagnosis Date Noted  . Encounter for palliative care   . Acute on chronic respiratory failure with hypoxia (HCC) 01/18/2017  . Chronic kidney disease (CKD), stage II (mild) 01/04/2017  . Nausea vomiting and diarrhea 01/23/2017  . Type II diabetes mellitus with renal manifestations (HCC) 01/22/2017  . Goals of care, counseling/discussion   . Shortness of breath   . Palliative care by specialist   . ILD (interstitial lung disease) (HCC)   . Respiratory failure (HCC) 12/04/2016  . ANA positive 02/09/2016  . Acute bronchiolitis due to respiratory syncytial virus (RSV) 02/08/2016  . Chronic  respiratory failure with hypoxia (HCC) 02/06/2016  . Sepsis (HCC) 02/06/2016  . HCAP (healthcare-associated pneumonia) 02/06/2016  . 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 Acute respiratory failure with hypoxia (HCC) 01/25/2016  . Hypothyroidism 01/25/2016  . 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 Pure hypercholesterolemia 07/29/2012  . Type 2 diabetes mellitus with complication, without long-term current use of insulin (HCC) 01/16/2010  . HTN (hypertension) 01/16/2010  . Upper airway cough syndrome 01/12/2010    Palliative Care Assessment & Plan   Patient Profile:    Assessment:  Acute on chronic respiratory failure in setting of progressive ILD CT 10-11 with severe fibrotic ILD Recently hospitalized with ILD flare Now on BIPAP Remains on high dose steroids.  Complains of dry mouth and thirst.    Recommendations/Plan:   Discussed with patient and RN, try short BIPAP breaks to perform oral care and for the patient to take a few sips of liquids, if possible.   Patient is awake/alert on the BIPAP, she is tearful on the BIPAP, she says she knows "where all of this is going." I offered her supportive care and active listening, continue current mode of care, agree with current PO and IV Morphine regimen, may need Morphine drip if severe symptom burden continues.   Call placed, unable to reach daughter Cassandra at [phone].    Code Status:    Code Status Orders  (From admission, onward)        Start     Ordered   01/04/2017 2124  Do not attempt resuscitation (DNR)  Continuous    Question Answer Comment  In the event of cardiac or respiratory ARREST Do not call a "code blue"  In the event of cardiac or respiratory ARREST Do not perform Intubation, CPR, defibrillation or ACLS   In the event of cardiac or respiratory ARREST Use medication by any route, position, wound care, and other measures to  relive pain and suffering. May use oxygen, suction and manual treatment of airway obstruction as needed for comfort.      01/01/2017 2124    Code Status History    Date Active Date Inactive Code Status Order ID Comments User Context   12/05/2016 17:51 12/09/2016 16:45 DNR [id number]  Chilton GreathouseMannam, Praveen, MD Inpatient   12/04/2016 16:15 12/05/2016 17:51 Full Code [id number]  Ozella RocksMerrell, David J, MD ED   02/06/2016 03:27 02/09/2016 17:11 Full Code [id number]  Eduard ClosKakrakandy, Arshad N, MD ED   01/25/2016 21:06 01/28/2016 18:19 Full Code [id number]  Russella DarEllis, Allison L, NP ED   07/06/2014 15:00 07/09/2014 11:30 Full Code [id number]  Cristie HemStanbery, Mary L, PA-C Inpatient       Prognosis:   guarded   Discharge Planning:  To Be Determined  Care plan was discussed with  Patient, RN.   Thank you for allowing the Palliative Medicine Team to assist in the care of this patient.   Time In:  1300 Time Out: 1325 Total Time 25 Prolonged Time Billed  no       Greater than 50%  of this time was spent counseling and coordinating care related to the above assessment and plan.  Rosalin HawkingZeba Khianna Blazina, MD [phone]  Please contact Palliative Medicine Team phone at [phone] for questions and concerns.

## 2016-12-30 LAB — CBC
HCT: 34.2 % — ABNORMAL LOW (ref 36.0–46.0)
Hemoglobin: 10.5 g/dL — ABNORMAL LOW (ref 12.0–15.0)
MCH: 23.9 pg — AB (ref 26.0–34.0)
MCHC: 30.7 g/dL (ref 30.0–36.0)
MCV: 77.7 fL — AB (ref 78.0–100.0)
PLATELETS: 338 10*3/uL (ref 150–400)
RBC: 4.4 MIL/uL (ref 3.87–5.11)
RDW: 16.4 % — ABNORMAL HIGH (ref 11.5–15.5)
WBC: 14.3 10*3/uL — ABNORMAL HIGH (ref 4.0–10.5)

## 2016-12-30 LAB — BASIC METABOLIC PANEL
ANION GAP: 10 (ref 5–15)
ANION GAP: 10 (ref 5–15)
BUN: 28 mg/dL — AB (ref 6–20)
BUN: 34 mg/dL — ABNORMAL HIGH (ref 6–20)
CALCIUM: 8.9 mg/dL (ref 8.9–10.3)
CALCIUM: 8.9 mg/dL (ref 8.9–10.3)
CO2: 30 mmol/L (ref 22–32)
CO2: 30 mmol/L (ref 22–32)
Chloride: 104 mmol/L (ref 101–111)
Chloride: 104 mmol/L (ref 101–111)
Creatinine, Ser: 1.11 mg/dL — ABNORMAL HIGH (ref 0.44–1.00)
Creatinine, Ser: 1.27 mg/dL — ABNORMAL HIGH (ref 0.44–1.00)
GFR calc Af Amer: 55 mL/min — ABNORMAL LOW (ref >60)
GFR, EST AFRICAN AMERICAN: 47 mL/min — AB (ref >60)
GFR, EST NON AFRICAN AMERICAN: 47 mL/min — AB (ref >60)
GFR, EST NON AFRICAN AMERICAN: 40 mL/min — AB (ref >60)
GLUCOSE: 158 mg/dL — AB (ref 65–99)
GLUCOSE: 259 mg/dL — AB (ref 65–99)
POTASSIUM: 3.3 mmol/L — AB (ref 3.5–5.1)
Potassium: 3.6 mmol/L (ref 3.5–5.1)
Sodium: 144 mmol/L (ref 135–145)
Sodium: 144 mmol/L (ref 135–145)

## 2016-12-30 LAB — GLUCOSE, CAPILLARY
GLUCOSE-CAPILLARY: 145 mg/dL — AB (ref 65–99)
GLUCOSE-CAPILLARY: 128 mg/dL — AB (ref 65–99)
Glucose-Capillary: 152 mg/dL — ABNORMAL HIGH (ref 65–99)
Glucose-Capillary: 259 mg/dL — ABNORMAL HIGH (ref 65–99)

## 2016-12-30 MED ORDER — LEVOTHYROXINE SODIUM 100 MCG IV SOLR
37.5000 ug | Freq: Every day | INTRAVENOUS | Status: DC
  Administered 2016-12-30 – 2017-01-02 (×4): 37.5 ug via INTRAVENOUS
  Filled 2016-12-30 (×4): qty 5

## 2016-12-30 MED ORDER — BISACODYL 10 MG RE SUPP
10.0000 mg | Freq: Once | RECTAL | Status: AC
  Administered 2016-12-30: 10 mg via RECTAL
  Filled 2016-12-30: qty 1

## 2016-12-30 MED ORDER — MORPHINE SULFATE (PF) 4 MG/ML IV SOLN
2.0000 mg | INTRAVENOUS | Status: DC | PRN
  Administered 2016-12-30 – 2016-12-31 (×5): 2 mg via INTRAVENOUS
  Filled 2016-12-30 (×5): qty 1

## 2016-12-30 MED ORDER — LORAZEPAM 2 MG/ML IJ SOLN
1.0000 mg | Freq: Four times a day (QID) | INTRAMUSCULAR | Status: DC | PRN
  Administered 2016-12-30 – 2017-01-02 (×8): 1 mg via INTRAVENOUS
  Filled 2016-12-30 (×9): qty 1

## 2016-12-30 MED ORDER — SODIUM CHLORIDE 0.9 % IV BOLUS (SEPSIS)
250.0000 mL | Freq: Once | INTRAVENOUS | Status: AC
  Administered 2016-12-30: 250 mL via INTRAVENOUS

## 2016-12-30 MED ORDER — ACETAMINOPHEN 650 MG RE SUPP: 650.0000 mg | RECTAL | Status: DC | PRN

## 2016-12-30 MED ORDER — DEXTROSE 5 % IV SOLN
250.0000 mg | INTRAVENOUS | Status: AC
  Administered 2016-12-30 – 2016-12-31 (×2): 250 mg via INTRAVENOUS
  Filled 2016-12-30 (×3): qty 250

## 2016-12-30 MED ORDER — FAMOTIDINE IN NACL 20-0.9 MG/50ML-% IV SOLN
20.0000 mg | Freq: Every day | INTRAVENOUS | Status: DC
  Administered 2016-12-30 – 2017-01-02 (×4): 20 mg via INTRAVENOUS
  Filled 2016-12-30 (×4): qty 50

## 2016-12-30 MED ORDER — ASPIRIN 300 MG RE SUPP
150.0000 mg | Freq: Every day | RECTAL | Status: DC
  Administered 2016-12-30 – 2017-01-01 (×3): 150 mg via RECTAL
  Filled 2016-12-30 (×3): qty 1

## 2016-12-30 NOTE — Progress Notes (Signed)
RN called RT regarding pts sats ,RN placed pt back on bipap due to pt's sats dropping. Pt very anxious, once pt was placed on bipap pt sats increased to 95%.

## 2016-12-30 NOTE — Progress Notes (Signed)
PROGRESS NOTE   Kim [name]  ZOX:[id number]    DOB: [birth year]    DOA: 01-15-17  PCP: Renaye Rakers, MD   I have briefly reviewed patients previous medical [facility] in [facility].  Brief Narrative:  76 year old female, lives with family, independent of activities, PMH of severe ILD, chronic hypoxic respiratory failure >oxygen dependent on 2 L oxygen at rest and 6 L with activity & steroid dependent (Dr. Sherene Sires, pulmonology), HTN, HLD, GERD, DM, hypothyroid, stage II chronic kidney disease reports progressively worsening dyspnea over the past couple of days, chronic cough with intermittent clear sputum which is not changed, chills without fevers and no chest pains. Denies sick contacts at home. Self-limiting nausea, vomiting and diarrhea 2 days ago. In the ED hypoxic in the 60s on 2 L. Has been on BiPAP since ED arrival 11/1. Chest x-ray shows pulmonary fibrosis, BNP normal, RSV panel negative. Admitted to stepdown for acute on chronic hypoxic respiratory failure due to pulmonary fibrosis flare. Pulmonology consulted.   Assessment & Plan:   Principal Problem:   Acute on chronic respiratory failure with hypoxia (HCC) Active Problems:   HTN (hypertension)   Hypothyroidism   Sepsis (HCC)   Chronic kidney disease (CKD), stage II (mild)   Nausea vomiting and diarrhea   Type II diabetes mellitus with renal manifestations (HCC)   Encounter for palliative care   Acute on chronic hypoxic respiratory failure secondary to severe/end-stage pulmonary fibrosis flare - At baseline patient is oxygen dependent (2L at rest and 6L with activity) and steroid dependent (15 mg prednisone daily). - Low index of suspicion for infectious etiology. No fever, only mild leukocytosis, RSV panel negative, chest x-ray shows chronic interstitial lung disease without pneumonia. Blood cultures negative to date - Continue IV Solu-Medrol 125 MG 3 times a day, scheduled DuoNeb's, Xopenex when necessary, antitussives,  empiric azithromycin - D-dimer 0.62. Low index of suspicion for PE. -Unable to liberate from BiPAP since admission.  As per pulmonology follow-up, BiPAP as needed during the day and mandatory at night, Lasix 40 mg IV x1. If patient does not improve with these measures by then primary pulmonologist Dr. Sherene Sires to discuss course with patient. - No change in status today. I personally called Dr. Sherene Sires and requested that he meet with patient and discuss prognosis to guide care and possible transitioning to palliation. He will probably come by to see her on 11/6.  SIRS - Likely related to acute respiratory failure rather than true sepsis. - Influenza panel PCR and RSV panel PCR negative. Lactate normalized. PCT Neg - Continue azithromycin.  IV fluids discontinued  DM 2 - Last A1c: 6. Hold metformin. SSI.  Now uncontrolled, precipitated by steroids.  Added Lantus 10 units daily.  Monitor and adjust insulins as needed.  Better.  Essential hypertension Controlled. Currently on Hyzaar. When necessary IV hydralazine.  Hypothyroid TSH 0.779 on 02/06/16. Continue Synthroid.  Stage II chronic kidney disease: Baseline creatinine 1-1.1. Presented with creatinine of 1.13. Stable.  Self-limiting nausea, vomiting and diarrhea Seems to have resolved. Monitor.  Microcytic anemia Stable.  . DVT prophylaxis: Lovenox  Code Status: DO NOT RESUSCITATE  Family Communication: Unable to reach daughters via phone today. Left VM for one of them to call back. Disposition: Not medically stable for discharge.   Consultants:  Pulmonology Palliative care team   Procedures:  BiPAP   Antimicrobials:  Azithromycin    Subjective: Ongoing dyspnea and need for BiPAP. Nothing by mouth due to BiPAP needs. Dry mouth.  ROS: As above.  Objective:  Vitals:   12/30/16 1447 12/30/16 1454 12/30/16 1553 12/30/16 1615  BP: (!) 184/101  (!) 168/95   Pulse:   (!) 123   Resp:   (!) 39   Temp:   97.8 F (36.6 C)     TempSrc:   Oral   SpO2:  96% 97% 97%  Weight:      Height:        Examination:  General exam: Elderly female, moderately built and nourished, chronically ill looking, lying propped up in bed on BiPAP this morning. Respiratory system: markedly reduced breath sounds bilaterally with scattered extensive bilateral coarse/Velcro-like chronic crackles. No wheezing or rhonchi appreciated.  Unchanged over the last couple of days. Cardiovascular system: S1 & S2 heard, RRR. No JVD, murmurs, rubs, gallops or clicks. No pedal edema. Telemetry: Sinus tachycardia in the 100s. Gastrointestinal system: Abdomen is nondistended, soft and nontender. No organomegaly or masses felt. Normal bowel sounds heard. Central nervous system: Alert and oriented. No focal neurological deficits. Extremities: Symmetric 5 x 5 power. Skin: No rashes, lesions or ulcers Psychiatry: Judgement and insight appear normal. Mood & affect anxious.    Data Reviewed: I have personally reviewed following labs and imaging studies  CBC: Recent Labs  Lab 06/25/2016 1749 12/30/16 0545  WBC 13.2* 14.3*  NEUTROABS 12.0*  --   HGB 11.3* 10.5*  HCT 36.9 34.2*  MCV 77.7* 77.7*  PLT 328 338   Basic Metabolic Panel: Recent Labs  Lab 06/25/2016 1749 12/30/16 0545  NA 135 144  K 4.4 3.6  CL 96* 104  CO2 28 30  GLUCOSE 196* 158*  BUN 14 28*  CREATININE 1.13* 1.11*  CALCIUM 8.8* 8.9   Liver Function Tests: Recent Labs  Lab 06/25/2016 1749  AST 18  ALT 13*  ALKPHOS 67  BILITOT 0.5  PROT 6.4*  ALBUMIN 3.0*   CBG: Recent Labs  Lab 12/29/16 1549 12/29/16 2042 12/30/16 0756 12/30/16 1203 12/30/16 1557  GLUCAP 132* 149* 152* 145* 128*    Recent Results (from the past 240 hour(s))  Culture, blood (routine x 2) Call MD if unable to obtain prior to antibiotics being given     Status: None (Preliminary result)   Collection Time: 06/25/2016 10:55 PM  Result Value Ref Range Status   Specimen Description BLOOD RIGHT WRIST   Final   Special Requests   Final    BOTTLES DRAWN AEROBIC ONLY Blood Culture adequate volume   Culture NO GROWTH 3 DAYS  Final   Report Status PENDING  Incomplete  Culture, blood (routine x 2) Call MD if unable to obtain prior to antibiotics being given     Status: None (Preliminary result)   Collection Time: 06/25/2016 11:07 PM  Result Value Ref Range Status   Specimen Description BLOOD RIGHT HAND  Final   Special Requests   Final    BOTTLES DRAWN AEROBIC AND ANAEROBIC Blood Culture adequate volume   Culture NO GROWTH 3 DAYS  Final   Report Status PENDING  Incomplete  Respiratory Panel by PCR     Status: None   Collection Time: 06/25/2016 11:10 PM  Result Value Ref Range Status   Adenovirus NOT DETECTED NOT DETECTED Final   Coronavirus 229E NOT DETECTED NOT DETECTED Final   Coronavirus HKU1 NOT DETECTED NOT DETECTED Final   Coronavirus NL63 NOT DETECTED NOT DETECTED Final   Coronavirus OC43 NOT DETECTED NOT DETECTED Final   Metapneumovirus NOT DETECTED NOT DETECTED Final   Rhinovirus /  Enterovirus NOT DETECTED NOT DETECTED Final   Influenza A NOT DETECTED NOT DETECTED Final   Influenza B NOT DETECTED NOT DETECTED Final   Parainfluenza Virus 1 NOT DETECTED NOT DETECTED Final   Parainfluenza Virus 2 NOT DETECTED NOT DETECTED Final   Parainfluenza Virus 3 NOT DETECTED NOT DETECTED Final   Parainfluenza Virus 4 NOT DETECTED NOT DETECTED Final   Respiratory Syncytial Virus NOT DETECTED NOT DETECTED Final   Bordetella pertussis NOT DETECTED NOT DETECTED Final   Chlamydophila pneumoniae NOT DETECTED NOT DETECTED Final   Mycoplasma pneumoniae NOT DETECTED NOT DETECTED Final  MRSA PCR Screening     Status: None   Collection Time: 12/27/16  8:23 AM  Result Value Ref Range Status   MRSA by PCR NEGATIVE NEGATIVE Final    Comment:        The GeneXpert MRSA Assay (FDA approved for NASAL specimens only), is one component of a comprehensive MRSA colonization surveillance program. It is  not intended to diagnose MRSA infection nor to guide or monitor treatment for MRSA infections.          Radiology Studies: Dg Chest Port 1 View  Result Date: 12/29/2016 CLINICAL DATA:  Respiratory failure. EXAM: PORTABLE CHEST 1 VIEW COMPARISON:  Yesterday. FINDINGS: The heart remains normal in size. No significant change in diffuse prominence of the interstitial markings in both lungs. Unremarkable bones. IMPRESSION: Stable marked interstitial fibrosis.  No acute abnormality. Electronically Signed   By: Beckie Salts M.D.   On: 12/29/2016 07:26        Scheduled Meds: . aspirin  150 mg Rectal Daily  . enoxaparin (LOVENOX) injection  40 mg Subcutaneous Q24H  . insulin aspart  0-5 Units Subcutaneous QHS  . insulin aspart  0-9 Units Subcutaneous TID WC  . insulin glargine  10 Units Subcutaneous Daily  . ipratropium-albuterol  3 mL Nebulization QID  . levothyroxine  37.5 mcg Intravenous Daily  . methylPREDNISolone (SOLU-MEDROL) injection  125 mg Intravenous TID   Continuous Infusions: . sodium chloride 10 mL/hr at 12/29/16 0500  . azithromycin 250 mg (12/30/16 1643)  . famotidine (PEPCID) IV       LOS: 4 days     Libbey Duce, MD, FACP, FHM. [facility] Pager [phone]  If 7PM-7AM, please contact night-coverage [email or web address] Password [email or web address] 12/30/2016, 5:42 PM

## 2016-12-30 NOTE — Progress Notes (Signed)
Name: Kim [name] MRN: [id number] DOB: 04/15/1940    ADMISSION DATE:  Jan 17, 2017 CONSULTATION DATE:  12/27/2016  REFERRING MD :  Waymon Amato  CHIEF COMPLAINT:  Acute on chronic respiratory failure in setting of progressive ILD.   BRIEF PATIENT DESCRIPTION:  Elderly chronically ill appearing female supine in bed on BIPAP, RR 39, Saturations 99% on 40%, IPAP of 10, EPAP of 5, Rate of 8.   SIGNIFICANT EVENTS  Recent Admission 12/04/2016-12/09/2016 for ILD Flare 01/17/17>> Admission  STUDIES:  CXR 17-Jan-2017>>Stable exam. 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  HRCT 12/05/2016>>  Progressive basilar predominant severe fibrotic interstitial lung disease with extensive honeycombing, diagnostic of usual interstitial pneumonia (UIP). Stable trace dependent bilateral pleural effusions/mild pleural thickening. Three-vessel coronary atherosclerosis. Stable mild reactive mediastinal lymphadenopathy.  Cultures:  17-Jan-2017>> RVP>> Negative Jan 17, 2017>> Blood>>  HISTORY OF PRESENT ILLNESS:  SHARLENA [name] is a 76 y.o. female former smoker with medical history significant of ILD from remote lung injury, HTN, HLD, GERD, DM. She is followed in the Pulmonary Office by Dr. Sherene Sires. She is prednisone dependent  On 15 mg daily, she is oxygen dependent (2L at rest, 6L with exertion). Patient presented from her primary care physician's office and acute respiratory failure on 01-17-17.Marland Kitchen Patient arrived  by private vehicle after developing rapid onset shortness of breath with noted hypoxia despite supplemental oxygen administration. Patient reports several day history of worsening cough and shortness of breath. Cough is nonproductive and associated with chills but denies fevers, lower extremity swelling, chest pain, orthopnea. Time of arrival in the emergency room triage area patient was noted to be hypoxic with O2 saturations in the mid  60s despite being on her home O2 of 2 L. Patient required nebulizer treatments and steroids with oxygen administration to 6 L in order to get O2 saturations above 90%. WBC 13.2, negative troponin, lipase 23, d-dimer 0.62, stable renal function, temperature normal. No chest pain or signs of DVT. She endorsed nausea with vomiting and diarrhea  10/31. She was admitted to SDU, BiPAP was started, Scheduled BD's, IV steroids and ABX therapy was initiated. PCCM have been asked to consult for assistance with pulmonary/ BiPAP  management due to increased oxygen demands. Patient had a recent admission ILD flare 12/04/2016-12/09/2016.   SUBJECTIVE: Only tolerated being off BiPAP for ~30 minutes, got anxious and hypoxic.  Resolved after being placed back on BiPAP and getting 1 dose morphine.  VITAL SIGNS: Temp:  [96.9 F (36.1 C)-98 F (36.7 C)] 97.2 F (36.2 C) (11/05 0755) Pulse Rate:  [101-127] 110 (11/05 0755) Resp:  [29-47] 41 (11/05 0755) BP: (141-172)/(79-150) 157/92 (11/05 0755) SpO2:  [91 %-98 %] 93 % (11/05 0755) FiO2 (%):  [50 %-60 %] 50 % (11/05 0722)  PHYSICAL EXAMINATION: General:  Elderly female, resting in bed on BiPAP.  No distress Neuro:  Awake and alert, Anxious.  No deficits HEENT: Normocephalic. Atraumatic, BIPAP in place Cardiovascular: S1, S2, RRR No RMG, rare ST  per telemetry Lungs: Crackles throughout, diminished per bases Abdomen:  Soft, Flat, Non-distended, Non-tender, BS diminished Musculoskeletal: No obvious deformities, no joint redness or warmth Skin:  Warm Dry and intact, no rash or lesions noted  Recent Labs  Lab 01/17/2017 1749 12/30/16 0545  NA 135 144  K 4.4 3.6  CL 96* 104  CO2 28 30  BUN 14 28*  CREATININE 1.13* 1.11*  GLUCOSE 196* 158*   Recent Labs  Lab 01/17/17 1749 12/30/16 0545  HGB 11.3* 10.5*  HCT 36.9 34.2*  WBC 13.2* 14.3*  PLT 328 338   Dg Chest Port 1 View  Result Date: 12/29/2016 CLINICAL DATA:  Respiratory failure. EXAM: PORTABLE  CHEST 1 VIEW COMPARISON:  Yesterday. FINDINGS: The heart remains normal in size. No significant change in diffuse prominence of the interstitial markings in both lungs. Unremarkable bones. IMPRESSION: Stable marked interstitial fibrosis.  No acute abnormality. Electronically Signed   By: Beckie SaltsSteven  Reid M.D.   On: 12/29/2016 07:26    ASSESSMENT / PLAN:  Acute on Chronic Hypoxic Respiratory Failure: 2/2 Progressive End Stage Pulmonary Fibrosis Flare - Has been unable to wean from BiPAP due to hypoxia, anxiety, increased WOB. 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 Plan: - BiPAP continuous for now, can attempt to have breaks if she will tolerate them (only did so for ~30 minutes) - NPO - Titrate oxygen for Saturation Goals > 88% - Continue high dose steroids, BD's - Cough suppression prn ( Uses Benzonatate at home) - Prophylactic ABX per primary as she is chronically immunosupressed - Follow Micro - DNR per her request - Continue low dose morphine, ativan as needed for comfort - Palliative care following, might need to transition to hospice / palliation if continues to have trouble weaning off BiPAP   Discussion: Acute flare of  Adelei's progressive ILD. She was most recently hospitalized 10/10-10/15/2018. Only 2 weeks between hospitalizations.  Agree with BiPAP and IV solumedrol, ABX per primary. If she does not improve over the weekend on high dose IV steroids, palliation/ hospice should be considered.She was seen by palliation 12/06/2016 during her last admission. At that time she  was not deemed a hospice candidate due to her response to steroids . She understands her lung disease is progressing, irreversible, and symptom management is getting more futile. Consideration should be given to optimization of pulmonary status as an inpatient and then discharge home with Palliation or  Hospice Care/ Support. Pt. Has not responded to steroids this admission which confirms end stage status of fibrosis, and supports Palliation/ Hospice as appropriate goal of care.    Rest per primary team.   Rutherford Guysahul Andree Heeg, PA - [facility] Pager: [phone]  or [phone] 12/30/2016, 9:04 AM

## 2016-12-30 NOTE — Progress Notes (Signed)
Met patient and doctor came in and shared condition of patient with me to help me understand.  Difficult for her to talk due to oxygen mask and trouble breathing.  Patient very appreciative of visit. Talked with her listened to what she was able to share.  Invited her to fins something to focus on when she feels afraid or gets worried.  She said her children and grandchildren-picture could help her, She spoke of church. I asked if she had favorite songs, yes oh How I Love Jesus.  I sang and she sang with me despite the trouble with breathing - She seemed to feel better. She said scripture would help to so I got a Bible for her and will try to come back and read some for her.  (Got called to a code blue) Conard Novak, Chaplain   12/30/16 1000  Clinical Encounter Type  Visited With Patient;Health care provider  Visit Type Initial;Spiritual support  Referral From Physician  Spiritual Encounters  Spiritual Needs [facility] text;Literature;Prayer;Emotional  Stress Factors  Patient Stress Factors Health changes  Family Stress Factors None identified

## 2016-12-30 NOTE — Progress Notes (Signed)
Daily Progress Note   Patient Name: Kim [name]       Date: 12/30/2016 DOB: 09-26-1940  Age: 76 y.o. MRN#: [id number] Attending Physician: Elease Etienne, MD Primary Care Physician: Renaye Rakers, MD Admit Date: 2017/01/06  Reason for Consultation/Follow-up: Establishing goals of care  Subjective:  awake alert on BIPAP In mild resp distress Thirsty, and complaining of dry mouth Chaplain at bedside  See below:   Length of Stay: 4  Current Medications: Scheduled Meds:  . aspirin  150 mg Rectal Daily  . enoxaparin (LOVENOX) injection  40 mg Subcutaneous Q24H  . insulin aspart  0-5 Units Subcutaneous QHS  . insulin aspart  0-9 Units Subcutaneous TID WC  . insulin glargine  10 Units Subcutaneous Daily  . ipratropium-albuterol  3 mL Nebulization QID  . levothyroxine  37.5 mcg Intravenous Daily  . methylPREDNISolone (SOLU-MEDROL) injection  125 mg Intravenous TID    Continuous Infusions: . sodium chloride 10 mL/hr at 12/29/16 0500  . azithromycin    . famotidine (PEPCID) IV      PRN Meds: acetaminophen, hydrALAZINE, levalbuterol, LORazepam, morphine injection, ondansetron (ZOFRAN) IV  Physical Exam         Elderly lady On BIPAP Awake and tearful on BIPAP Complains of dry mouth and thirst S1 S2 Abdomen soft  Crackles, mild resp distress on BIPAP   Vital Signs: BP (!) 157/92   Pulse (!) 110   Temp (!) 97.2 F (36.2 C) (Axillary)   Resp (!) 41   Ht  (1.753 m)   Wt 82.9 kg (182 lb 12.2 oz)   SpO2 93%   BMI 26.99 kg/m  SpO2: SpO2: 93 % O2 Device: O2 Device: Bi-PAP O2 Flow Rate: O2 Flow Rate (L/min): 15 L/min  Intake/output summary:   Intake/Output Summary (Last 24 hours) at 12/30/2016 1123 Last data filed at 12/30/2016 5621 Gross per 24 hour  Intake  235.5 ml  Output 1000 ml  Net -764.5 ml   LBM: Last BM Date: 12/27/16 Baseline Weight: Weight: 83 kg (182 lb 15.7 oz) Most recent weight: Weight: 82.9 kg (182 lb 12.2 oz)       Palliative Assessment/Data: PPS 30%   Flowsheet Rows     Most Recent Value  Intake Tab  Referral Department  Hospitalist  Unit at Time of Referral  Intermediate Care Unit  Palliative Care Primary Diagnosis  Pulmonary  Palliative Care Type  Return patient Palliative Care  Reason for referral  Non-pain Symptom, Clarify Goals of Care  Clinical Assessment  Palliative Performance Scale Score  30%  Pain Max last 24 hours  3  Pain Min Last 24 hours  2  Dyspnea Max Last 24 Hours  6  Dyspnea Min Last 24 hours  5  Nausea Max Last 24 Hours  1  Nausea Min Last 24 Hours  0  Anxiety Max Last 24 Hours  4  Anxiety Min Last 24 Hours  3  Psychosocial & Spiritual Assessment  Palliative Care Outcomes  Patient/Family meeting held?  Yes  Who was at the meeting?  patient, who is decisional, she has 2 daughters who are in the area,not present for initial family meeting during this consult.   Palliative Care Outcomes  Clarified goals of care      Patient Active Problem List   Diagnosis Date Noted  . Encounter for palliative care   . Acute on chronic respiratory failure with hypoxia (HCC) 01-08-17  . Chronic kidney disease (CKD), stage II (mild) 01-08-2017  . Nausea vomiting and diarrhea 2017/01/08  . Type II diabetes mellitus with renal manifestations (HCC) 2017-01-08  . Goals of care, counseling/discussion   . Shortness of breath   . Palliative care by specialist   . ILD (interstitial lung disease) (HCC)   . Respiratory failure (HCC) 12/04/2016  . ANA positive 02/09/2016  . Acute bronchiolitis due to respiratory syncytial virus (RSV) 02/08/2016  . Chronic respiratory failure with hypoxia (HCC) 02/06/2016  . Sepsis (HCC) 02/06/2016  . HCAP (healthcare-associated pneumonia) 02/06/2016  . 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 Acute respiratory failure with hypoxia (HCC) 01/25/2016  . Hypothyroidism 01/25/2016  . 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 Pure hypercholesterolemia 07/29/2012  . Type 2 diabetes mellitus with complication, without long-term current use of insulin (HCC) 01/16/2010  . HTN (hypertension) 01/16/2010  . Upper airway cough syndrome 01/12/2010    Palliative Care Assessment & Plan   Patient Profile:    Assessment:  Acute on chronic respiratory failure in setting of progressive ILD CT 10-11 with severe fibrotic ILD Recently hospitalized with ILD flare Now on BIPAP Remains on high dose steroids.  Complains of dry mouth and thirst.    Recommendations/Plan:   Discussed with patient, her biggest distress is dry mouth and thirst, recommend short BIPAP breaks to perform oral care and for the patient to take a few sips of liquids, if possible.   Patient is awake/alert on the BIPAP, she is tearful, some what anxious today, appreciate chaplain's visit and prayer, patient spoke of her strong faith, we reviewed her serious life limiting illness, she stated, "I know you'll are doing everything you can for me"   I offered her supportive care and active listening, continue current mode of care, agree with current PO and IV Morphine regimen,agree with prn IV Ativan, also remains on high dose steroids, PCCM following,may need Morphine drip if severe symptom burden continues.   Call placed, unable to reach daughter Cassandra at [phone]. Call placed, unable to reach daughter Ditina [phone].    Code Status:    Code Status Orders  (From admission, onward)        Start     Ordered   08-Jan-2017 2124  Do not attempt resuscitation (DNR)  Continuous  Question Answer Comment  In the event of cardiac or respiratory ARREST Do not call a "code blue"   In the event of cardiac or respiratory ARREST Do  not perform Intubation, CPR, defibrillation or ACLS   In the event of cardiac or respiratory ARREST Use medication by any route, position, wound care, and other measures to relive pain and suffering. May use oxygen, suction and manual treatment of airway obstruction as needed for comfort.      12/31/2016 2124    Code Status History    Date Active Date Inactive Code Status Order ID Comments User Context   12/05/2016 17:51 12/09/2016 16:45 DNR [id number]  Chilton Greathouse, MD Inpatient   12/04/2016 16:15 12/05/2016 17:51 Full Code [id number]  Ozella Rocks, MD ED   02/06/2016 03:27 02/09/2016 17:11 Full Code [id number]  Eduard Clos, MD ED   01/25/2016 21:06 01/28/2016 18:19 Full Code [id number]  Russella Dar, NP ED   07/06/2014 15:00 07/09/2014 11:30 Full Code [id number]  Cristie Hem, PA-C Inpatient       Prognosis:   guarded   Discharge Planning:  To Be Determined: patient may likely need residential hospice, if ongoing decline continues.   Care plan was discussed with  Patient, chaplain.   Thank you for allowing the Palliative Medicine Team to assist in the care of this patient.   Time In: 9.30 Time Out: 10.05 Total Time 35 Prolonged Time Billed  no       Greater than 50%  of this time was spent counseling and coordinating care related to the above assessment and plan.  Rosalin Hawking, MD [phone]  Please contact Palliative Medicine Team phone at [phone] for questions and concerns.

## 2016-12-30 NOTE — Progress Notes (Signed)
Patient only urinated 200cc over night. Obvious bladder distention can palpated on the patient's lower abdomen. Patient says she has the urge to urinate, but is not able to. The RN assisted the patient on the bedpan multiple times with no success of urination. Blount, NP notified. In-and-Out cath ordered and performed. 700cc was drained from the bladder.

## 2016-12-30 NOTE — Care Management Important Message (Signed)
Important Message  Patient Details  Name: Kim [name] MRN: [id number] Date of Birth: [birth year]   Medicare Important Message Given:  Yes    Kyla BalzarineShealy, Ahad Colarusso Abena 12/30/2016, 10:24 AM

## 2016-12-31 DIAGNOSIS — J9601 Acute respiratory failure with hypoxia: Secondary | ICD-10-CM

## 2016-12-31 LAB — GLUCOSE, CAPILLARY
GLUCOSE-CAPILLARY: 163 mg/dL — AB (ref 65–99)
GLUCOSE-CAPILLARY: 238 mg/dL — AB (ref 65–99)
GLUCOSE-CAPILLARY: 233 mg/dL — AB (ref 65–99)
Glucose-Capillary: 192 mg/dL — ABNORMAL HIGH (ref 65–99)

## 2016-12-31 MED ORDER — HYDRALAZINE HCL 20 MG/ML IJ SOLN
10.0000 mg | Freq: Three times a day (TID) | INTRAMUSCULAR | Status: DC | PRN
Start: 2016-12-31 — End: 2017-01-03
  Administered 2017-01-01 (×2): 10 mg via INTRAVENOUS
  Filled 2016-12-31 (×2): qty 1

## 2016-12-31 MED ORDER — MORPHINE SULFATE (PF) 4 MG/ML IV SOLN
INTRAVENOUS | Status: AC
  Administered 2016-12-31: 2 mg via INTRAVENOUS
  Filled 2016-12-31: qty 1

## 2016-12-31 MED ORDER — POTASSIUM CHLORIDE 10 MEQ/100ML IV SOLN
10.0000 meq | Freq: Once | INTRAVENOUS | Status: AC
  Administered 2016-12-31: 10 meq via INTRAVENOUS
  Filled 2016-12-31: qty 100

## 2016-12-31 MED ORDER — MORPHINE SULFATE (PF) 4 MG/ML IV SOLN
2.0000 mg | INTRAVENOUS | Status: DC | PRN
  Administered 2016-12-31 – 2017-01-02 (×7): 2 mg via INTRAVENOUS
  Filled 2016-12-31 (×6): qty 1

## 2016-12-31 MED ORDER — FUROSEMIDE 10 MG/ML IJ SOLN
40.0000 mg | Freq: Once | INTRAMUSCULAR | Status: AC
  Administered 2016-12-31: 40 mg via INTRAVENOUS
  Filled 2016-12-31: qty 4

## 2016-12-31 MED ORDER — POTASSIUM CHLORIDE CRYS ER 20 MEQ PO TBCR: 20.0000 meq | EXTENDED_RELEASE_TABLET | Freq: Once | ORAL | Status: DC

## 2016-12-31 MED ORDER — DEXTROSE 5 % IV SOLN
500.0000 mg | INTRAVENOUS | Status: DC
  Administered 2017-01-01 – 2017-01-03 (×3): 500 mg via INTRAVENOUS
  Filled 2016-12-31 (×3): qty 500

## 2016-12-31 MED ORDER — POTASSIUM CHLORIDE CRYS ER 20 MEQ PO TBCR
40.0000 meq | EXTENDED_RELEASE_TABLET | Freq: Once | ORAL | Status: AC
  Administered 2016-12-31: 40 meq via ORAL
  Filled 2016-12-31: qty 2

## 2016-12-31 NOTE — Progress Notes (Signed)
Name: Kim [name] MRN: [id number] DOB: Apr 19, 1940    ADMISSION DATE:  01/22/2017 CONSULTATION DATE:  12/27/2016  REFERRING MD :  Waymon AmatoHongalgi  CHIEF COMPLAINT:  Acute on chronic respiratory failure in setting of progressive ILD.   BRIEF PATIENT DESCRIPTION:  Elderly chronically ill appearing female supine in bed on BIPAP, RR 39, Saturations 99% on 40%, IPAP of 10, EPAP of 5, Rate of 8.   SIGNIFICANT EVENTS  Recent Admission 12/04/2016-12/09/2016 for ILD Flare 12/30/2016>> Admission  STUDIES:  CXR 01/23/2017>>Stable exam. 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  HRCT 12/05/2016>>  Progressive basilar predominant severe fibrotic interstitial lung disease with extensive honeycombing, diagnostic of usual interstitial pneumonia (UIP). Stable trace dependent bilateral pleural effusions/mild pleural thickening. Three-vessel coronary atherosclerosis. Stable mild reactive mediastinal lymphadenopathy.  Cultures:  01/17/2017>> RVP>> Negative 12/27/2016>> Blood>>  HISTORY OF PRESENT ILLNESS:  Kim [name] is a 76 y.o. female former smoker with medical history significant of ILD from remote lung injury, HTN, HLD, GERD, DM. She is followed in the Pulmonary Office by Dr. Sherene SiresWert. She is prednisone dependent  On 15 mg daily, she is oxygen dependent (2L at rest, 6L with exertion). Patient presented from her primary care physician's office and acute respiratory failure on 01/18/2017.Marland Kitchen. Patient arrived  by private vehicle after developing rapid onset shortness of breath with noted hypoxia despite supplemental oxygen administration. Patient reports several day history of worsening cough and shortness of breath. Cough is nonproductive and associated with chills but denies fevers, lower extremity swelling, chest pain, orthopnea. Time of arrival in the emergency room triage area patient was noted to be hypoxic with O2 saturations in the mid  60s despite being on her home O2 of 2 L. Patient required nebulizer treatments and steroids with oxygen administration to 6 L in order to get O2 saturations above 90%. WBC 13.2, negative troponin, lipase 23, d-dimer 0.62, stable renal function, temperature normal. No chest pain or signs of DVT. She endorsed nausea with vomiting and diarrhea  10/31. She was admitted to SDU, BiPAP was started, Scheduled BD's, IV steroids and ABX therapy was initiated. PCCM have been asked to consult for assistance with pulmonary/ BiPAP  management due to increased oxygen demands. Patient had a recent admission ILD flare 12/04/2016-12/09/2016.   SUBJECTIVE: Has been off BiPAP and on NRB this morning, tolerating well and feels comfortable.  Seems to be gradually responding to steroids. Discussed with Dr. Sherene SiresWert this morning who tells me that she has always responded well to steroids; therefore, unless this is atypical from her usual presentation, would expect same this time.  Dr. Sherene SiresWert will pay a social visit this afternoon. Net +1.5L this AM.   VITAL SIGNS: Temp:  [96.8 F (36 C)-97.8 F (36.6 C)] 96.8 F (36 C) (11/06 0723) Pulse Rate:  [98-128] 102 (11/06 0723) Resp:  [21-48] 36 (11/06 0723) BP: (134-184)/(72-119) 181/107 (11/06 0741) SpO2:  [87 %-100 %] 99 % (11/06 0723) FiO2 (%):  [50 %-100 %] 100 % (11/05 2000)  PHYSICAL EXAMINATION: General:  Elderly female, resting in bed, in NAD. Neuro:  Awake and alert,  No deficits HEENT: Normocephalic. Atraumatic, NRB in place. Cardiovascular: S1, S2, RRR No RMG. Lungs: Crackles throughout R > L. Abdomen:  Soft, Flat, Non-distended, Non-tender, BS diminished. Musculoskeletal: No obvious deformities, no joint redness or warmth. Skin:  Warm Dry and intact, no rash or lesions noted.  Recent Labs  Lab 01/19/2017 1749 12/30/16 0545 12/30/16 2021  NA 135  144 144  K 4.4 3.6 3.3*  CL 96* 104 104  CO2 28 30 30   BUN 14 28* 34*  CREATININE 1.13* 1.11* 1.27*  GLUCOSE  196* 158* 259*   Recent Labs  Lab 01/05/2017 1749 12/30/16 0545  HGB 11.3* 10.5*  HCT 36.9 34.2*  WBC 13.2* 14.3*  PLT 328 338   No results found.  ASSESSMENT / PLAN:  Acute on Chronic Hypoxic Respiratory Failure: 2/2 Progressive End Stage Pulmonary Fibrosis Flare - Has had trouble weaning from BiPAP due to hypoxia, anxiety, increased WOB. 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 - Would continue BiPAP mandatory at night. - NPO. - Titrate oxygen for Saturation Goals > 88%. - Continue high dose steroids through 11/7 (3 days) then start to taper down. - Continue BD's. - Cough suppression prn ( Uses Benzonatate at home). - Prophylactic ABX per primary as she is chronically immunosupressed. - Additional lasix today, goal neg balance. - Follow Micro. - DNR per her request. - Continue low dose morphine, ativan as needed for comfort. Do not feel that she is at the point where she needs morphine infusion, etc. - Palliative care following, might need to transition to hospice / palliation if continues to have trouble weaning off BiPAP.  Hopefully can avoid as hope is she will respond to the high dose steroids (has responded well in the past).   Discussion: Acute flare of  Pedro's progressive ILD. She was most recently hospitalized 10/10-10/15/2018. Only 2 weeks between hospitalizations.  Agree with BiPAP and IV solumedrol, ABX per primary. If she does not improve over the weekend on high dose IV steroids, palliation/ hospice should be considered.She was seen by palliation 12/06/2016 during her last admission. At that time she  was not deemed a hospice candidate due to her response to steroids . She understands her lung disease is progressing, irreversible, and symptom  management is getting more futile. Consideration should be given to optimization of pulmonary status as an inpatient and then discharge home with Palliation or Hospice Care/ Support. Pt.had not responded to steroids this admission to begin with, which confirms end stage status of fibrosis, and supports Palliation/ Hospice as appropriate goal of care.  That said, over past day or two, she appears to have gradual progression / response with steroids.  Will attempt additional diuresis today and aim for negative balance overall.   Rest per primary team.   Rutherford Guysahul Desai, PA - [facility] Pager: [phone]  or [phone] 12/31/2016, 8:01 AM

## 2016-12-31 NOTE — Progress Notes (Signed)
PROGRESS NOTE    Kim [name]  ZOX:[id number] DOB: 09-Feb-1941 DOA: 01/15/2017 PCP: Renaye Rakers, MD   Brief Narrative: 76 year old female, lives with family, independent of activities, PMH of severe ILD, chronic hypoxic respiratory failure >oxygen dependent on 2 L oxygen at rest and 6 L with activity & steroid dependent (Dr. Sherene Sires, pulmonology), HTN, HLD, GERD, DM, hypothyroid, stage II chronic kidney disease reports progressively worsening dyspnea over the past couple of days, chronic cough with intermittent clear sputum which is not changed, chills without fevers and no chest pains. Denies sick contacts at home. Self-limiting nausea, vomiting and diarrhea 2 days ago. In the ED hypoxic in the 60s on 2 L. Has been on BiPAP since ED arrival 11/1. Chest x-ray shows pulmonary fibrosis, BNP normal, RSV panel negative. Admitted to stepdown for acute on chronic hypoxic respiratory failure due to pulmonary fibrosis flare. Pulmonology consulted.       Assessment & Plan:   Principal Problem:   Acute on chronic respiratory failure with hypoxia (HCC) Active Problems:   HTN (hypertension)   Hypothyroidism   Sepsis (HCC)   Chronic kidney disease (CKD), stage II (mild)   Nausea vomiting and diarrhea   Type II diabetes mellitus with renal manifestations (HCC)   Encounter for palliative care   Acute on chronic hypoxic respiratory failure secondary to severe/end-stage pulmonary fibrosis flare - At baseline patient is oxygen dependent (2L at rest and 6L with activity) and steroid dependent (15 mg prednisone daily). - Low index of suspicion for infectious etiology. No fever, only mild leukocytosis, RSV panel negative, chest x-ray shows chronic interstitial lung disease without pneumonia. Blood cultures negative to date - Continue IV Solu-Medrol 125 MG 3 times a day, scheduled DuoNeb's, Xopenex when necessary, antitussives, empiric azithromycin - D-dimer 0.62. Low index of suspicion for  PE. -continue with BiPAP as needed during the day and mandatory at night.  --received one time dose lasix.  -palliative care following.   SIRS - Likely related to acute respiratory failure rather than true sepsis. - Influenza panel PCR and RSV panel PCR negative. Lactate normalized. PCT Neg - Continue azithromycin.    DM 2 - Last A1c: 6. Hold metformin. SSI.  Now uncontrolled, precipitated by steroids.  -on lantus.   Essential hypertension Start Hydralazine PRN  Hypothyroid TSH 0.779 on 02/06/16. Continue Synthroid.  Stage II chronic kidney disease: Baseline creatinine 1-1.1. Presented with creatinine of 1.13. Stable.  Self-limiting nausea, vomiting and diarrhea Seems to have resolved. Monitor.  Microcytic anemia Repeat labs in am.        DVT prophylaxis: Lovenox.  Code Status: DNR  Family Communication: daughter at bedside.  Disposition Plan: to be determine    Consultants:   CCM  Palliative care.    Procedures:  BIPAP   Antimicrobials: azithromycin.    Subjective: Still having SOB, not better. She is not able to tolerates BIPAP, but is willing to try with low dose of morphine.   Objective: Vitals:   12/31/16 1349 12/31/16 1521 12/31/16 1522 12/31/16 1701  BP: (!) 164/105  139/82 (!) 146/79  Pulse: (!) 130  (!) 114 (!) 131  Resp: (!) 48  (!) 35 (!) 48  Temp:    97.8 F (36.6 C)  TempSrc:    Axillary  SpO2: 95% 98% 98% 94%  Weight:      Height:        Intake/Output Summary (Last 24 hours) at 12/31/2016 1745 Last data filed at 12/31/2016 1702 Gross per 24 hour  Intake 695 ml  Output 1400 ml  Net -705 ml   Filed Weights   12/27/16 0827 12/27/16 1959 12/29/16 0450  Weight: 83 kg (182 lb 15.7 oz) 89 kg (196 lb 3.4 oz) 82.9 kg (182 lb 12.2 oz)    Examination:  General exam: Appears calm and comfortable  Respiratory system: bilateral air movement,  Tachypnea,  Cardiovascular system: S1 & S2 heard, RRR. No JVD, murmurs, rubs,  gallops or clicks. No pedal edema. Gastrointestinal system: Abdomen is nondistended, soft and nontender. No organomegaly or masses felt. Normal bowel sounds heard. Central nervous system: Alert and oriented. No focal neurological deficits. Extremities: Symmetric 5 x 5 power. Skin: No rashes, lesions or ulcers     Data Reviewed: I have personally reviewed following labs and imaging studies  CBC: Recent Labs  Lab 01/01/2017 1749 12/30/16 0545  WBC 13.2* 14.3*  NEUTROABS 12.0*  --   HGB 11.3* 10.5*  HCT 36.9 34.2*  MCV 77.7* 77.7*  PLT 328 338   Basic Metabolic Panel: Recent Labs  Lab 01/05/2017 1749 12/30/16 0545 12/30/16 2021  NA 135 144 144  K 4.4 3.6 3.3*  CL 96* 104 104  CO2 28 30 30   GLUCOSE 196* 158* 259*  BUN 14 28* 34*  CREATININE 1.13* 1.11* 1.27*  CALCIUM 8.8* 8.9 8.9   GFR: Estimated Creatinine Clearance: 44 mL/min (A) (by C-G formula based on SCr of 1.27 mg/dL (H)). Liver Function Tests: Recent Labs  Lab 12/27/2016 1749  AST 18  ALT 13*  ALKPHOS 67  BILITOT 0.5  PROT 6.4*  ALBUMIN 3.0*   Recent Labs  Lab 01/22/2017 1749  LIPASE 23   No results for input(s): AMMONIA in the last 168 hours. Coagulation Profile: No results for input(s): INR, PROTIME in the last 168 hours. Cardiac Enzymes: No results for input(s): CKTOTAL, CKMB, CKMBINDEX, TROPONINI in the last 168 hours. BNP (last 3 results) Recent Labs    03/01/16 1537  PROBNP 31.0   HbA1C: No results for input(s): HGBA1C in the last 72 hours. CBG: Recent Labs  Lab 12/30/16 1557 12/30/16 2211 12/31/16 0809 12/31/16 1233 12/31/16 1659  GLUCAP 128* 259* 163* 192* 238*   Lipid Profile: No results for input(s): CHOL, HDL, LDLCALC, TRIG, CHOLHDL, LDLDIRECT in the last 72 hours. Thyroid Function Tests: No results for input(s): TSH, T4TOTAL, FREET4, T3FREE, THYROIDAB in the last 72 hours. Anemia Panel: No results for input(s): VITAMINB12, FOLATE, FERRITIN, TIBC, IRON, RETICCTPCT in the last  72 hours. Sepsis Labs: Recent Labs  Lab 01/01/2017 2243 12/27/16 0329  PROCALCITON <0.10  --   LATICACIDVEN 3.8* 1.5    Recent Results (from the past 240 hour(s))  Culture, blood (routine x 2) Call MD if unable to obtain prior to antibiotics being given     Status: None (Preliminary result)   Collection Time: 01/09/2017 10:55 PM  Result Value Ref Range Status   Specimen Description BLOOD RIGHT WRIST  Final   Special Requests   Final    BOTTLES DRAWN AEROBIC ONLY Blood Culture adequate volume   Culture NO GROWTH 4 DAYS  Final   Report Status PENDING  Incomplete  Culture, blood (routine x 2) Call MD if unable to obtain prior to antibiotics being given     Status: None (Preliminary result)   Collection Time: 01/20/2017 11:07 PM  Result Value Ref Range Status   Specimen Description BLOOD RIGHT HAND  Final   Special Requests   Final    BOTTLES DRAWN AEROBIC AND ANAEROBIC Blood  Culture adequate volume   Culture NO GROWTH 4 DAYS  Final   Report Status PENDING  Incomplete  Respiratory Panel by PCR     Status: None   Collection Time: 10/27/2016 11:10 PM  Result Value Ref Range Status   Adenovirus NOT DETECTED NOT DETECTED Final   Coronavirus 229E NOT DETECTED NOT DETECTED Final   Coronavirus HKU1 NOT DETECTED NOT DETECTED Final   Coronavirus NL63 NOT DETECTED NOT DETECTED Final   Coronavirus OC43 NOT DETECTED NOT DETECTED Final   Metapneumovirus NOT DETECTED NOT DETECTED Final   Rhinovirus / Enterovirus NOT DETECTED NOT DETECTED Final   Influenza A NOT DETECTED NOT DETECTED Final   Influenza B NOT DETECTED NOT DETECTED Final   Parainfluenza Virus 1 NOT DETECTED NOT DETECTED Final   Parainfluenza Virus 2 NOT DETECTED NOT DETECTED Final   Parainfluenza Virus 3 NOT DETECTED NOT DETECTED Final   Parainfluenza Virus 4 NOT DETECTED NOT DETECTED Final   Respiratory Syncytial Virus NOT DETECTED NOT DETECTED Final   Bordetella pertussis NOT DETECTED NOT DETECTED Final   Chlamydophila pneumoniae  NOT DETECTED NOT DETECTED Final   Mycoplasma pneumoniae NOT DETECTED NOT DETECTED Final  MRSA PCR Screening     Status: None   Collection Time: 12/27/16  8:23 AM  Result Value Ref Range Status   MRSA by PCR NEGATIVE NEGATIVE Final    Comment:        The GeneXpert MRSA Assay (FDA approved for NASAL specimens only), is one component of a comprehensive MRSA colonization surveillance program. It is not intended to diagnose MRSA infection nor to guide or monitor treatment for MRSA infections.          Radiology Studies: No results found.      Scheduled Meds: . aspirin  150 mg Rectal Daily  . enoxaparin (LOVENOX) injection  40 mg Subcutaneous Q24H  . insulin aspart  0-5 Units Subcutaneous QHS  . insulin aspart  0-9 Units Subcutaneous TID WC  . insulin glargine  10 Units Subcutaneous Daily  . ipratropium-albuterol  3 mL Nebulization QID  . levothyroxine  37.5 mcg Intravenous Daily  . methylPREDNISolone (SOLU-MEDROL) injection  125 mg Intravenous TID   Continuous Infusions: . sodium chloride 10 mL/hr at 12/29/16 0500  . famotidine (PEPCID) IV Stopped (12/30/16 2140)     LOS: 5 days    Time spent: 35 minutes.     Alba Coryegalado, Charlotta Lapaglia A, MD [facility] Pager [phone]  If 7PM-7AM, please contact night-coverage [email or web address] Password [email or web address] 12/31/2016, 5:45 PM

## 2016-12-31 NOTE — Progress Notes (Signed)
RT Note: Patient is presently on a non rebreather mask and is receiving 100% oxygen. Rt went to change out the patient's mask and place her on her nebulizer treatment and she quickly desaturated to the low 80's. She had to be placed back on the non rebreather mask and once her Spo2 came back up, the nebulizer treatment was completed. Patient was asked about going on Bipap but she refused. Although she is not in acute distress on 100% O2, her oxygen is not able to be weaned down at all. She rapidly desaturates without the non rebreather mask. Her RN was at bedside and is aware. Rt will continue to monitor and assist as needed.

## 2017-01-01 LAB — BASIC METABOLIC PANEL
ANION GAP: 10 (ref 5–15)
BUN: 37 mg/dL — ABNORMAL HIGH (ref 6–20)
CALCIUM: 8.9 mg/dL (ref 8.9–10.3)
CO2: 30 mmol/L (ref 22–32)
Chloride: 102 mmol/L (ref 101–111)
Creatinine, Ser: 1.14 mg/dL — ABNORMAL HIGH (ref 0.44–1.00)
GFR, EST AFRICAN AMERICAN: 53 mL/min — AB (ref >60)
GFR, EST NON AFRICAN AMERICAN: 46 mL/min — AB (ref >60)
GLUCOSE: 300 mg/dL — AB (ref 65–99)
Potassium: 4.3 mmol/L (ref 3.5–5.1)
Sodium: 142 mmol/L (ref 135–145)

## 2017-01-01 LAB — CBC
HCT: 37.1 % (ref 36.0–46.0)
Hemoglobin: 11.1 g/dL — ABNORMAL LOW (ref 12.0–15.0)
MCH: 23.6 pg — ABNORMAL LOW (ref 26.0–34.0)
MCHC: 29.9 g/dL — ABNORMAL LOW (ref 30.0–36.0)
MCV: 78.8 fL (ref 78.0–100.0)
PLATELETS: 337 10*3/uL (ref 150–400)
RBC: 4.71 MIL/uL (ref 3.87–5.11)
RDW: 16.5 % — AB (ref 11.5–15.5)
WBC: 13.5 10*3/uL — AB (ref 4.0–10.5)

## 2017-01-01 LAB — GLUCOSE, CAPILLARY
GLUCOSE-CAPILLARY: 220 mg/dL — AB (ref 65–99)
GLUCOSE-CAPILLARY: 218 mg/dL — AB (ref 65–99)
GLUCOSE-CAPILLARY: 188 mg/dL — AB (ref 65–99)
GLUCOSE-CAPILLARY: 163 mg/dL — AB (ref 65–99)
Glucose-Capillary: 249 mg/dL — ABNORMAL HIGH (ref 65–99)
Glucose-Capillary: 187 mg/dL — ABNORMAL HIGH (ref 65–99)

## 2017-01-01 LAB — CULTURE, BLOOD (ROUTINE X 2)
CULTURE: NO GROWTH
CULTURE: NO GROWTH
Special Requests: ADEQUATE
Special Requests: ADEQUATE

## 2017-01-01 MED ORDER — BISACODYL 10 MG RE SUPP
10.0000 mg | Freq: Once | RECTAL | Status: AC
Start: 2017-01-01 — End: 2017-01-01
  Administered 2017-01-01: 10 mg via RECTAL
  Filled 2017-01-01: qty 1

## 2017-01-01 MED ORDER — SENNOSIDES-DOCUSATE SODIUM 8.6-50 MG PO TABS: 1.0000 | ORAL_TABLET | Freq: Two times a day (BID) | ORAL | Status: DC

## 2017-01-01 MED ORDER — INSULIN GLARGINE 100 UNIT/ML ~~LOC~~ SOLN
16.0000 [IU] | Freq: Every day | SUBCUTANEOUS | Status: DC
  Administered 2017-01-01: 16 [IU] via SUBCUTANEOUS
  Filled 2017-01-01 (×3): qty 0.16

## 2017-01-01 NOTE — Plan of Care (Signed)
  Progressing Education: Knowledge of [facility] Education information/materials will improve 01/01/2017 0438 - Progressing by Della Gooummings, Cyntia Staley R, RN Safety: Ability to remain free from injury will improve 01/01/2017 0438 - Progressing by Della Gooummings, Brynleigh Sequeira R, RN Health Behavior/Discharge Planning: Ability to manage health-related needs will improve 01/01/2017 0438 - Progressing by Della Gooummings, Trig Mcbryar R, RN Pain Managment: General experience of comfort will improve 01/01/2017 0438 - Progressing by Della Gooummings, Daylin Gruszka R, RN Physical Regulation: Ability to maintain clinical measurements within normal limits will improve 01/01/2017 0438 - Progressing by Della Gooummings, Debria Broecker R, RN Will remain free from infection 01/01/2017 0438 - Progressing by Della Gooummings, Salil Raineri R, RN Skin Integrity: Risk for impaired skin integrity will decrease 01/01/2017 0438 - Progressing by Della Gooummings, Amea Mcphail R, RN Tissue Perfusion: Risk factors for ineffective tissue perfusion will decrease 01/01/2017 0438 - Progressing by Della Gooummings, Alynn Ellithorpe R, RN Fluid Volume: Ability to maintain a balanced intake and output will improve 01/01/2017 0438 - Progressing by Della Gooummings, Lorrane Mccay R, RN Spiritual Needs Ability to function at adequate level 01/01/2017 0438 - Progressing by Della Gooummings, Chinenye Katzenberger R, RN

## 2017-01-01 NOTE — Progress Notes (Signed)
PROGRESS NOTE    Kim [name]  [id number] DOB: March 05, 1940 DOA: 01/06/2017 PCP: Renaye RakersBland, Veita, MD   Brief Narrative: 76 year old female, lives with family, independent of activities, PMH of severe ILD, chronic hypoxic respiratory failure >oxygen dependent on 2 L oxygen at rest and 6 L with activity & steroid dependent (Dr. Sherene SiresWert, pulmonology), HTN, HLD, GERD, DM, hypothyroid, stage II chronic kidney disease reports progressively worsening dyspnea over the past couple of days, chronic cough with intermittent clear sputum which is not changed, chills without fevers and no chest pains. Denies sick contacts at home. Self-limiting nausea, vomiting and diarrhea 2 days ago. In the ED hypoxic in the 60s on 2 L. Has been on BiPAP since ED arrival 11/1. Chest x-ray shows pulmonary fibrosis, BNP normal, RSV panel negative. Admitted to stepdown for acute on chronic hypoxic respiratory failure due to pulmonary fibrosis flare. Pulmonology consulted.       Assessment & Plan:   Principal Problem:   Acute on chronic respiratory failure with hypoxia (HCC) Active Problems:   HTN (hypertension)   Hypothyroidism   Sepsis (HCC)   Chronic kidney disease (CKD), stage II (mild)   Nausea vomiting and diarrhea   Type II diabetes mellitus with renal manifestations (HCC)   Encounter for palliative care   Acute on chronic hypoxic respiratory failure secondary to severe/end-stage pulmonary fibrosis flare - At baseline patient is oxygen dependent (2L at rest and 6L with activity) and steroid dependent (15 mg prednisone daily). - Low index of suspicion for infectious etiology. No fever, only mild leukocytosis, RSV panel negative, chest x-ray shows chronic interstitial lung disease without pneumonia. Blood cultures negative to date - Continue IV Solu-Medrol 125 MG 3 times a day, scheduled DuoNeb's, Xopenex when necessary, antitussives, empiric azithromycin - D-dimer 0.62. Low index of suspicion for  PE. -continue with BiPAP as needed during the day and mandatory at night.  --received one time dose lasix.  -palliative care following. Plan to continue with current care, awaiting for rest family, meeting again tomorrow with palliative, plan to transition to comfort.   SIRS - Likely related to acute respiratory failure rather than true sepsis. - Influenza panel PCR and RSV panel PCR negative. Lactate normalized. PCT Neg - Continue azithromycin.    DM 2 - Last A1c: 6. Hold metformin. SSI.  Now uncontrolled, precipitated by steroids.  -on lantus. Increase lantus.   Essential hypertension Continue with  Hydralazine PRN  Hypothyroid TSH 0.779 on 02/06/16. Continue Synthroid.  Stage II chronic kidney disease: Baseline creatinine 1-1.1. Presented with creatinine of 1.13. Stable.  Self-limiting nausea, vomiting and diarrhea Seems to have resolved. Monitor.  Microcytic anemia Repeat labs in am.        DVT prophylaxis: Lovenox.  Code Status: DNR  Family Communication: daughter at bedside.  Disposition Plan: to be determine    Consultants:   CCM  Palliative care.    Procedures:  BIPAP   Antimicrobials: azithromycin.    Subjective: She is on BIPAP, sleepy, complaints of been cold.    Objective: Vitals:   01/01/17 0807 01/01/17 1141 01/01/17 1355 01/01/17 1359  BP: (!) 166/90 (!) 164/115    Pulse: (!) 112 (!) 111 100 100  Resp: (!) 41 (!) 37 (!) 30 (!) 26  Temp:      TempSrc:      SpO2: 94%  97% 98%  Weight:      Height:        Intake/Output Summary (Last 24 hours) at 01/01/2017 1458 Last data  filed at 01/01/2017 0428 Gross per 24 hour  Intake 600 ml  Output 250 ml  Net 350 ml   Filed Weights   12/27/16 0827 12/27/16 1959 12/29/16 0450  Weight: 83 kg (182 lb 15.7 oz) 89 kg (196 lb 3.4 oz) 82.9 kg (182 lb 12.2 oz)    Examination:  General exam:  On BIPAP Respiratory system: Tachypnea, bilateral ronchus.  Cardiovascular system:  S 1, S 2  RRR Gastrointestinal system: BS present, soft, nt Central nervous system: sleepy  Extremities: Symmetric 5 x 5 power. Skin: No rashes, lesions or ulcers     Data Reviewed: I have personally reviewed following labs and imaging studies  CBC: Recent Labs  Lab 01/18/2017 1749 12/30/16 0545 01/01/17 0533  WBC 13.2* 14.3* 13.5*  NEUTROABS 12.0*  --   --   HGB 11.3* 10.5* 11.1*  HCT 36.9 34.2* 37.1  MCV 77.7* 77.7* 78.8  PLT 328 338 337   Basic Metabolic Panel: Recent Labs  Lab 01/23/2017 1749 12/30/16 0545 12/30/16 2021 01/01/17 0533  NA 135 144 144 142  K 4.4 3.6 3.3* 4.3  CL 96* 104 104 102  CO2 28 30 30 30   GLUCOSE 196* 158* 259* 300*  BUN 14 28* 34* 37*  CREATININE 1.13* 1.11* 1.27* 1.14*  CALCIUM 8.8* 8.9 8.9 8.9   GFR: Estimated Creatinine Clearance: 49.1 mL/min (A) (by C-G formula based on SCr of 1.14 mg/dL (H)). Liver Function Tests: Recent Labs  Lab 01/05/2017 1749  AST 18  ALT 13*  ALKPHOS 67  BILITOT 0.5  PROT 6.4*  ALBUMIN 3.0*   Recent Labs  Lab 01/13/2017 1749  LIPASE 23   No results for input(s): AMMONIA in the last 168 hours. Coagulation Profile: No results for input(s): INR, PROTIME in the last 168 hours. Cardiac Enzymes: No results for input(s): CKTOTAL, CKMB, CKMBINDEX, TROPONINI in the last 168 hours. BNP (last 3 results) Recent Labs    03/01/16 1537  PROBNP 31.0   HbA1C: No results for input(s): HGBA1C in the last 72 hours. CBG: Recent Labs  Lab 12/31/16 1659 12/31/16 2135 01/01/17 0759 01/01/17 1134 01/01/17 1233  GLUCAP 238* 233* 249* 220* 218*   Lipid Profile: No results for input(s): CHOL, HDL, LDLCALC, TRIG, CHOLHDL, LDLDIRECT in the last 72 hours. Thyroid Function Tests: No results for input(s): TSH, T4TOTAL, FREET4, T3FREE, THYROIDAB in the last 72 hours. Anemia Panel: No results for input(s): VITAMINB12, FOLATE, FERRITIN, TIBC, IRON, RETICCTPCT in the last 72 hours. Sepsis Labs: Recent Labs  Lab 01/21/2017 2243  12/27/16 0329  PROCALCITON <0.10  --   LATICACIDVEN 3.8* 1.5    Recent Results (from the past 240 hour(s))  Culture, blood (routine x 2) Call MD if unable to obtain prior to antibiotics being given     Status: None   Collection Time: 01/15/2017 10:55 PM  Result Value Ref Range Status   Specimen Description BLOOD RIGHT WRIST  Final   Special Requests   Final    BOTTLES DRAWN AEROBIC ONLY Blood Culture adequate volume   Culture NO GROWTH 5 DAYS  Final   Report Status 01/01/2017 FINAL  Final  Culture, blood (routine x 2) Call MD if unable to obtain prior to antibiotics being given     Status: None   Collection Time: 01/09/2017 11:07 PM  Result Value Ref Range Status   Specimen Description BLOOD RIGHT HAND  Final   Special Requests   Final    BOTTLES DRAWN AEROBIC AND ANAEROBIC Blood Culture adequate volume  Culture NO GROWTH 5 DAYS  Final   Report Status 01/01/2017 FINAL  Final  Respiratory Panel by PCR     Status: None   Collection Time: 2017/01/09 11:10 PM  Result Value Ref Range Status   Adenovirus NOT DETECTED NOT DETECTED Final   Coronavirus 229E NOT DETECTED NOT DETECTED Final   Coronavirus HKU1 NOT DETECTED NOT DETECTED Final   Coronavirus NL63 NOT DETECTED NOT DETECTED Final   Coronavirus OC43 NOT DETECTED NOT DETECTED Final   Metapneumovirus NOT DETECTED NOT DETECTED Final   Rhinovirus / Enterovirus NOT DETECTED NOT DETECTED Final   Influenza A NOT DETECTED NOT DETECTED Final   Influenza B NOT DETECTED NOT DETECTED Final   Parainfluenza Virus 1 NOT DETECTED NOT DETECTED Final   Parainfluenza Virus 2 NOT DETECTED NOT DETECTED Final   Parainfluenza Virus 3 NOT DETECTED NOT DETECTED Final   Parainfluenza Virus 4 NOT DETECTED NOT DETECTED Final   Respiratory Syncytial Virus NOT DETECTED NOT DETECTED Final   Bordetella pertussis NOT DETECTED NOT DETECTED Final   Chlamydophila pneumoniae NOT DETECTED NOT DETECTED Final   Mycoplasma pneumoniae NOT DETECTED NOT DETECTED Final    MRSA PCR Screening     Status: None   Collection Time: 12/27/16  8:23 AM  Result Value Ref Range Status   MRSA by PCR NEGATIVE NEGATIVE Final    Comment:        The GeneXpert MRSA Assay (FDA approved for NASAL specimens only), is one component of a comprehensive MRSA colonization surveillance program. It is not intended to diagnose MRSA infection nor to guide or monitor treatment for MRSA infections.          Radiology Studies: No results found.      Scheduled Meds: . aspirin  150 mg Rectal Daily  . enoxaparin (LOVENOX) injection  40 mg Subcutaneous Q24H  . insulin aspart  0-5 Units Subcutaneous QHS  . insulin aspart  0-9 Units Subcutaneous TID WC  . insulin glargine  16 Units Subcutaneous Daily  . ipratropium-albuterol  3 mL Nebulization QID  . levothyroxine  37.5 mcg Intravenous Daily  . methylPREDNISolone (SOLU-MEDROL) injection  125 mg Intravenous TID  . potassium chloride  20 mEq Oral Once  . senna-docusate  1 tablet Oral BID   Continuous Infusions: . sodium chloride 10 mL/hr at 12/29/16 0500  . azithromycin Stopped (01/01/17 0528)  . famotidine (PEPCID) IV Stopped (12/31/16 2356)     LOS: 6 days    Time spent: 35 minutes.     Alba Cory, MD [facility] Pager [phone]  If 7PM-7AM, please contact night-coverage [email or web address] Password [email or web address] 01/01/2017, 2:58 PM

## 2017-01-01 NOTE — Progress Notes (Addendum)
Palliative care progress note  Reason for visit: Goals of care in light of continued respiratory failure with continued BiPAP dependence  I met with Ms. Arcos, her daughter, her son, and her grandson in conjunction with Royden Purl, NP from palliative medicine team  We discussed her clinical course, continue BiPAP dependence, and her conversation with Dr. Melvyn Novas.  We then wishes moving forward in regard to  her care this hospitalization.    Asked her for her thoughts are on what we should be working toward moving forward.  She reports that she is "tired" and "ready to go home."  At that point, her grandson asked her if she met home to her house or home to be with God.  She replied that she meant she was ready to die and go to heaven.  We then discussed difference between a aggressive medical intervention path and a palliative, comfort focused care path.    Ms. Steeves then endorsed that she believes we should move forward with a plan for comfort care and coming off of BiPAP altogether.  We talked about the timing of this and she has a son who is coming in from [place] as well as another relative coming from [place].  She would like to continue with current care at this time and is hopeful that they will have a chance to come and visit with her later today.  We discussed a plan to remeet again tomorrow at 9 AM to continue conversation regarding goals as well as likely transition to full comfort care and taking BiPAP out of her care plan.  Overall, her family endorse that her not suffering is very important and I would continue to use medications as well as allowing her to guide our decisions moving forward with her care based upon what she needs to be comfortable.  Plan for follow-up meeting tomorrow at 9 AM Total time: 45 minutes Greater than 50%  of this time was spent counseling and coordinating care related to the above assessment and plan.  Micheline Rough, MD [facility]  Team [phone]

## 2017-01-01 NOTE — Progress Notes (Signed)
Inpatient Diabetes Program Recommendations  AACE/ADA: New Consensus Statement on Inpatient Glycemic Control (2015)  Target Ranges:  Prepandial:   less than 140 mg/dL      Peak postprandial:   less than 180 mg/dL (1-2 hours)      Critically ill patients:  140 - 180 mg/dL   Results for Kim [name], Kim [name] (MRN [id number]) as of 01/01/2017 09:55  Ref. Range 12/31/2016 08:09 12/31/2016 12:33 12/31/2016 16:59 12/31/2016 21:35 01/01/2017 07:59  Glucose-Capillary Latest Ref Range: 65 - 99 mg/dL 562163 (H) 130192 (H) 865238 (H) 233 (H) 249 (H)    Review of Glycemic Control  Diabetes history: DM2 Outpatient Diabetes medications: Metformin 500 mg BID Current orders for Inpatient glycemic control: Lantus 16 units daily, Novolog 0-9 units TID with meals, Novolog 0-5 units QHS  Inpatient Diabetes Program Recommendations: Insulin - Basal: Noted Lantus was increased today from 10 to 16 units daily. Insulin - Meal Coverage: If steroids are continued, please consider ordering Novolog 3 units TID with meals for meal coverage if patient eats at least 50% of meals.  NOTE: Ordered Solumedrol 125 mg TID which is contributing to hyperglycemia.  Thanks, Orlando PennerMarie Allice Garro, RN, MSN, CDE Diabetes Coordinator Inpatient Diabetes Program [phone] (Team Pager from 8am to 5pm)

## 2017-01-01 NOTE — Progress Notes (Signed)
Pulmonary/ courtesy note  I met with pt and daughter and reviewed her records since my last ov and assured them both that everything we could offer has already been tried.  Further,  Although she has rebounded multiple times in past with increase in steroid rx it does appear this time they are really not helping that much and bipap can only be used temporarily in this setting  I would therefore rec focus on comfort issues with an understanding that   high likelihood of prolonging suffering from pulmonary interventions vastly outweighs any reasonable chance of benefit from offering anything else because medical science has done all it can here to restore health.  Therefore  I don't have any additional recs  except to consider hospice sooner rather than later - paradoxically many patients with respiratory diseases live longer and better once a palliative approach is used in this setting.    Please call if questions arise.  I appreciate all the inpt staff has done for this nice lady.   Christinia Gully, MD Pulmonary and [place] [phone] After 5:30 PM or weekends, use Beeper [phone]

## 2017-01-02 DIAGNOSIS — J9621 Acute and chronic respiratory failure with hypoxia: Secondary | ICD-10-CM

## 2017-01-02 LAB — GLUCOSE, CAPILLARY: GLUCOSE-CAPILLARY: 231 mg/dL — AB (ref 65–99)

## 2017-01-02 MED ORDER — MORPHINE BOLUS VIA INFUSION
2.0000 mg | INTRAVENOUS | Status: DC | PRN
  Administered 2017-01-02 – 2017-01-03 (×4): 2 mg via INTRAVENOUS
  Administered 2017-01-03: 4 mg via INTRAVENOUS
  Filled 2017-01-02: qty 4

## 2017-01-02 MED ORDER — LORAZEPAM 2 MG/ML IJ SOLN
1.0000 mg | INTRAMUSCULAR | Status: DC | PRN
  Administered 2017-01-02: 1 mg via INTRAVENOUS
  Filled 2017-01-02 (×2): qty 1

## 2017-01-02 MED ORDER — SODIUM CHLORIDE 0.9 % IV SOLN
2.0000 mg/h | INTRAVENOUS | Status: DC
  Administered 2017-01-02: 1 mg/h via INTRAVENOUS
  Filled 2017-01-02: qty 10

## 2017-01-02 MED ORDER — MORPHINE SULFATE (PF) 4 MG/ML IV SOLN
2.0000 mg | INTRAVENOUS | Status: DC | PRN
  Administered 2017-01-02: 2 mg via INTRAVENOUS
  Administered 2017-01-02: 4 mg via INTRAVENOUS
  Administered 2017-01-02: 2 mg via INTRAVENOUS
  Filled 2017-01-02 (×3): qty 1

## 2017-01-02 NOTE — Care Management Important Message (Signed)
Important Message  Patient Details  Name: Kim [name] MRN: [id number] Date of Birth: [birth year]   Medicare Important Message Given:  Yes    Kyla BalzarineShealy, Tekeisha Hakim Abena 01/02/2017, 12:53 PM

## 2017-01-02 NOTE — Progress Notes (Signed)
Palliative Medicine RN Note: Symptom check. Pt is moaning, grabbing blanket, grimacing, moving head, with RR around 35-40. RN is giving prn 4mg  morphine. Pt relaxed considerably about 5 minutes after administration. Called Dr Neale BurlyFreeman for update. He ordered initiation of continuous infusion with the same dose of morphine for breakthrough. Confirmed KVO order in place.   I spoke with family and explained pt's symptoms and our concern for her comfort. Explained s/s distress (including all above), and they are very supportive of decision for continuous morphine. I also explained that she still has generous doses available if she needs more than the infusion and that her attending can adjust the medications if PMT is not available.   Plan for a team member to see her in the morning. Per Dr Neale BurlyFreeman, pt is not yet full comfort care.   Margret ChanceMelanie G. Serafino Burciaga, RN, BSN, [facility] 01/02/2017 4:44 PM Cell [phone] 8:00-4:00 Monday-Friday Office [phone]

## 2017-01-02 NOTE — Progress Notes (Signed)
Palliative Care Progress Notes  Reason for consult: Goals of care in light of end stage ILD. Discontinuation of Bipap support  I met this AM with patient and her family in conjunction with Kathie Rhodes, NP from palliative care.  Ms. Seaborn seems more agitated this morning.  She has multiple family members at bedside.  We discussed again plan for removal of Bipap and continuation of other care to see how she does.  She has been clear overall that she is ready to die if it is her time, and she wants to focus on being comfortable moving forward.  I was present at time of Bipap removal and stayed on the unit while working to titrate medications to ensure comfort.  She was pretreated with morphine and ativan and was resting comfortably prior to Bipap removal.  Following removal of Bipap, she continued to rest comfortably unless stimulated for further care (refused mouth care).  She was satting well on non-rebreather.  Family remained present in room throughout.  Plan moving forward is continuation of current care with no escalation of care and no further Bipap.  In the event of decompensation, goals is full comfort care.    I liberalized her PRN medications (morphine 2-4 mg Q 20 minutes, ativan 69m Q 4 hours as needed, robinul as needed.)  I discussed with family that her course over next several hours is unknown, plan to continue current care other than Bipap, and usage of medications with comfort as focus if she continues to decompensate, which I feel is likely.  Total time: 55  Greater than 50%  of this time was spent counseling and coordinating care related to the above assessment and plan.  GMicheline Rough MD [facility] [phone]

## 2017-01-02 NOTE — Progress Notes (Signed)
Name: Kim [name] MRN: [id number] DOB: 07-Sep-1940    ADMISSION DATE:  01/10/2017 CONSULTATION DATE:  12/27/2016  REFERRING MD :  Algis Liming  CHIEF COMPLAINT:  Acute on chronic respiratory failure in setting of progressive ILD.   BRIEF PATIENT DESCRIPTION:  76 year old female former smoker with history of ILD from remote lung injury, hypertension, GERD, diabetes followed in the outpatient pulmonary office by Dr. work.  She is prednisone dependent 15 mg daily, oxygen dependent (2 L at rest 6 L with exertion).  Patient was admitted 11/1 with acute on chronic respiratory failure requiring BiPAP.  This was felt likely secondary to flare of end-stage pulmonary fibrosis.  She has continued on and off BiPAP.   SIGNIFICANT EVENTS  Recent Admission 12/04/2016-12/09/2016 for ILD Flare 01/06/2017>> Admission  STUDIES:  CXR 01/24/2017>>Stable exam. 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  HRCT 12/05/2016>>  Progressive basilar predominant severe fibrotic interstitial lung disease with extensive honeycombing, diagnostic of usual interstitial pneumonia (UIP). Stable trace dependent bilateral pleural effusions/mild pleural thickening. Three-vessel coronary atherosclerosis. Stable mild reactive mediastinal lymphadenopathy.  Cultures:  01/17/2017>> RVP>> Negative 12/30/2016>> Blood>>   SUBJECTIVE:  Remains on BiPAP.  Met with palliative care team this morning with plans for transition to comfort measures.  She remains on BiPAP now while waiting for 1 additional family member to come.  PRN morphine is helping some although she is a bit uncomfortable right now.  Family members at bedside    VITAL SIGNS: Temp:  [96.7 F (35.9 C)-97.4 F (36.3 C)] 96.7 F (35.9 C) (11/08 0802) Pulse Rate:  [94-127] 114 (11/08 0802) Resp:  [23-43] 23 (11/08 0802) BP: (128-168)/(3-115) 144/83 (11/08 0802) SpO2:  [93 %-100 %] 96 % (11/08  0802) FiO2 (%):  [60 %] 60 % (11/08 0745)  PHYSICAL EXAMINATION: General:  Elderly female, resting in bed, uncomfortable appearing Neuro:  Awake and alert,  No deficits HEENT: Normocephalic. Atraumatic, NRB in place. Cardiovascular: S1, S2, RRR No RMG. Lungs: Respirations are even and mildly labored on BiPAP, crackles throughout Abdomen:  Soft, Flat, Non-distended, Non-tender, BS diminished. Musculoskeletal: No obvious deformities, no joint redness or warmth. Skin:  Warm Dry and intact, no rash or lesions noted.  Recent Labs  Lab 12/30/16 0545 12/30/16 2021 01/01/17 0533  NA 144 144 142  K 3.6 3.3* 4.3  CL 104 104 102  CO2 _0 BUN 28* 34* 37*  CREATININE 1.11* 1.27* 1.14*  GLUCOSE 158* 259* 300*   Recent Labs  Lab 01/17/2017 1749 12/30/16 0545 01/01/17 0533  HGB 11.3* 10.5* 11.1*  HCT 36.9 34.2* 37.1  WBC 13.2* 14.3* 13.5*  PLT 328 338 337   No results found.  ASSESSMENT / PLAN:  Acute on Chronic Hypoxic Respiratory Failure: 2/2 Progressive End Stage Pulmonary Fibrosis Flare - Has had trouble weaning from BiPAP due to hypoxia, anxiety, increased WOB. 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

## 2017-01-02 NOTE — Progress Notes (Signed)
RT removed bipap and placed on NRB and transitioning into comfort care. MD, RN, and family at bedside at this time. No complications. Patient tolerating well at this time. RT will continue to monitor.

## 2017-01-02 NOTE — Progress Notes (Signed)
PROGRESS NOTE    Kim [name]  JYN:[id number] DOB: 1940/09/05 DOA: 01/07/2017 PCP: Renaye RakersBland, Veita, MD   Brief Narrative: 76 year old female, lives with family, independent of activities, PMH of severe ILD, chronic hypoxic respiratory failure >oxygen dependent on 2 L oxygen at rest and 6 L with activity & steroid dependent (Dr. Sherene SiresWert, pulmonology), HTN, HLD, GERD, DM, hypothyroid, stage II chronic kidney disease reports progressively worsening dyspnea over the past couple of days, chronic cough with intermittent clear sputum which is not changed, chills without fevers and no chest pains. Denies sick contacts at home. Self-limiting nausea, vomiting and diarrhea 2 days ago. In the ED hypoxic in the 60s on 2 L. Has been on BiPAP since ED arrival 11/1. Chest x-ray shows pulmonary fibrosis, BNP normal, RSV panel negative. Admitted to stepdown for acute on chronic hypoxic respiratory failure due to pulmonary fibrosis flare. Pulmonology consulted.       Assessment & Plan:   Principal Problem:   Acute on chronic respiratory failure with hypoxia (HCC) Active Problems:   HTN (hypertension)   Hypothyroidism   Sepsis (HCC)   Chronic kidney disease (CKD), stage II (mild)   Nausea vomiting and diarrhea   Type II diabetes mellitus with renal manifestations (HCC)   Encounter for palliative care   Acute on chronic hypoxic respiratory failure secondary to severe/end-stage pulmonary fibrosis flare - At baseline patient is oxygen dependent (2L at rest and 6L with activity) and steroid dependent (15 mg prednisone daily). - Low index of suspicion for infectious etiology. No fever, only mild leukocytosis, RSV panel negative, chest x-ray shows chronic interstitial lung disease without pneumonia. Blood cultures negative to date - Continue IV Solu-Medrol 125 MG 3 times a day, scheduled DuoNeb's, Xopenex when necessary, antitussives, empiric azithromycin - D-dimer 0.62. Low index of suspicion for PE. -plan  is to continue current care, steroids, IV morphine, and discontinuation of BIPAP. If further decompensated will be full comfort care. Appreciate Dr Neale BurlyFreeman help.    SIRS - Likely related to acute respiratory failure rather than true sepsis. - Influenza panel PCR and RSV panel PCR negative. Lactate normalized. PCT Neg - Continue azithromycin.    DM 2 - Last A1c: 6. Hold metformin. SSI.  Now uncontrolled, precipitated by steroids.  -on lantus.  Essential hypertension Continue with  Hydralazine PRN  Hypothyroid TSH 0.779 on 02/06/16. Continue Synthroid.  Stage II chronic kidney disease: Baseline creatinine 1-1.1. Presented with creatinine of 1.13. Stable.  Self-limiting nausea, vomiting and diarrhea Seems to have resolved. Monitor.  Microcytic anemia Repeat labs in am.        DVT prophylaxis: Lovenox.  Code Status: DNR  Family Communication: daughter at bedside.  Disposition Plan: to be determine    Consultants:   CCM  Palliative care.    Procedures:  BIPAP   Antimicrobials: azithromycin.    Subjective: Off BIPAP, multiples family member at bedside.  She appears comfortable.    Objective: Vitals:   01/01/17 2323 01/02/17 0300 01/02/17 0745 01/02/17 0802  BP:   (!) 128/3 (!) 144/83  Pulse: (!) 108  94 (!) 114  Resp: (!) 33  (!) 25 (!) 23  Temp:  (!) 97 F (36.1 C)  (!) 96.7 F (35.9 C)  TempSrc:  Axillary  Axillary  SpO2: 99%   96%  Weight:      Height:        Intake/Output Summary (Last 24 hours) at 01/02/2017 1442 Last data filed at 01/01/2017 1854 Gross per 24 hour  Intake -  Output 850 ml  Net -850 ml   Filed Weights   12/27/16 0827 12/27/16 1959 12/29/16 0450  Weight: 83 kg (182 lb 15.7 oz) 89 kg (196 lb 3.4 oz) 82.9 kg (182 lb 12.2 oz)    Examination:  General exam:  Lethargic, mild tachypnea,  Respiratory system: Bilateral ronchus  Cardiovascular system; S 1, S 2 RRR Gastrointestinal system: BS present, soft,  nt Central nervous system:lethargic  Extremities: no edema Skin: No rashes, lesions or ulcers     Data Reviewed: I have personally reviewed following labs and imaging studies  CBC: Recent Labs  Lab 01/15/2017 1749 12/30/16 0545 01/01/17 0533  WBC 13.2* 14.3* 13.5*  NEUTROABS 12.0*  --   --   HGB 11.3* 10.5* 11.1*  HCT 36.9 34.2* 37.1  MCV 77.7* 77.7* 78.8  PLT 328 338 337   Basic Metabolic Panel: Recent Labs  Lab 01/13/2017 1749 12/30/16 0545 12/30/16 2021 01/01/17 0533  NA 135 144 144 142  K 4.4 3.6 3.3* 4.3  CL 96* 104 104 102  CO2 28 30 30 30   GLUCOSE 196* 158* 259* 300*  BUN 14 28* 34* 37*  CREATININE 1.13* 1.11* 1.27* 1.14*  CALCIUM 8.8* 8.9 8.9 8.9   GFR: Estimated Creatinine Clearance: 49.1 mL/min (A) (by C-G formula based on SCr of 1.14 mg/dL (H)). Liver Function Tests: Recent Labs  Lab 01/09/2017 1749  AST 18  ALT 13*  ALKPHOS 67  BILITOT 0.5  PROT 6.4*  ALBUMIN 3.0*   Recent Labs  Lab 12/28/2016 1749  LIPASE 23   No results for input(s): AMMONIA in the last 168 hours. Coagulation Profile: No results for input(s): INR, PROTIME in the last 168 hours. Cardiac Enzymes: No results for input(s): CKTOTAL, CKMB, CKMBINDEX, TROPONINI in the last 168 hours. BNP (last 3 results) Recent Labs    03/01/16 1537  PROBNP 31.0   HbA1C: No results for input(s): HGBA1C in the last 72 hours. CBG: Recent Labs  Lab 01/01/17 1233 01/01/17 1505 01/01/17 1742 01/01/17 2141 01/02/17 0800  GLUCAP 218* 188* 187* 163* 231*   Lipid Profile: No results for input(s): CHOL, HDL, LDLCALC, TRIG, CHOLHDL, LDLDIRECT in the last 72 hours. Thyroid Function Tests: No results for input(s): TSH, T4TOTAL, FREET4, T3FREE, THYROIDAB in the last 72 hours. Anemia Panel: No results for input(s): VITAMINB12, FOLATE, FERRITIN, TIBC, IRON, RETICCTPCT in the last 72 hours. Sepsis Labs: Recent Labs  Lab 01/09/2017 2243 12/27/16 0329  PROCALCITON <0.10  --   LATICACIDVEN 3.8* 1.5     Recent Results (from the past 240 hour(s))  Culture, blood (routine x 2) Call MD if unable to obtain prior to antibiotics being given     Status: None   Collection Time: 12/29/2016 10:55 PM  Result Value Ref Range Status   Specimen Description BLOOD RIGHT WRIST  Final   Special Requests   Final    BOTTLES DRAWN AEROBIC ONLY Blood Culture adequate volume   Culture NO GROWTH 5 DAYS  Final   Report Status 01/01/2017 FINAL  Final  Culture, blood (routine x 2) Call MD if unable to obtain prior to antibiotics being given     Status: None   Collection Time: 12/30/2016 11:07 PM  Result Value Ref Range Status   Specimen Description BLOOD RIGHT HAND  Final   Special Requests   Final    BOTTLES DRAWN AEROBIC AND ANAEROBIC Blood Culture adequate volume   Culture NO GROWTH 5 DAYS  Final   Report Status 01/01/2017 FINAL  Final  Respiratory Panel by PCR     Status: None   Collection Time: 12/30/2016 11:10 PM  Result Value Ref Range Status   Adenovirus NOT DETECTED NOT DETECTED Final   Coronavirus 229E NOT DETECTED NOT DETECTED Final   Coronavirus HKU1 NOT DETECTED NOT DETECTED Final   Coronavirus NL63 NOT DETECTED NOT DETECTED Final   Coronavirus OC43 NOT DETECTED NOT DETECTED Final   Metapneumovirus NOT DETECTED NOT DETECTED Final   Rhinovirus / Enterovirus NOT DETECTED NOT DETECTED Final   Influenza A NOT DETECTED NOT DETECTED Final   Influenza B NOT DETECTED NOT DETECTED Final   Parainfluenza Virus 1 NOT DETECTED NOT DETECTED Final   Parainfluenza Virus 2 NOT DETECTED NOT DETECTED Final   Parainfluenza Virus 3 NOT DETECTED NOT DETECTED Final   Parainfluenza Virus 4 NOT DETECTED NOT DETECTED Final   Respiratory Syncytial Virus NOT DETECTED NOT DETECTED Final   Bordetella pertussis NOT DETECTED NOT DETECTED Final   Chlamydophila pneumoniae NOT DETECTED NOT DETECTED Final   Mycoplasma pneumoniae NOT DETECTED NOT DETECTED Final  MRSA PCR Screening     Status: None   Collection Time: 12/27/16   8:23 AM  Result Value Ref Range Status   MRSA by PCR NEGATIVE NEGATIVE Final    Comment:        The GeneXpert MRSA Assay (FDA approved for NASAL specimens only), is one component of a comprehensive MRSA colonization surveillance program. It is not intended to diagnose MRSA infection nor to guide or monitor treatment for MRSA infections.          Radiology Studies: No results found.      Scheduled Meds: . aspirin  150 mg Rectal Daily  . enoxaparin (LOVENOX) injection  40 mg Subcutaneous Q24H  . insulin aspart  0-5 Units Subcutaneous QHS  . insulin aspart  0-9 Units Subcutaneous TID WC  . insulin glargine  16 Units Subcutaneous Daily  . levothyroxine  37.5 mcg Intravenous Daily  . methylPREDNISolone (SOLU-MEDROL) injection  125 mg Intravenous TID  . potassium chloride  20 mEq Oral Once  . senna-docusate  1 tablet Oral BID   Continuous Infusions: . sodium chloride 10 mL/hr at 12/29/16 0500  . azithromycin Stopped (01/02/17 0616)  . famotidine (PEPCID) IV Stopped (01/01/17 2344)     LOS: 7 days    Time spent: 35 minutes.     Alba Cory, MD [facility] Pager [phone]  If 7PM-7AM, please contact night-coverage [email or web address] Password [email or web address] 01/02/2017, 2:42 PM

## 2017-01-03 MED ORDER — GLYCOPYRROLATE 0.2 MG/ML IJ SOLN: 0.2000 mg | Freq: Four times a day (QID) | INTRAMUSCULAR | Status: DC | PRN

## 2017-01-04 ENCOUNTER — Other Ambulatory Visit: Payer: Self-pay | Admitting: Internal Medicine

## 2017-01-25 NOTE — Progress Notes (Signed)
Partial bath given.

## 2017-01-25 NOTE — Progress Notes (Signed)
Palliative Care Progress Notes  Reason for consult: Goals of care in light of end stage ILD. Discontinuation of Bipap support  I met this AM with patient and her family.  She was also seen this AM by Larina Bras from PMT.    Kim [name] is more somnolent today.  She has multiple family members at bedside.  We discussed clinical course over the last 24 hours and that she appears to be continuing to decline.  Her family is in agreement that her wish would be to pursue full comfort care.  We discussed removal of non-rebreather in favor of nasal cannula, completely discontinuing monitor, and possible transfer to another floor (likely 6N) if she maintains her sats with transition to nasal cannula.  I do think that it is likely she will decompensate off non-rebreather, but family agrees that since she is past point of having meaningful interactions, we are now just prolonging process of her dying.  I asked her RN to increase basal rate of morphine, give bolus dose now, and transition to [place] in a little while after medication has had chance to take effect.   Would consider transfer to 6N later today if se does not worsen with transition off of facemask.  I discussed with family that her course over next several hours is unknown.  They expressed understanding this.  Total time: 40  Greater than 50%  of this time was spent counseling and coordinating care related to the above assessment and plan.  Kim Rough, MD [place] [facility] [phone]

## 2017-01-25 NOTE — Progress Notes (Signed)
Nurse secretary shared patient was recently  placed on comfort care. Family was present so I went and visited with them right then. Shared what a gift comfort care is in that the patient does not have to suffer. She can be comfortable and family can be at peace that she is not suffering and they can enjoy their time together.  Family seemed at peace with that even though it wasn't easy. Had prayer--family surrounded her and I place my hand on her shoulder-patient became very alert and cried during the prayer. Shared a few scriptures and family indicated they would like to have it available for their use.  Phebe CollaDonna S Tiffney Haughton, Chaplain   01/13/2017 1200  Clinical Encounter Type  Visited With Patient and family together  Visit Type Follow-up;Spiritual support;Other (Comment)  Referral From (patient - comfort care)  Consult/Referral To Chaplain  Spiritual Encounters  Spiritual Needs Sacred text;Literature;Prayer;Emotional  Stress Factors  Patient Stress Factors Other (Comment)  Family Stress Factors Loss;Other (Comment) (not able to communicate)

## 2017-01-25 NOTE — Progress Notes (Signed)
PROGRESS NOTE    Kim [name]  DGL:[id number] DOB: [birth year] DOA: Jan 23, 2017 PCP: Renaye Rakers, MD   Brief Narrative: 76 year old female, lives with family, independent of activities, PMH of severe ILD, chronic hypoxic respiratory failure >oxygen dependent on 2 L oxygen at rest and 6 L with activity & steroid dependent (Dr. Sherene Sires, pulmonology), HTN, HLD, GERD, DM, hypothyroid, stage II chronic kidney disease reports progressively worsening dyspnea over the past couple of days, chronic cough with intermittent clear sputum which is not changed, chills without fevers and no chest pains. Denies sick contacts at home. Self-limiting nausea, vomiting and diarrhea 2 days ago. In the ED hypoxic in the 60s on 2 L. Has been on BiPAP since ED arrival 11/1. Chest x-ray shows pulmonary fibrosis, BNP normal, RSV panel negative. Admitted to stepdown for acute on chronic hypoxic respiratory failure due to pulmonary fibrosis flare. Pulmonology consulted.       Assessment & Plan:   Principal Problem:   Acute on chronic respiratory failure with hypoxia (HCC) Active Problems:   HTN (hypertension)   Hypothyroidism   Sepsis (HCC)   Chronic kidney disease (CKD), stage II (mild)   Nausea vomiting and diarrhea   Type II diabetes mellitus with renal manifestations (HCC)   Encounter for palliative care   Acute on chronic hypoxic respiratory failure secondary to severe/end-stage pulmonary fibrosis flare - At baseline patient is oxygen dependent (2L at rest and 6L with activity) and steroid dependent (15 mg prednisone daily). - Low index of suspicion for infectious etiology. No fever, only mild leukocytosis, RSV panel negative, chest x-ray shows chronic interstitial lung disease without pneumonia. Blood cultures negative to date - Continue IV Solu-Medrol 125 MG 3 times a day, scheduled DuoNeb's, Xopenex when necessary, antitussives, empiric azithromycin - D-dimer 0.62. Low index of suspicion for  PE. -patient was started on morphine gtt overnight. Plan is to transition to full comfort  Care.  -Appreciate Dr Neale Burly assistance.   SIRS - Likely related to acute respiratory failure rather than true sepsis. - Influenza panel PCR and RSV panel PCR negative. Lactate normalized. PCT Neg - discontinue azithromycin.    DM 2 - Last A1c: 6. Hold metformin. SSI.  Now uncontrolled, precipitated by steroids.  -on lantus.  Essential hypertension Continue with  Hydralazine PRN  Hypothyroid TSH 0.779 on 02/06/16. Continue Synthroid.  Stage II chronic kidney disease: Baseline creatinine 1-1.1. Presented with creatinine of 1.13. Stable.  Self-limiting nausea, vomiting and diarrhea Seems to have resolved. Monitor.  Microcytic anemia Repeat labs in am.        DVT prophylaxis: Lovenox.  Code Status: DNR  Family Communication: daughter at bedside.  Disposition Plan: to be determine    Consultants:   CCM  Palliative care.    Procedures:  BIPAP   Antimicrobials: azithromycin.    Subjective: Patient with FM oxygen. She open her eyes, she moan and close her eyes back. Appears comfortable.     Objective: Vitals:   01/16/2017 0445 01/18/2017 0505 12/27/2016 0510 01/13/2017 0515  BP:      Pulse:      Resp: (!) 46 (!) 29    Temp:      TempSrc:      SpO2: (!) 79% (!) 89% 90% 92%  Weight:      Height:        Intake/Output Summary (Last 24 hours) at 01/23/2017 1328 Last data filed at 01/05/2017 0600 Gross per 24 hour  Intake 171 ml  Output 850 ml  Net -679  ml   Filed Weights   12/27/16 0827 12/27/16 1959 12/29/16 0450  Weight: 83 kg (182 lb 15.7 oz) 89 kg (196 lb 3.4 oz) 82.9 kg (182 lb 12.2 oz)    Examination:  General exam:  Lethargic, appear peacefully  Respiratory system: Bilateral ronchus.  Cardiovascular system; S 1, S 2 RRR Gastrointestinal system: BS present, soft, nt Central nervous system: letahrgic Extremities: no edema Skin: No rashes, lesions  or ulcers     Data Reviewed: I have personally reviewed following labs and imaging studies  CBC: Recent Labs  Lab 12/30/16 0545 01/01/17 0533  WBC 14.3* 13.5*  HGB 10.5* 11.1*  HCT 34.2* 37.1  MCV 77.7* 78.8  PLT 338 337   Basic Metabolic Panel: Recent Labs  Lab 12/30/16 0545 12/30/16 2021 01/01/17 0533  NA 144 144 142  K 3.6 3.3* 4.3  CL 104 104 102  CO2 30 30 30   GLUCOSE 158* 259* 300*  BUN 28* 34* 37*  CREATININE 1.11* 1.27* 1.14*  CALCIUM 8.9 8.9 8.9   GFR: Estimated Creatinine Clearance: 49.1 mL/min (A) (by C-G formula based on SCr of 1.14 mg/dL (H)). Liver Function Tests: No results for input(s): AST, ALT, ALKPHOS, BILITOT, PROT, ALBUMIN in the last 168 hours. No results for input(s): LIPASE, AMYLASE in the last 168 hours. No results for input(s): AMMONIA in the last 168 hours. Coagulation Profile: No results for input(s): INR, PROTIME in the last 168 hours. Cardiac Enzymes: No results for input(s): CKTOTAL, CKMB, CKMBINDEX, TROPONINI in the last 168 hours. BNP (last 3 results) Recent Labs    03/01/16 1537  PROBNP 31.0   HbA1C: No results for input(s): HGBA1C in the last 72 hours. CBG: Recent Labs  Lab 01/01/17 1233 01/01/17 1505 01/01/17 1742 01/01/17 2141 01/02/17 0800  GLUCAP 218* 188* 187* 163* 231*   Lipid Profile: No results for input(s): CHOL, HDL, LDLCALC, TRIG, CHOLHDL, LDLDIRECT in the last 72 hours. Thyroid Function Tests: No results for input(s): TSH, T4TOTAL, FREET4, T3FREE, THYROIDAB in the last 72 hours. Anemia Panel: No results for input(s): VITAMINB12, FOLATE, FERRITIN, TIBC, IRON, RETICCTPCT in the last 72 hours. Sepsis Labs: No results for input(s): PROCALCITON, LATICACIDVEN in the last 168 hours.  Recent Results (from the past 240 hour(s))  Culture, blood (routine x 2) Call MD if unable to obtain prior to antibiotics being given     Status: None   Collection Time: 01/11/2017 10:55 PM  Result Value Ref Range Status    Specimen Description BLOOD RIGHT WRIST  Final   Special Requests   Final    BOTTLES DRAWN AEROBIC ONLY Blood Culture adequate volume   Culture NO GROWTH 5 DAYS  Final   Report Status 01/01/2017 FINAL  Final  Culture, blood (routine x 2) Call MD if unable to obtain prior to antibiotics being given     Status: None   Collection Time: 01/21/2017 11:07 PM  Result Value Ref Range Status   Specimen Description BLOOD RIGHT HAND  Final   Special Requests   Final    BOTTLES DRAWN AEROBIC AND ANAEROBIC Blood Culture adequate volume   Culture NO GROWTH 5 DAYS  Final   Report Status 01/01/2017 FINAL  Final  Respiratory Panel by PCR     Status: None   Collection Time: 12/27/2016 11:10 PM  Result Value Ref Range Status   Adenovirus NOT DETECTED NOT DETECTED Final   Coronavirus 229E NOT DETECTED NOT DETECTED Final   Coronavirus HKU1 NOT DETECTED NOT DETECTED Final  Coronavirus NL63 NOT DETECTED NOT DETECTED Final   Coronavirus OC43 NOT DETECTED NOT DETECTED Final   Metapneumovirus NOT DETECTED NOT DETECTED Final   Rhinovirus / Enterovirus NOT DETECTED NOT DETECTED Final   Influenza A NOT DETECTED NOT DETECTED Final   Influenza B NOT DETECTED NOT DETECTED Final   Parainfluenza Virus 1 NOT DETECTED NOT DETECTED Final   Parainfluenza Virus 2 NOT DETECTED NOT DETECTED Final   Parainfluenza Virus 3 NOT DETECTED NOT DETECTED Final   Parainfluenza Virus 4 NOT DETECTED NOT DETECTED Final   Respiratory Syncytial Virus NOT DETECTED NOT DETECTED Final   Bordetella pertussis NOT DETECTED NOT DETECTED Final   Chlamydophila pneumoniae NOT DETECTED NOT DETECTED Final   Mycoplasma pneumoniae NOT DETECTED NOT DETECTED Final  MRSA PCR Screening     Status: None   Collection Time: 12/27/16  8:23 AM  Result Value Ref Range Status   MRSA by PCR NEGATIVE NEGATIVE Final    Comment:        The GeneXpert MRSA Assay (FDA approved for NASAL specimens only), is one component of a comprehensive MRSA  colonization surveillance program. It is not intended to diagnose MRSA infection nor to guide or monitor treatment for MRSA infections.          Radiology Studies: No results found.      Scheduled Meds: . methylPREDNISolone (SOLU-MEDROL) injection  125 mg Intravenous TID  . potassium chloride  20 mEq Oral Once   Continuous Infusions: . sodium chloride 10 mL/hr at 12/29/16 0500  . morphine 2 mg/hr (01/07/2017 1133)     LOS: 8 days    Time spent: 35 minutes.     Alba Coryegalado, Journee Bobrowski A, MD [facility] Pager [phone]  If 7PM-7AM, please contact night-coverage [email or web address] Password [email or web address] 01/14/2017, 1:28 PM

## 2017-01-25 NOTE — Progress Notes (Signed)
01/21/2017  Morphine 250 mg /22650ml wasted 200ml  By Lovie MacadamiaNadine Rohail Klees RN and Chrisandra Nettersana Green RN at [phone].

## 2017-01-25 NOTE — Death Summary Note (Signed)
Death Summary  Kim [name] JXB:[id number] DOB: 01-03-1941 DOA: 01/09/2017  PCP: Renaye RakersBland, Veita, MD PCP/Office notified: Dr wert.   Admit date: 01/05/2017 Date of Death: 01/01/2017  Final Diagnoses:  Principal Problem:   Acute on chronic respiratory failure with hypoxia (HCC) Active Problems:   HTN (hypertension)   Hypothyroidism   Sepsis (HCC)   Chronic kidney disease (CKD), stage II (mild)   Nausea vomiting and diarrhea   Type II diabetes mellitus with renal manifestations (HCC)   Encounter for palliative care     History of present illness:   Brief Narrative: 76 year old female, lives with family, independent of activities, PMH of severe ILD, chronic hypoxic respiratory failure >oxygen dependent on 2 L oxygen at rest and 6 L with activity & steroid dependent (Dr. Sherene SiresWert, pulmonology), HTN, HLD, GERD, DM, hypothyroid, stage II chronic kidney disease reports progressively worsening dyspnea over the past couple of days, chronic cough with intermittent clear sputum which is not changed, chills without fevers and no chest pains. Denies sick contacts at home. Self-limiting nausea, vomiting and diarrhea 2 days ago. In the ED hypoxic in the 60s on 2 L. Has been on BiPAP since ED arrival 11/1. Chest x-ray shows pulmonary fibrosis, BNP normal, RSV panel negative. Admitted to stepdown for acute on chronic hypoxic respiratory failure due to pulmonary fibrosis flare. Pulmonology consulted.  Assessment & Plan:   Principal Problem:   Acute on chronic respiratory failure with hypoxia (HCC) Active Problems:   HTN (hypertension)   Hypothyroidism   Sepsis (HCC)   Chronic kidney disease (CKD), stage II (mild)   Nausea vomiting and diarrhea   Type II diabetes mellitus with renal manifestations (HCC)   Encounter for palliative care      Hospital Course:  Patient was admitted with worsening respiratory failure, related to end stage pulmonary fibrosis,. She was treated aggressively with IV  steroids, IV antibiotics, IV lasix. She fail to improved with medical care. She was evaluated by pulmonologist and Palliative care. Patient and family decide to proceed with full comfort care. She was started on morphine Gtt. Patient died 02-May-2016 at 13;45 PM.   Acute on chronic hypoxic respiratory failure secondary to severe/end-stage pulmonary fibrosis flare - At baseline patient is oxygen dependent (2L at rest and 6L with activity) and steroid dependent (15 mg prednisone daily). - Low index of suspicion for infectious etiology. No fever, only mild leukocytosis, RSV panel negative, chest x-ray shows chronic interstitial lung disease without pneumonia. Blood cultures negative to date - Continue IV Solu-Medrol 125 MG 3 times a day, scheduled DuoNeb's, Xopenex when necessary, antitussives, empiric azithromycin - D-dimer 0.62. Low index of suspicion for PE. -patient was started on morphine gtt overnight. Plan is to transition to full comfort  Care.  -Appreciate Dr Neale BurlyFreeman assistance.   SIRS - Likely related to acute respiratory failure rather than true sepsis. - Influenza panel PCR and RSV panel PCR negative. Lactate normalized. PCT Neg - discontinue azithromycin.   DM 2 - Last A1c: 6. Hold metformin. SSI. Now uncontrolled, precipitated by steroids.  -on lantus.  Essential hypertension Continue with  Hydralazine PRN  Hypothyroid TSH 0.779 on 02/06/16. Was on Synthroid.  Stage II chronic kidney disease: Baseline creatinine 1-1.1. Presented with creatinine of 1.13. Stable.  Self-limiting nausea, vomiting and diarrhea Seems to have resolved. Monitor.  Microcytic anemia       Time: 13:46PM  Signed:  Hartley BarefootRegalado, Srijan Givan A  [facility] 01/02/2017, 2:11 PM

## 2017-01-25 NOTE — Progress Notes (Signed)
01/04/2017 Spoke to daughter Deborra MedinaCassandra Goins on 01/14/2017 about earring that was take out her mom ears. Earrings was placed in lost and found. Per daughter will come by and get it on Saturday. [facility] PaNadine Jamaris Theard RN.

## 2017-01-25 NOTE — Progress Notes (Signed)
Palliative Medicine RN Note:  Symptom check. Pt has gotten 14mg  of prn morphine in 24 hours; discussed with Dr Neale BurlyFreeman prior to my visit. She is currently relaxed with easy breathing. She is not awake during my visit, and I did not wake her, as she is agitated when awake. Son, daughter at bedside.   Discussed with family an increase in basal rate of morphine due to frequency of prn doses. They verbalize understanding and agreement. Family asked about the need for continued interventions such as antibiotics if she is dying. I offered strict comfort care and cessation of any therapy that is not focused on comfort, and both son and daughter agree that is what they want.  Pulmonary present; they will sign off. Called Dr Neale BurlyFreeman to discuss. Comfort orders obtained. Plan for follow up this afternoon by PMT member.  Margret ChanceMelanie G. Mileydi Milsap, RN, BSN, [facility] 01/13/2017 11:22 AM Cell [phone] 8:00-4:00 Monday-Friday Office [phone]

## 2017-01-25 DEATH — deceased

## 2018-11-04 IMAGING — CT CT CHEST HIGH RESOLUTION W/O CM
2 of 5 series · 15 of 36 positions shown, 18 images · non-contrast
Comparison: Chest CT 01/25/2016.

CLINICAL DATA: 75-year-old female with history of interstitial lung
disease. Followup study.

EXAM:
CT CHEST WITHOUT CONTRAST
TECHNIQUE: Multidetector CT imaging of the chest was performed following the
standard protocol without intravenous contrast. High resolution
imaging of the lungs, as well as inspiratory and expiratory imaging,
was performed.

[Series 4: high resolution 2.0 b30f · axial · 0.56mm/px · z∈[+1182,+1418]mm · 12 of 132 slices shown, 15 images]
[im 7/132  mediastinal]
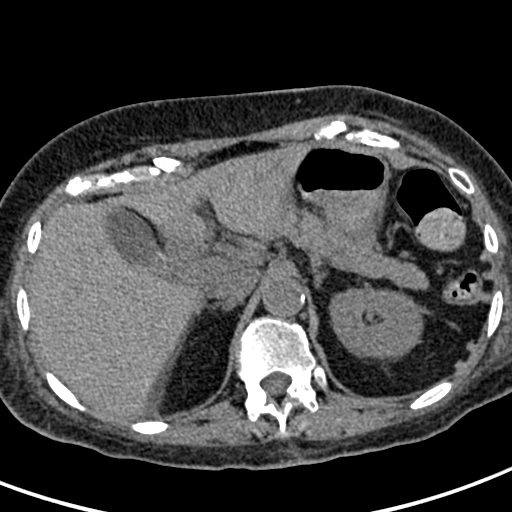
[im 7/132  lung]
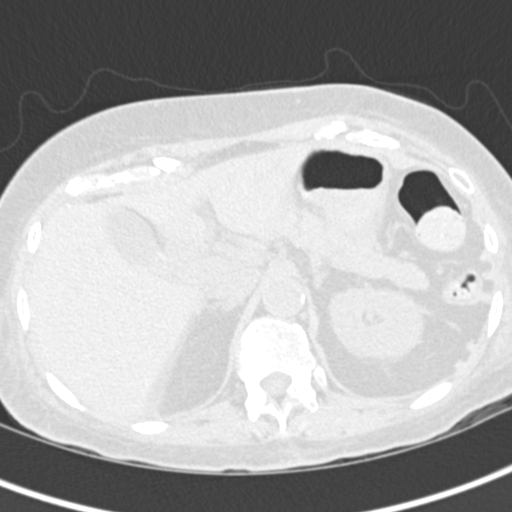
[im 19/132  lung]
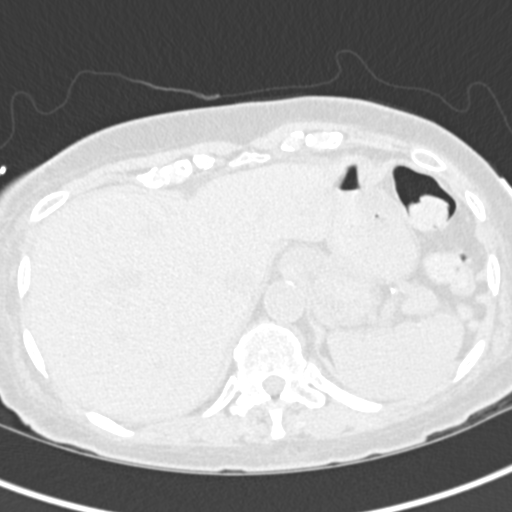
[im 32/132  lung]
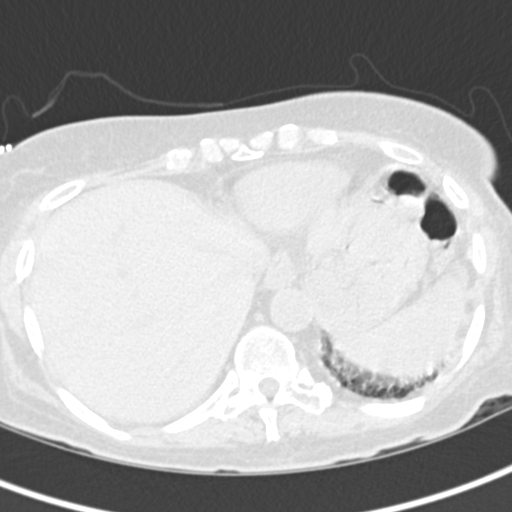
[im 38/132  lung]
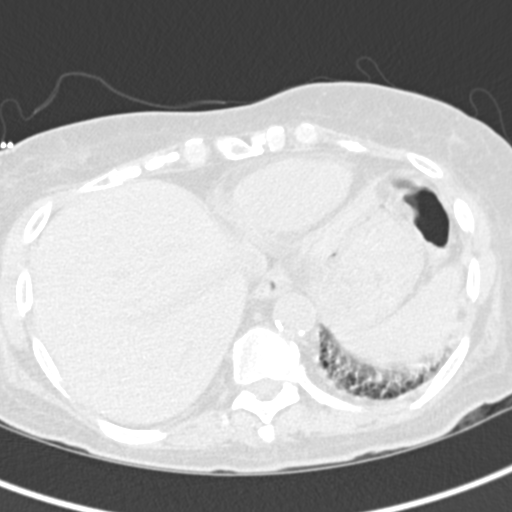
[im 50/132  mediastinal]
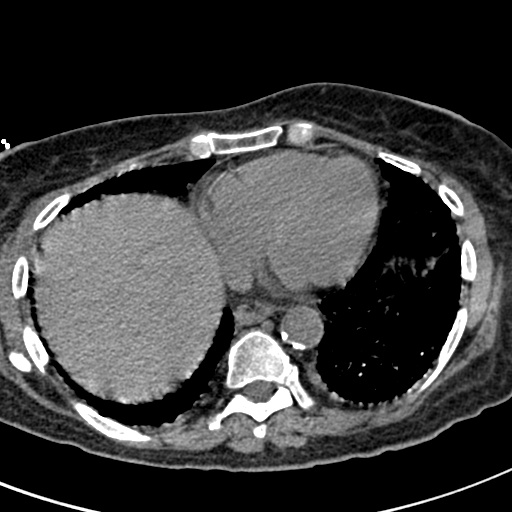
[im 50/132  lung]
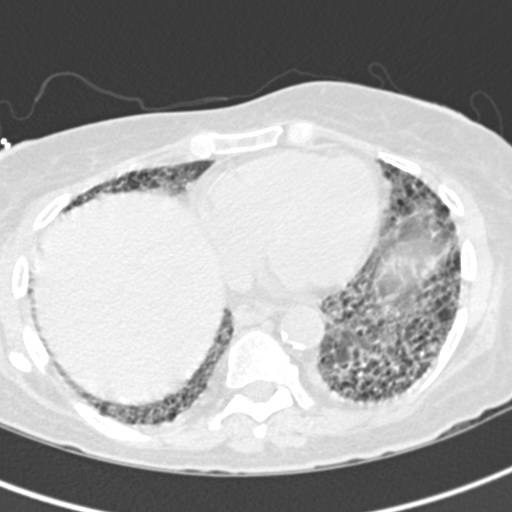
[im 63/132  lung]
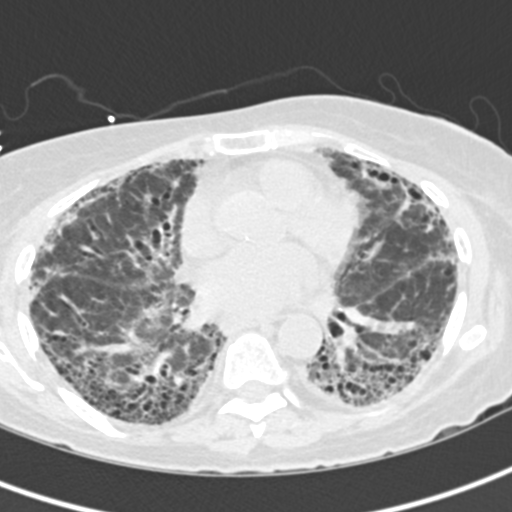
[im 69/132  lung]
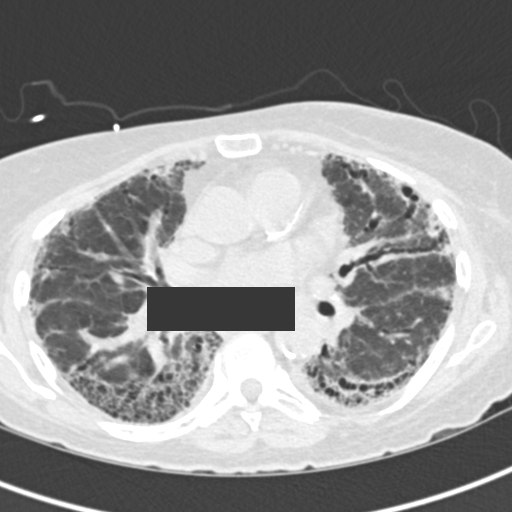
[im 82/132  lung]
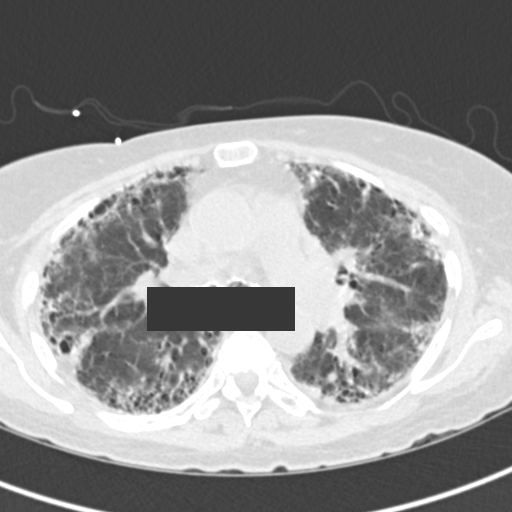
[im 94/132  mediastinal]
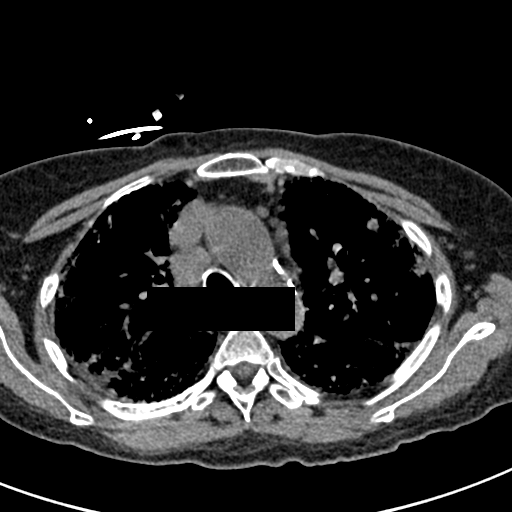
[im 94/132  lung]
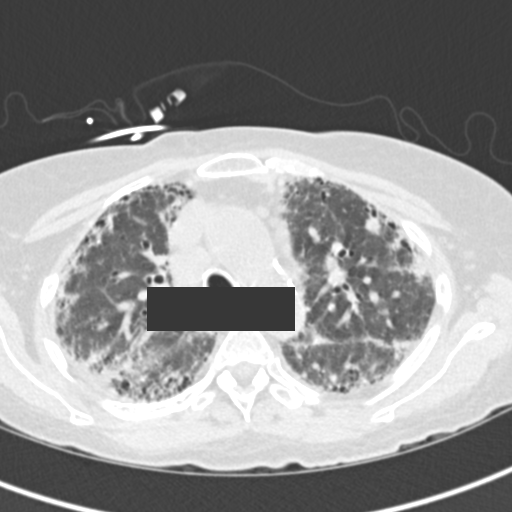
[im 100/132  lung]
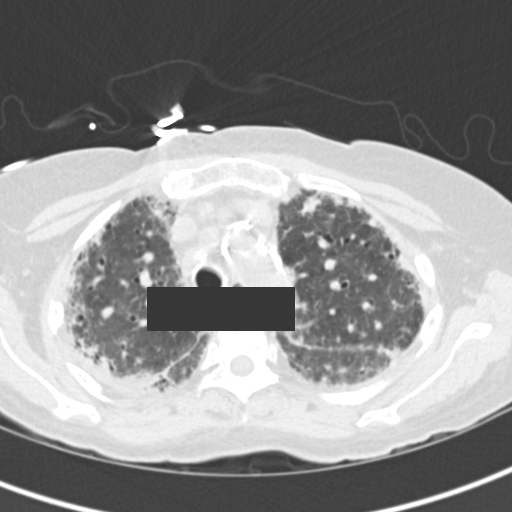
[im 113/132  lung]
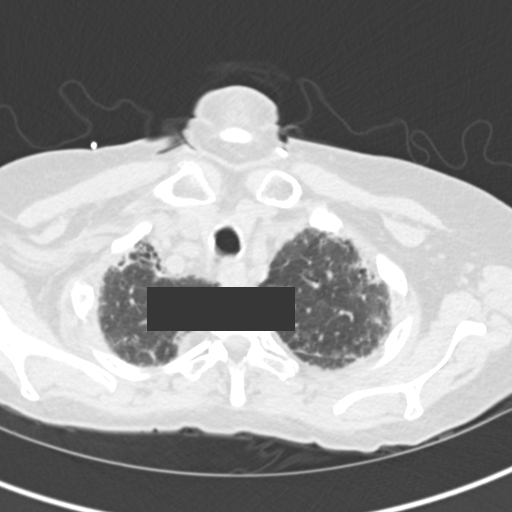
[im 125/132  lung]
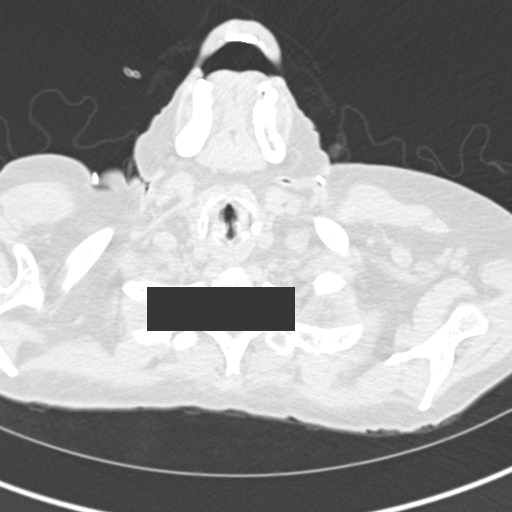

[Series 7: coronal · coronal · 0.52mm/px · 3 of 101 slices shown]
[im 21/101  lung]
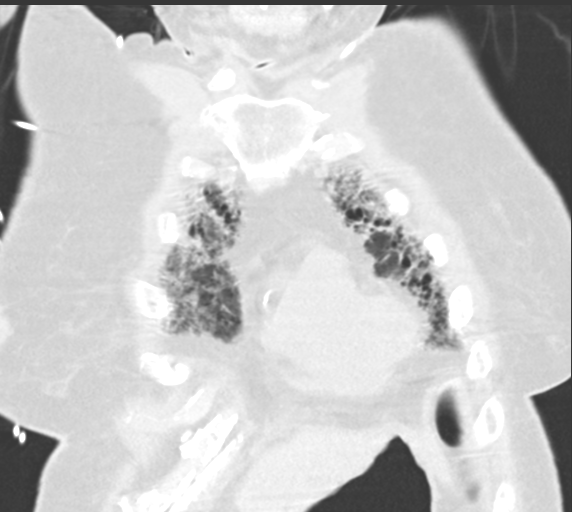
[im 41/101  lung]
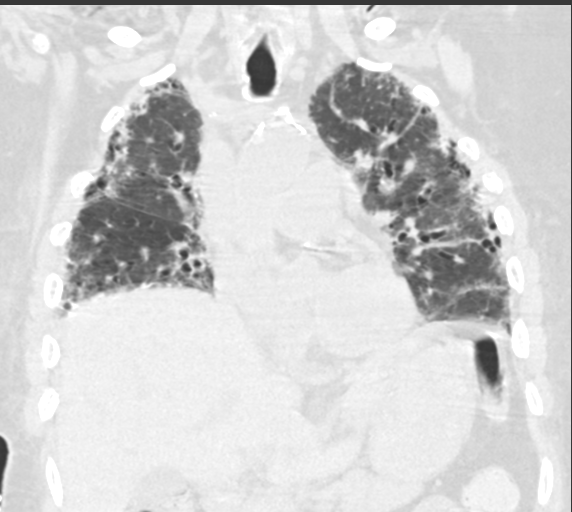
[im 61/101  lung]
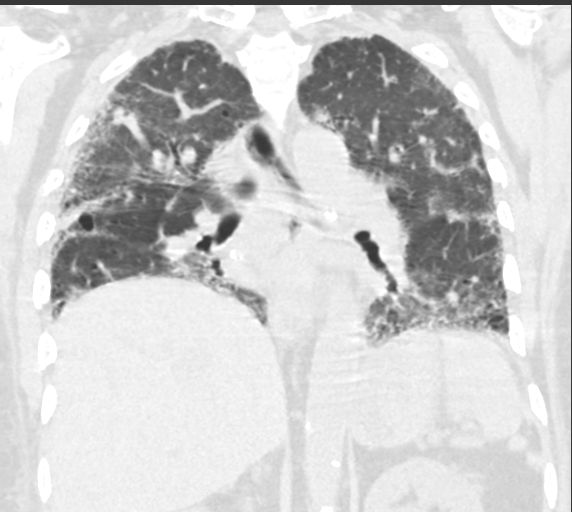

[15 of 36 positions shown; findings below may reference images not displayed]

FINDINGS: Cardiovascular: Heart size is normal. There is no significant
pericardial fluid, thickening or pericardial calcification. There is
aortic atherosclerosis, as well as atherosclerosis of the great
vessels of the mediastinum and the coronary arteries, including
calcified atherosclerotic plaque in the left main, left anterior
descending, left circumflex and right coronary arteries.

Mediastinum/Nodes: There are multiple borderline enlarged and
enlarged mediastinal and bilateral hilar lymph nodes, similar to
prior examinations, measuring up to 17 mm in short axis in the right
paratracheal nodal station. These are presumably chronic and
reactive in the setting of interstitial lung disease. Esophagus is
unremarkable in appearance. No axillary lymphadenopathy.

Lungs/Pleura: High-resolution images again demonstrate widespread
areas of thickening of the peribronchovascular interstitium,
subpleural reticulation, parenchymal banding, traction
bronchiectasis and extensive honeycombing. These findings have a
definitive craniocaudal gradient. These findings appear relatively
stable compared to the most recent prior study, but appear
progressed when compared to more remote prior examinations, and are
considered diagnostic from an imaging standpoint of usual
interstitial pneumonia (UIP). Inspiratory and expiratory imaging is
unremarkable. There is no acute consolidative airspace disease and
no pleural effusions. No definite suspicious appearing pulmonary
nodules or masses are noted.

Upper Abdomen: Aortic atherosclerosis.

Musculoskeletal: There are no aggressive appearing lytic or blastic
lesions noted in the visualized portions of the skeleton.
IMPRESSION: 1. The appearance of the lungs remains compatible with interstitial
lung disease, and findings are considered diagnostic from an imaging
standpoint of usual interstitial pneumonia (UIP).
2. Aortic atherosclerosis, in addition to left main and 3 vessel
coronary artery disease. Please note that although the presence of
coronary artery calcium documents the presence of coronary artery
disease, the severity of this disease and any potential stenosis
cannot be assessed on this non-gated CT examination. Assessment for
potential risk factor modification, dietary therapy or pharmacologic
therapy may be warranted, if clinically indicated.
